# Patient Record
Sex: Female | Born: 1960
Health system: Southern US, Community
[De-identification: ages and names within clinical notes are randomized; demographics above are authoritative.]

## PROBLEM LIST (undated history)

## (undated) DIAGNOSIS — I10 Essential (primary) hypertension: Secondary | ICD-10-CM

## (undated) DIAGNOSIS — J45909 Unspecified asthma, uncomplicated: Secondary | ICD-10-CM

## (undated) DIAGNOSIS — G839 Paralytic syndrome, unspecified: Secondary | ICD-10-CM

## (undated) DIAGNOSIS — Z789 Other specified health status: Secondary | ICD-10-CM

## (undated) DIAGNOSIS — Z8489 Family history of other specified conditions: Secondary | ICD-10-CM

## (undated) HISTORY — DX: Essential (primary) hypertension: I10

## (undated) HISTORY — PX: BACK SURGERY: SHX140

---

## 1998-03-17 ENCOUNTER — Encounter: Admission: RE | Admit: 1998-03-17 | Discharge: 1998-03-17 | Payer: Self-pay | Admitting: Family Medicine

## 1999-09-07 ENCOUNTER — Other Ambulatory Visit: Admission: RE | Admit: 1999-09-07 | Discharge: 1999-09-07 | Payer: Self-pay | Admitting: Obstetrics and Gynecology

## 2000-08-14 ENCOUNTER — Other Ambulatory Visit: Admission: RE | Admit: 2000-08-14 | Discharge: 2000-08-14 | Payer: Self-pay | Admitting: Obstetrics and Gynecology

## 2000-08-14 ENCOUNTER — Encounter (INDEPENDENT_AMBULATORY_CARE_PROVIDER_SITE_OTHER): Payer: Self-pay | Admitting: Specialist

## 2002-07-02 ENCOUNTER — Other Ambulatory Visit: Admission: RE | Admit: 2002-07-02 | Discharge: 2002-07-02 | Payer: Self-pay | Admitting: Obstetrics and Gynecology

## 2003-07-08 ENCOUNTER — Other Ambulatory Visit: Admission: RE | Admit: 2003-07-08 | Discharge: 2003-07-08 | Payer: Self-pay | Admitting: Obstetrics and Gynecology

## 2003-08-04 ENCOUNTER — Ambulatory Visit (HOSPITAL_COMMUNITY): Admission: RE | Admit: 2003-08-04 | Discharge: 2003-08-04 | Payer: Self-pay | Admitting: Obstetrics and Gynecology

## 2003-08-04 ENCOUNTER — Encounter: Payer: Self-pay | Admitting: Obstetrics and Gynecology

## 2004-05-20 ENCOUNTER — Encounter: Admission: RE | Admit: 2004-05-20 | Discharge: 2004-08-18 | Payer: Self-pay | Admitting: Internal Medicine

## 2005-01-05 ENCOUNTER — Other Ambulatory Visit: Admission: RE | Admit: 2005-01-05 | Discharge: 2005-01-05 | Payer: Self-pay | Admitting: Obstetrics and Gynecology

## 2005-08-22 ENCOUNTER — Ambulatory Visit (HOSPITAL_COMMUNITY): Admission: RE | Admit: 2005-08-22 | Discharge: 2005-08-22 | Payer: Self-pay | Admitting: Internal Medicine

## 2005-08-22 ENCOUNTER — Ambulatory Visit: Payer: Self-pay | Admitting: Internal Medicine

## 2005-08-23 ENCOUNTER — Ambulatory Visit: Payer: Self-pay | Admitting: Internal Medicine

## 2005-09-11 ENCOUNTER — Ambulatory Visit (HOSPITAL_COMMUNITY): Admission: RE | Admit: 2005-09-11 | Discharge: 2005-09-11 | Payer: Self-pay | Admitting: *Deleted

## 2005-10-13 ENCOUNTER — Ambulatory Visit: Payer: Self-pay | Admitting: Internal Medicine

## 2005-11-03 ENCOUNTER — Ambulatory Visit: Payer: Self-pay | Admitting: Internal Medicine

## 2006-01-08 ENCOUNTER — Other Ambulatory Visit: Admission: RE | Admit: 2006-01-08 | Discharge: 2006-01-08 | Payer: Self-pay | Admitting: Obstetrics and Gynecology

## 2006-04-04 ENCOUNTER — Ambulatory Visit: Payer: Self-pay | Admitting: Family Medicine

## 2006-12-10 ENCOUNTER — Ambulatory Visit: Payer: Self-pay | Admitting: Internal Medicine

## 2007-01-18 ENCOUNTER — Ambulatory Visit: Payer: Self-pay | Admitting: Internal Medicine

## 2007-07-19 ENCOUNTER — Ambulatory Visit: Payer: Self-pay | Admitting: Internal Medicine

## 2007-08-02 ENCOUNTER — Encounter: Payer: Self-pay | Admitting: *Deleted

## 2007-08-02 DIAGNOSIS — G43909 Migraine, unspecified, not intractable, without status migrainosus: Secondary | ICD-10-CM | POA: Insufficient documentation

## 2008-01-08 ENCOUNTER — Encounter (INDEPENDENT_AMBULATORY_CARE_PROVIDER_SITE_OTHER): Payer: Self-pay | Admitting: *Deleted

## 2008-01-20 ENCOUNTER — Encounter: Admission: RE | Admit: 2008-01-20 | Discharge: 2008-01-20 | Payer: Self-pay | Admitting: Obstetrics and Gynecology

## 2008-03-23 ENCOUNTER — Ambulatory Visit: Payer: Self-pay | Admitting: Internal Medicine

## 2008-03-23 DIAGNOSIS — R209 Unspecified disturbances of skin sensation: Secondary | ICD-10-CM | POA: Insufficient documentation

## 2008-03-23 DIAGNOSIS — G5600 Carpal tunnel syndrome, unspecified upper limb: Secondary | ICD-10-CM | POA: Insufficient documentation

## 2008-07-24 ENCOUNTER — Ambulatory Visit: Payer: Self-pay | Admitting: Internal Medicine

## 2008-07-24 DIAGNOSIS — L659 Nonscarring hair loss, unspecified: Secondary | ICD-10-CM | POA: Insufficient documentation

## 2008-07-24 DIAGNOSIS — R030 Elevated blood-pressure reading, without diagnosis of hypertension: Secondary | ICD-10-CM | POA: Insufficient documentation

## 2009-09-09 ENCOUNTER — Ambulatory Visit: Payer: Self-pay | Admitting: Internal Medicine

## 2009-09-09 DIAGNOSIS — M25519 Pain in unspecified shoulder: Secondary | ICD-10-CM | POA: Insufficient documentation

## 2009-10-26 ENCOUNTER — Ambulatory Visit: Payer: Self-pay | Admitting: Sports Medicine

## 2009-10-29 ENCOUNTER — Telehealth: Payer: Self-pay | Admitting: Internal Medicine

## 2009-11-09 ENCOUNTER — Ambulatory Visit: Payer: Self-pay | Admitting: Sports Medicine

## 2009-11-09 DIAGNOSIS — M7500 Adhesive capsulitis of unspecified shoulder: Secondary | ICD-10-CM | POA: Insufficient documentation

## 2009-12-01 ENCOUNTER — Ambulatory Visit: Payer: Self-pay | Admitting: Sports Medicine

## 2010-01-04 ENCOUNTER — Ambulatory Visit: Payer: Self-pay | Admitting: Sports Medicine

## 2010-01-31 ENCOUNTER — Ambulatory Visit: Payer: Self-pay | Admitting: Sports Medicine

## 2010-03-07 ENCOUNTER — Ambulatory Visit: Payer: Self-pay | Admitting: Sports Medicine

## 2010-12-29 NOTE — Assessment & Plan Note (Signed)
Summary: F/U,[facility]   Vital Signs:  Patient profile:   50 year old female BP sitting:   115 / 79  Vitals Entered By: Lillia Pauls CMA (March 07, 2010 8:41 AM)  History of Present Illness: Morning left shoulder is still stiff Takes tramadol - usually 1 daily/ on bad days 2 now w good movement now lifting easy weights no night pain now No interference w ADLs  Allergies: 1)  ! Stadol (Butorphanol Tartrate) 2)  ! Tetracycline Hcl (Tetracycline Hcl) 3)  ! Propranolol Hcl (Propranolol Hcl) 4)  ! Maxzide-25 (Triamterene-Hctz) 5)  ! Tetracycline Hcl (Tetracycline Hcl)  Physical Exam  General:  Well-developed,well-nourished,in no acute distress; alert,appropriate and cooperative throughout examination Msk:  Left Shoulder Inspection reveals no abnormalities or assymetry; no atrophy noted; palpation is unremarkable;  ROM is full in all planes. specific strength testing of Rotator cuff mm reveals good strength throughout; no signs of impingement; speeds and yergason's tests normal;  no labral pathology noted; norm scapular function observed.  negative painful arc and no drop arm sign.  only motio limitation  is back scratch is 1 vert less on left slight impingemtn on hawkins    Impression & Recommendations:  Problem # 1:  SHOULDER PAIN (ICD-719.41)  Her updated medication list for this problem includes:    Tramadol Hcl 50 Mg Tabs (Tramadol hcl) .Marland Kitchen... 1 by mouth every 6 hours as needed pain    Zipsor 25 Mg Caps (Diclofenac potassium) .Marland Kitchen... 1 by mouth two times a day pc as needed pain  this is down to 4/10 on worst days and most times 1/10  Problem # 2:  ADHESIVE CAPSULITIS, LEFT (ICD-726.0) she has resolved this and now has good movement  keep up exercises at least 3x week  as needed clinic return  Complete Medication List: 1)  Benicar 20 Mg Tabs (Olmesartan medoxomil) .... 1/2 once daily 2)  Vitamin D3 1000 Unit Tabs (Cholecalciferol) .Marland Kitchen.. 1 by mouth daily 3)  Tramadol Hcl 50  Mg Tabs (Tramadol hcl) .Marland Kitchen.. 1 by mouth every 6 hours as needed pain 4)  Zipsor 25 Mg Caps (Diclofenac potassium) .Marland Kitchen.. 1 by mouth two times a day pc as needed pain 5)  Bcp  .... Once daily 6)  Amitriptyline Hcl 10 Mg Tabs (Amitriptyline hcl) .Marland Kitchen.. 1-2 tabs by mouth qhs

## 2010-12-29 NOTE — Assessment & Plan Note (Signed)
Summary: shoulder ultra sound,mc   Vital Signs:  Patient profile:   50 year old female BP sitting:   108 / 70  Vitals Entered By: Lillia Pauls CMA (November 09, 2009 8:45 AM)  History of Present Illness: Pt presents for follow-up of her left shoulder pain. She has been able to do the ROM exercises but has found that they have slowly become painful. She still has pain at night and the shoulder is often throbbing. She does believe that the Tramadol is helping her pain but she is only taking it twice a day. She still has difficulty with overhead activities. She is here for the ultrasound to assess for a rotator cuff tear.   Allergies: 1)  ! Stadol (Butorphanol Tartrate) 2)  ! Tetracycline Hcl (Tetracycline Hcl) 3)  ! Propranolol Hcl (Propranolol Hcl) 4)  ! Maxzide-25 (Triamterene-Hctz) 5)  ! Tetracycline Hcl (Tetracycline Hcl)  Physical Exam  General:  alert, well-developed, and well-nourished.   Head:  normocephalic and atraumatic.   Msk:  Left Shoulder: No bruising, edema or bony abnormalities + TTP over the anterior shoulder but not over the [facility] joint Decreased forward flexion and abduction to only 105 degrees  Significant pain with empty can testing and drops arm Decreased strength with external rotation but good strength with resisted internal rotation + Neer's, + Cross over, + Hawkin's Able to put her hand up to her belt behind her back  Right Shoulder: No bony abnormalities, edema or bruising full ROM with forward flexion and abduction No TTP throughout Normal empty can, resisted internal and external rotation Can put her hand behind her back easily up to her upper lumbar spine Additional Exam:  U/S of her left shoulder showed the following 1. Possible small tear in supraspinatus which is not a complete tear 2. Teres minor, infraspinatus and subscapularis are intact 3. Subacromial bursitis with fluid 4. Normal AC joint 5. Mild impingment on dynamic testing 6. No biceps  pathology 7. Decreased motion of the supraspinatus during dynamic testing concerning for a frozen shoulder Images saved for documentation.   Impression & Recommendations:  Problem # 1:  SHOULDER PAIN (ICD-719.41) Assessment Unchanged  Concerning for decreased motion of supraspinatus and possible developing frozen shoulder. Patient consented for another steroid injection (subacromial) into the left shoulder  Consent obtained and verified. Sterile betadine prep. Furthur cleansed with alcohol. Topical analgesic spray: Ethyl chloride. Joint: Left shoulder Approached in typical fashion with: subacromial Completed without difficulty Meds:40 mg of kenalog and 7cc of 1% lidocaine Needle:25 guage Aftercare instructions and Red flags advised.  Continue gentle ROM exercises at home. Can continue tramadol but increase to up to 4 times per day for pain control. Return in 4-6 weeks for follow-up   Her updated medication list for this problem includes:    Tramadol Hcl 50 Mg Tabs (Tramadol hcl) .Marland Kitchen... 1 by mouth two times a day as needed pain    Zipsor 25 Mg Caps (Diclofenac potassium) .Marland Kitchen... 1 by mouth two times a day pc as needed pain    Her updated medication list for this problem includes:    Tramadol Hcl 50 Mg Tabs (Tramadol hcl) .Marland Kitchen... 1 by mouth two times a day as needed pain    Zipsor 25 Mg Caps (Diclofenac potassium) .Marland Kitchen... 1 by mouth two times a day pc as needed pain  Orders: US EXTREMITY NON-VASC REAL-TIME IMG (29528) Joint Aspirate / Injection, Large (41324) Kenalog 10 mg inj (M0102)  Problem # 2:  ADHESIVE CAPSULITIS, LEFT (ICD-726.0)  Marked limitation in at least 2 planes of motion no complete RC tear although she prob does have incomplete SST on left  I would progress her with serial injections q 3mow ROM exercises until improved and little night pain after that start some strength work and formal PT  Orders: US EXTREMITY NON-VASC REAL-TIME IMG (82956) Joint Aspirate  / Injection, Large (21308) Kenalog 10 mg inj (J3301)  Complete Medication List: 1)  Benicar 20 Mg Tabs (Olmesartan medoxomil) .... 1/2 once daily 2)  Vitamin D3 1000 Unit Tabs (Cholecalciferol) .Marland Kitchen.. 1 by mouth daily 3)  Tramadol Hcl 50 Mg Tabs (Tramadol hcl) .Marland Kitchen.. 1 by mouth two times a day as needed pain 4)  Zipsor 25 Mg Caps (Diclofenac potassium) .Marland Kitchen.. 1 by mouth two times a day pc as needed pain 5)  Bcp  .... Once daily

## 2010-12-29 NOTE — Miscellaneous (Signed)
Summary: Shoulder Injection/[facility]  Shoulder Injection/[facility]   Imported By: Sherian Rein 09/14/2009 14:02:50  _____________________________________________________________________  External Attachment:    Type:   Image     Comment:   External Document

## 2010-12-29 NOTE — Assessment & Plan Note (Signed)
Summary: SHOULDER PAIN/[facility]   Vital Signs:  Patient profile:   50 year old female Height:      61 inches Weight:      114 pounds BMI:     21.62 Temp:     98.5 degrees F oral Pulse rate:   73 / minute BP sitting:   96 / 64  (left arm)  Vitals Entered By: Tora Perches (September 09, 2009 4:19 PM)  Procedure Note  Injections: The patient complains of pain and inflammation. Consent signed: yes  Procedure # 1: joint injection    Region: lateral    Location: L shoulder    Technique: 24 g needle    Medication: 40 mg depomedrol    Anesthesia: 2.0 ml 1% lidocaine w/o epinephrine    Comment: Risks including but not limited by incomplete procedure, bleeding, infection, recurrence were discussed with the patient. Consent form was signed.   Cleaned and prepped with: alcohol and betadine Wound dressing: bandaid Instructions: ice Additional Instructions: Tol well, compl none, pain relieved.  CC: shoulder pain Is Patient Diabetic? No   CC:  shoulder pain.  History of Present Illness: C/o B shoulder pain L>>R x  6+ months  Pain is worse w/ROM - severe at times  Preventive Screening-Counseling & Management  Alcohol-Tobacco     Smoking Status: never  Current Medications (verified): 1)  Benicar 20 Mg  Tabs (Olmesartan Medoxomil) .... 1/2 Once Daily 2)  Bcp .... Once Daily 3)  Vitamin D3 1000 Unit  Tabs (Cholecalciferol) .Marland Kitchen.. 1 By Mouth Daily 4)  Cbc, Esr, Tsh, Cmet, Ua, Lipids 704.0 401.1 .... Thanks!  Allergies: 1)  ! Stadol (Butorphanol Tartrate) 2)  ! Tetracycline Hcl (Tetracycline Hcl) 3)  ! Propranolol Hcl (Propranolol Hcl) 4)  ! Maxzide-25 (Triamterene-Hctz) 5)  ! Tetracycline Hcl (Tetracycline Hcl)  Past History:  Past Medical History: Last updated: 03/23/2008 Hypertension  Social History: Last updated: 03/23/2008 Occupation: ICU secr. Married Never Smoked Regular exercise-yes  Review of Systems  The patient denies fever, dyspnea on exertion, and  abdominal pain.    Physical Exam  General:  Well-developed,well-nourished,in no acute distress; alert,appropriate and cooperative throughout examination Mouth:  Oral mucosa and oropharynx without lesions or exudates.  Teeth in good repair. Lungs:  Normal respiratory effort, chest expands symmetrically. Lungs are clear to auscultation, no crackles or wheezes. Heart:  Normal rate and regular rhythm. S1 and S2 normal without gallop, murmur, click, rub or other extra sounds. Msk:  L shoulder in subacr area tender to palp and w/ROM Neurologic:  No cranial nerve deficits noted. Station and gait are normal. Plantar reflexes are down-going bilaterally. DTRs are symmetrical throughout. Sensory, motor and coordinative functions appear intact. Skin:  Intact without suspicious lesions or rashes   Impression & Recommendations:  Problem # 1:  SHOULDER PAIN (ICD-719.41) L Assessment Deteriorated  The following medications were removed from the medication list:    Ibuprofen 600 Mg Tabs (Ibuprofen) .Marland Kitchen... As needed    Darvocet A500 100-500 Mg Tabs (Propoxyphene n-apap) ..... Once daily    Meloxicam 15 Mg Tabs (Meloxicam) .Marland Kitchen... 1/2 or one by mouth daily Her updated medication list for this problem includes:    Tramadol Hcl 50 Mg Tabs (Tramadol hcl) .Marland Kitchen... 1 by mouth two times a day as needed pain    Zipsor 25 Mg Caps (Diclofenac potassium) .Marland Kitchen... 1 by mouth two times a day pc as needed pain  Complete Medication List: 1)  Benicar 20 Mg Tabs (Olmesartan medoxomil) .... 1/2 once daily  2)  Vitamin D3 1000 Unit Tabs (Cholecalciferol) .Marland Kitchen.. 1 by mouth daily 3)  Tramadol Hcl 50 Mg Tabs (Tramadol hcl) .Marland Kitchen.. 1 by mouth two times a day as needed pain 4)  Zipsor 25 Mg Caps (Diclofenac potassium) .Marland Kitchen.. 1 by mouth two times a day pc as needed pain 5)  Bcp  .... Once daily  Other Orders: Joint Aspirate / Injection, Large (20610) Depo- Medrol 40mg  (J1030)  Patient Instructions: 1)  Zipsor 25 mg two times a day  2)   Use stretching exercises that I have provided (15 min. or longer every day) 3)  Call if you are not better in a reasonable amount of time or if worse.  Prescriptions: ZIPSOR 25 MG CAPS (DICLOFENAC POTASSIUM) 1 by mouth two times a day pc as needed pain  #60 x 1   Entered and Authorized by:   Tresa Garter MD   Signed by:   Tresa Garter MD on 09/09/2009   Method used:   Print then Give to Patient   RxID:   [id number] TRAMADOL HCL 50 MG  TABS (TRAMADOL HCL) 1 by mouth two times a day as needed pain  #60 x 2   Entered and Authorized by:   Tresa Garter MD   Signed by:   Tresa Garter MD on 09/09/2009   Method used:   Print then Give to Patient   RxID:   [id number]

## 2010-12-29 NOTE — Assessment & Plan Note (Signed)
Summary: [facility]   Vital Signs:  Patient profile:   50 year old female BP sitting:   141 / 100  Vitals Entered By: Lillia Pauls CMA (October 26, 2009 11:51 AM)  History of Present Illness: Pt presents with left shoulder pain. She recently saw her PCP on 09/09/2009 and had a steroid injection which did not help her. She has had pain now since September and has been limited in her movements especially with putting her left arm behind her back or above her head. She has pain at night if she tries to sleep on her left shoulder. She was given a prescription for diclofenac potassium which was helpful as well as for tramadol but she did not keep the tramadol. She uses a heating pad which is also helpful. Denies any recent trauma or history of left shoulder surgeries.   Allergies: 1)  ! Stadol (Butorphanol Tartrate) 2)  ! Tetracycline Hcl (Tetracycline Hcl) 3)  ! Propranolol Hcl (Propranolol Hcl) 4)  ! Maxzide-25 (Triamterene-Hctz) 5)  ! Tetracycline Hcl (Tetracycline Hcl)  Physical Exam  General:  alert and well-developed.   Head:  normocephalic and atraumatic.   Msk:  Shoulder: LEFT Inspection reveals no abnormalities, atrophy or asymmetry. + TTP along bicepital groove but not over the [facility] joint She has forward flexion minus 15 degrees and abduction minus 20 degrees.  Pain at 90 degrees Rotator cuff strength is abnormal with weakness with resisted internal and external rotation as well as with push off testing behind her back Positive signs of impingement with a postive Neer and Hawkin's tests Fatigues with empty can testing Speeds tests was painful. No labral pathology noted with negative Obrien's, negative clunk and good stability.  Shoulder: RIGHT Inspection reveals no abnormalities, atrophy or asymmetry. Palpation is normal with no tenderness over AC joint or bicipital groove. ROM is full in all planes. Rotator cuff strength normal throughout. No signs of  impingement with negative Neer and Hawkin's tests, empty can. Speeds testing is normal. No labral pathology noted with negative Obrien's, negative clunk and good stability.          Impression & Recommendations:  Problem # 1:  SHOULDER PAIN (ICD-719.41) Assessment New 1. Will bring her back for an ultrasound of her shoulder to rule out a partial rotator cuff tear. 2. Continue Zipsor adn Tramadol. 3. Ice for 20 minutes daily 4. Given a handout of ROM exercises to do daily to keep her from developing a frozen shoulder  Her updated medication list for this problem includes:    Tramadol Hcl 50 Mg Tabs (Tramadol hcl) .Marland Kitchen... 1 by mouth two times a day as needed pain    Zipsor 25 Mg Caps (Diclofenac potassium) .Marland Kitchen... 1 by mouth two times a day pc as needed pain  Complete Medication List: 1)  Benicar 20 Mg Tabs (Olmesartan medoxomil) .... 1/2 once daily 2)  Vitamin D3 1000 Unit Tabs (Cholecalciferol) .Marland Kitchen.. 1 by mouth daily 3)  Tramadol Hcl 50 Mg Tabs (Tramadol hcl) .Marland Kitchen.. 1 by mouth two times a day as needed pain 4)  Zipsor 25 Mg Caps (Diclofenac potassium) .Marland Kitchen.. 1 by mouth two times a day pc as needed pain 5)  Bcp  .... Once daily  Patient Instructions: 1)  Please come back in 2 weeks from today at 8:30am for a shoulder ultrasound. 2)  Please use the Zipsor for your shoulder as an anti-inflammatory or use Aleve or ibuprofen. 3)  Take the Tramadol for pain when it gets severe. 4)  Do the home exercises daily.
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
## 2010-12-29 NOTE — Assessment & Plan Note (Signed)
Summary: 4 mth fu-$50-stc   Vital Signs:  Patient Profile:   50 Years Old Female Weight:      112 pounds Temp:     97.4 degrees F oral Pulse rate:   68 / minute BP sitting:   104 / 66  Vitals Entered By: Tora Perches (July 24, 2008 3:22 PM)                 Chief Complaint:  Multiple medical problems or concerns.  History of Present Illness: F/u HTN C/o hair loss x wks, front    Current Allergies (reviewed today): ! TETRACYCLINE HCL (TETRACYCLINE HCL)  Past Medical History:    Reviewed history from 03/23/2008 and no changes required:       Hypertension   Family History:    Reviewed history from 03/23/2008 and no changes required:       Family History Hypertension  Social History:    Reviewed history from 03/23/2008 and no changes required:       Occupation: ICU secr.       Married       Never Smoked       Regular exercise-yes    Review of Systems  The patient denies fever, chest pain, syncope, dyspnea on exertion, and headaches.     Physical Exam  General:     Well-developed,well-nourished,in no acute distress; alert,appropriate and cooperative throughout examination Nose:     External nasal examination shows no deformity or inflammation. Nasal mucosa are pink and moist without lesions or exudates. Mouth:     Oral mucosa and oropharynx without lesions or exudates.  Teeth in good repair. Neck:     No deformities, masses, or tenderness noted. Lungs:     Normal respiratory effort, chest expands symmetrically. Lungs are clear to auscultation, no crackles or wheezes. Heart:     Normal rate and regular rhythm. S1 and S2 normal without gallop, murmur, click, rub or other extra sounds. Abdomen:     Bowel sounds positive,abdomen soft and non-tender without masses, organomegaly or hernias noted. Msk:     No deformity or scoliosis noted of thoracic or lumbar spine.   Pulses:     R and L carotid,radial,femoral,dorsalis pedis and posterior tibial pulses are  full and equal bilaterally Extremities:     No clubbing, cyanosis, edema, or deformity noted with normal full range of motion of all joints.   Neurologic:     No cranial nerve deficits noted. Station and gait are normal. Plantar reflexes are down-going bilaterally. DTRs are symmetrical throughout. Sensory, motor and coordinative functions appear intact. Skin:     Intact without suspicious lesions or rashes Psych:     Cognition and judgment appear intact. Alert and cooperative with normal attention span and concentration. No apparent delusions, illusions, hallucinations    Impression & Recommendations:  Problem # 1:  HAIR LOSS (ICD-704.00) Assessment: New Poss hereditery Less likely Benicar  Problem # 2:  CARPAL TUNNEL SYNDROME (ICD-354.0) Assessment: Improved Prevention discussed  Problem # 3:  HYPERTENSION (ICD-401.9) Assessment: Improved  Her updated medication list for this problem includes:    Benicar 20 Mg Tabs (Olmesartan medoxomil) .Marland Kitchen... 1  tablet by mouth daily   Complete Medication List: 1)  Meloxicam 15 Mg Tabs (Meloxicam) .... 1/2 or one by mouth daily 2)  Vitamin D3 1000 Unit Tabs (Cholecalciferol) .Marland Kitchen.. 1 by mouth daily 3)  Benicar 20 Mg Tabs (Olmesartan medoxomil) .Marland Kitchen.. 1  tablet by mouth daily 4)  Clobex 0.05 % Sham (Clobetasol  propionate) .... 2-3 times a wk as dirr prn 5)  Cbc, Esr, Tsh, Cmet, Ua, Lipids 704.0 401.1  .... Thanks!   Patient Instructions: 1)  Try Bystolic one a day 5 mg x 1 mo instead of Benicar   Prescriptions: CBC, ESR, TSH, CMET, UA, LIPIDS 704.0 401.1 Thanks!  #x x 0   Entered and Authorized by:   Tresa Garter MD   Signed by:   Tresa Garter MD on 07/24/2008   Method used:   Print then Give to Patient   RxID:   [id number] BENICAR 20 MG TABS (OLMESARTAN MEDOXOMIL) 1  tablet by mouth daily  #90 x 3   Entered and Authorized by:   Tresa Garter MD   Signed by:   Tresa Garter MD on 07/24/2008   Method  used:   Print then Give to Patient   RxID:   [id number] CLOBEX 0.05 % SHAM (CLOBETASOL PROPIONATE) 2-3 times a wk as dirr prn  #120 g x 2   Entered and Authorized by:   Tresa Garter MD   Signed by:   Tresa Garter MD on 07/24/2008   Method used:   Print then Give to Patient   RxID:   [id number]  ]

## 2010-12-29 NOTE — Assessment & Plan Note (Signed)
Summary: F/U,MC   Vital Signs:  Patient profile:   50 year old female Pulse rate:   71 / minute BP sitting:   119 / 78  History of Present Illness: Overall her left shoulder pain is improving and she has better ROM. She has been able to put her left hand behind her back now up above her belt. She is taking Tramadol up to four times per day plus her ibuprofen when the shoulder starts to throb. Able to sleep at night now that pain has improved. She has some pain with extreme forward flexion only. Not restricted by her pain except with lifting heavy weights with her left shoulder. Able to do her daily exercises with 1-2 lb weights.    Allergies: 1)  ! Stadol (Butorphanol Tartrate) 2)  ! Tetracycline Hcl (Tetracycline Hcl) 3)  ! Propranolol Hcl (Propranolol Hcl) 4)  ! Maxzide-25 (Triamterene-Hctz) 5)  ! Tetracycline Hcl (Tetracycline Hcl)  Physical Exam  General:  alert and well-developed.   Head:  normocephalic and atraumatic.   Msk:  Left Shoulder: No bony abnormalities, edema or bruising Active flexion to 160 (passive is still difficult to get to 180 degrees because of tight posterior capsule) Active abduction to 180 + TTP over the biceps (mild) Neg empty can test 5/5 strength with resisted internal and external rotation + Neer's testing, + Speeds (mild) Neg Hawkin's and cross over Can get her hand to L4 behind her back  Right Shoulder: No bony abnormalities, edema or bruising Full ROM without pain 5/5 strength with resisted internal and external rotation Neg empty can, cross over, Neer's, Hawkin's and speeds No TTP throughout    Impression & Recommendations:  Problem # 1:  ADHESIVE CAPSULITIS, 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 1 by mouth two times a day as needed pain    Zipsor 25 Mg Caps (Diclofenac potassium) .Marland Kitchen... 1 by mouth two times a day pc as needed pain  Complete Medication List: 1)  Benicar 20 Mg Tabs (Olmesartan medoxomil) .... 1/2 once daily 2)  Vitamin D3 1000 Unit Tabs (Cholecalciferol) .Marland Kitchen.. 1 by mouth daily 3)  Tramadol Hcl 50 Mg Tabs (Tramadol hcl) .Marland Kitchen.. 1 by mouth two times a day as needed pain 4)  Zipsor 25 Mg Caps (Diclofenac potassium) .Marland Kitchen.. 1 by mouth two times a day pc as needed pain 5)  Bcp  .... Once daily 6)  Amitriptyline Hcl 10 Mg Tabs (Amitriptyline hcl) .Marland Kitchen.. 1-2 tabs by mouth qhs  Patient Instructions: 1)  Do the daily exercises now with the Theraband. 2)  You can call us back for a refill of your Tramadol. 3)  We will see you back in one month.

## 2010-12-29 NOTE — Assessment & Plan Note (Signed)
Summary: FU L SHOULDER PAIN   Vital Signs:  Patient profile:   50 year old female BP sitting:   130 / 80  Vitals Entered By: Lillia Pauls CMA (January 31, 2010 8:44 AM)  History of Present Illness:  50 y/o F with frozen Left shoulder. States that it is better because she is able to move it.  Taking combination of iIbuprofen 200mg , Tramadol 50mg , Tylenol has helped.  Tramadol is the only med that she takes everyday, sometimes bid.  Tramadol Rx ran out end of Feb, but she given some tramadol by friends.  Pain in worse at night and she is taking Amitryptilline, which seems to help.  ROM is better, but pt is not using it much.  When she does use the arm, it hurts.  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  She carried about 110 lbs of chicken that she had to carry.  After this she had constant pain, graded 5 out of 10.   Now getting trapzius pain in Right side.     Allergies: 1)  ! Stadol (Butorphanol Tartrate) 2)  ! Tetracycline Hcl (Tetracycline Hcl) 3)  ! Propranolol Hcl (Propranolol Hcl) 4)  ! Maxzide-25 (Triamterene-Hctz) 5)  ! Tetracycline Hcl (Tetracycline Hcl)   Shoulder/Elbow Exam  General:    Well-developed, well-nourished, normal body habitus; no deformities, normal grooming.    Shoulder Exam:    Left:    Swelling:  no    Erythema:  no    No bony abnormalities, edema or bruising Active flexion to 160 (passive is still difficult to get to 180 degrees because of tight posterior capsule) Active abduction to 180 + TTP over the biceps (mild) Neg empty can test 5/5 strength with resisted internal and external rotation + Neer's testing, + Speeds (mild) Neg Hawkin's and cross over Can get her hand to L4 behind her back  Right Shoulder: No bony abnormalities, edema or bruising Full ROM without pain 5/5 strength with resisted internal and external rotation Neg empty can, cross over, Neer's, Hawkin's and speeds No TTP  throughout    Impression & Recommendations:  Problem # 1:  ADHESIVE CAPSULITIS, 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 Exercises: continue motion exercises.  start walking fingers up wall.  Add resistance with theraband. Take arm behind her.  Keep pain level <3 by taking tramadol q6 prn.  Use theraband twice daily .  Use motion exercises more often.   Use good posture when doing these exercises and at work.    This is nice progress but cannot rush and expect some pain if she overdoes this  Complete Medication List: 1)  Benicar 20 Mg Tabs (Olmesartan medoxomil) .... 1/2 once daily 2)  Vitamin D3 1000 Unit Tabs (Cholecalciferol) .Marland Kitchen.. 1 by mouth daily 3)  Tramadol Hcl 50 Mg Tabs (Tramadol hcl) .Marland Kitchen.. 1 by mouth every 6 hours as needed pain 4)  Zipsor 25 Mg Caps (Diclofenac potassium) .Marland Kitchen.. 1 by mouth two times a day pc as needed pain 5)  Bcp  .... Once daily 6)  Amitriptyline Hcl 10 Mg Tabs (Amitriptyline hcl) .Marland Kitchen.. 1-2 tabs by mouth qhs Prescriptions: TRAMADOL HCL 50 MG  TABS (TRAMADOL HCL) 1 by mouth every 6 hours as needed pain  #100 x 2   Entered by:   Angeline Slim MD   Authorized by:   Enid Baas MD   Signed by:   Angeline Slim  MD on 01/31/2010   Method used:   Electronically to        [facility]* (retail)       [name]       [address] Shipping/mailing       [place], [place]  [address]       Ph: [phone]       Fax: [phone]   RxID:   [id number]

## 2010-12-29 NOTE — Letter (Signed)
Summary: Primary Care Appointment Letter  [facility]  [address] [place], [place] [address]   Phone: [phone]  Fax: [phone]    01/08/2008 MRN: [id number]  Kelly Wall [address] [place], [place]  [address]  Dear Ms. Garrison Columbus,   Your Primary Care Physician  has indicated that:    _______it is time to schedule an appointment.    _______you missed your appointment on______ and need to call and          reschedule.    _______you need to have lab work done.    _______you need to schedule an appointment discuss lab or test results.    ___X____you need to call to reschedule your appointment that is                                scheduled on July 17, 2008 with Dr. Posey Rea.  Please call         to update your phone number.     Please call our office as soon as possible. Our phone number is [phone]. Please press option 1. Our office is open 8a-12noon and 1p-5p, Monday through Friday.    Thank you,     Primary Care Scheduler

## 2010-12-29 NOTE — Progress Notes (Signed)
Summary: Tramadol RX  Phone Note Call from Patient   Summary of Call: Pt LM requesting refill of Tramadol. Per pt she lost the script and needs a new one. Pt asks if it can be called in to [facility] at [phone]. Please advise. Initial call taken by: Lucious Groves, CMA,  October 29, 2009 12:11 PM  Follow-up for Phone Call        OK Follow-up by: Tresa Garter MD,  October 29, 2009 1:10 PM  Additional Follow-up for Phone Call Additional follow up Details #1::        Pt informed  Additional Follow-up by: Lamar Sprinkles, CMA,  October 29, 2009 3:01 PM    Prescriptions: TRAMADOL HCL 50 MG  TABS (TRAMADOL HCL) 1 by mouth two times a day as needed pain  #60 x 2   Entered by:   Lamar Sprinkles, CMA   Authorized by:   Tresa Garter MD   Signed by:   Lamar Sprinkles, CMA on 10/29/2009   Method used:   Electronically to        [facility]* (retail)       [address]       [address] Shipping/mailing       [place], [place]  [address]       Ph: [phone]       Fax: [phone]   RxID:   [id number]

## 2010-12-29 NOTE — Assessment & Plan Note (Signed)
Summary: F/U,MC   Vital Signs:  Patient profile:   50 year old female BP sitting:   120 / 78  Vitals Entered By: Lillia Pauls CMA (December 01, 2009 8:46 AM)  History of Present Illness: Reports to f/u left adhesive capsulitis. Initially responded well to glenohumeral injection adminstered during LOV. Fell 3 days ago. No popping sensation. Some increased left shoulder pain in the interim.  Pain worst on movement and decreased on rest. Some night-time pain which occasionally awakens her from sleep. Taking tramadol, tylenol, and ibuprofen for pain relief. Performing home pendulum and chair exercises as instructed during LOV.  Allergies: 1)  ! Stadol (Butorphanol Tartrate) 2)  ! Tetracycline Hcl (Tetracycline Hcl) 3)  ! Propranolol Hcl (Propranolol Hcl) 4)  ! Maxzide-25 (Triamterene-Hctz) 5)  ! Tetracycline Hcl (Tetracycline Hcl)  Physical Exam  General:  Well-developed,well-nourished,in no acute distress; alert,appropriate and cooperative throughout examination Msk:  LEFT SHOULDER: No ttp. Flex- 110 active; 120 passive w/pain Abd- 90 active; 100 passive w/pain ER- 5 active/passive with pain IR- back pocket  5/5 strength throughout ROM testing.     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 - Start amitriptyline and gradually work up to 2 tabs by mouth qHS as tolerated. Medication precautions provided to the patient. - Continue tramadol, tylenol per OTC instructions, and ibuprofen per OTC instructions. - "Adhesive Capsulitis" handout, courtesy of http://www.[employer].tn D7458960 c.html, provided to the patient. - RTC in 1 month or sooner as needed interim re-assessment. Will consider repeat GH injection +/- PT referall during this visit.  Complete Medication List: 1)  Benicar 20 Mg Tabs (Olmesartan medoxomil) .... 1/2 once daily 2)  Vitamin D3  1000 Unit Tabs (Cholecalciferol) .Marland Kitchen.. 1 by mouth daily 3)  Tramadol Hcl 50 Mg Tabs (Tramadol hcl) .Marland Kitchen.. 1 by mouth two times a day as needed pain 4)  Zipsor 25 Mg Caps (Diclofenac potassium) .Marland Kitchen.. 1 by mouth two times a day pc as needed pain 5)  Bcp  .... Once daily 6)  Amitriptyline Hcl 10 Mg Tabs (Amitriptyline hcl) .Marland Kitchen.. 1-2 tabs by mouth qhs Prescriptions: AMITRIPTYLINE HCL 10 MG TABS (AMITRIPTYLINE HCL) 1-2 tabs by mouth qHS  #60 x 0   Entered and Authorized by:   Valarie Merino MD   Signed by:   Valarie Merino MD on 12/01/2009   Method used:   Electronically to        [facility]* (retail)       [address]       [address] Shipping/mailing       [place], [place]  [address]       Ph: [phone]       Fax: [phone]   RxID:   [id number]

## 2011-06-09 ENCOUNTER — Encounter: Payer: Self-pay | Admitting: Internal Medicine

## 2011-06-20 ENCOUNTER — Ambulatory Visit (INDEPENDENT_AMBULATORY_CARE_PROVIDER_SITE_OTHER): Payer: 59 | Admitting: Internal Medicine

## 2011-06-20 ENCOUNTER — Encounter: Payer: Self-pay | Admitting: Internal Medicine

## 2011-06-20 DIAGNOSIS — Z0000 Encounter for general adult medical examination without abnormal findings: Secondary | ICD-10-CM

## 2011-06-20 DIAGNOSIS — R202 Paresthesia of skin: Secondary | ICD-10-CM

## 2011-06-20 DIAGNOSIS — R209 Unspecified disturbances of skin sensation: Secondary | ICD-10-CM

## 2011-06-20 DIAGNOSIS — R21 Rash and other nonspecific skin eruption: Secondary | ICD-10-CM

## 2011-06-20 DIAGNOSIS — I10 Essential (primary) hypertension: Secondary | ICD-10-CM

## 2011-06-20 MED ORDER — TRIAMCINOLONE ACETONIDE 0.1 % EX CREA: TOPICAL_CREAM | Freq: Two times a day (BID) | CUTANEOUS | Status: AC

## 2011-06-23 ENCOUNTER — Other Ambulatory Visit: Payer: Self-pay | Admitting: Internal Medicine

## 2011-06-24 ENCOUNTER — Telehealth: Payer: Self-pay | Admitting: Internal Medicine

## 2011-06-24 LAB — CBC WITH DIFFERENTIAL/PLATELET
Basophils Absolute: 0 10*3/uL (ref 0.0–0.1)
Basophils Relative: 0 % (ref 0–1)
Eosinophils Absolute: 0.1 10*3/uL (ref 0.0–0.7)
Eosinophils Relative: 2 % (ref 0–5)
HCT: 42.8 % (ref 39.0–52.0)
Hemoglobin: 14.3 g/dL (ref 13.0–17.0)
Lymphocytes Relative: 36 % (ref 12–46)
Lymphs Abs: 1.8 10*3/uL (ref 0.7–4.0)
MCH: 31.1 pg (ref 26.0–34.0)
MCHC: 33.4 g/dL (ref 30.0–36.0)
MCV: 93 fL (ref 78.0–100.0)
Monocytes Absolute: 0.4 10*3/uL (ref 0.1–1.0)
Monocytes Relative: 8 % (ref 3–12)
Neutro Abs: 2.6 10*3/uL (ref 1.7–7.7)
Neutrophils Relative %: 54 % (ref 43–77)
Platelets: 252 10*3/uL (ref 150–400)
RBC: 4.6 MIL/uL (ref 4.22–5.81)
RDW: 12.8 % (ref 11.5–15.5)
WBC: 4.9 10*3/uL (ref 4.0–10.5)

## 2011-06-24 LAB — URINALYSIS, MICROSCOPIC ONLY
Casts: NONE SEEN
Crystals: NONE SEEN

## 2011-06-24 LAB — LIPID PANEL
Cholesterol: 179 mg/dL (ref 0–200)
HDL: 57 mg/dL (ref >39)
LDL Cholesterol: 115 mg/dL — ABNORMAL HIGH (ref 0–99)
Total CHOL/HDL Ratio: 3.1 Ratio
Triglycerides: 37 mg/dL (ref <150)
VLDL: 7 mg/dL (ref 0–40)

## 2011-06-24 LAB — URINALYSIS, ROUTINE W REFLEX MICROSCOPIC
Bilirubin Urine: NEGATIVE
Glucose, UA: NEGATIVE mg/dL
Ketones, ur: NEGATIVE mg/dL
Nitrite: NEGATIVE
Protein, ur: NEGATIVE mg/dL
Specific Gravity, Urine: <1.005 — ABNORMAL LOW (ref 1.005–1.030)
Urobilinogen, UA: 0.2 mg/dL (ref 0.0–1.0)
pH: 6.5 (ref 5.0–8.0)

## 2011-06-24 LAB — COMPREHENSIVE METABOLIC PANEL
ALT: 20 U/L (ref 0–53)
AST: 26 U/L (ref 0–37)
Albumin: 4.7 g/dL (ref 3.5–5.2)
Alkaline Phosphatase: 52 U/L (ref 39–117)
BUN: 10 mg/dL (ref 6–23)
CO2: 26 mEq/L (ref 19–32)
Calcium: 10 mg/dL (ref 8.4–10.5)
Chloride: 98 mEq/L (ref 96–112)
Creat: 0.68 mg/dL (ref 0.50–1.35)
Glucose, Bld: 101 mg/dL — ABNORMAL HIGH (ref 70–99)
Potassium: 4.1 mEq/L (ref 3.5–5.3)
Sodium: 134 mEq/L — ABNORMAL LOW (ref 135–145)
Total Bilirubin: 0.5 mg/dL (ref 0.3–1.2)
Total Protein: 7.6 g/dL (ref 6.0–8.3)

## 2011-06-24 LAB — VITAMIN B12: Vitamin B-12: 401 pg/mL (ref 211–911)

## 2011-06-24 LAB — TSH: TSH: 1.262 u[IU]/mL (ref 0.350–4.500)

## 2011-06-24 MED ORDER — CIPROFLOXACIN HCL 250 MG PO TABS: 250.0000 mg | ORAL_TABLET | Freq: Two times a day (BID) | ORAL | Status: AC

## 2011-06-24 NOTE — Telephone Encounter (Signed)
Misty Stanley, please, inform patient that all labs are normal except for UTI Take cipro Thx

## 2011-06-26 NOTE — Telephone Encounter (Signed)
Called patient and informed patient per MD

## 2011-06-27 ENCOUNTER — Encounter: Payer: Self-pay | Admitting: Internal Medicine

## 2011-06-27 DIAGNOSIS — Z0000 Encounter for general adult medical examination without abnormal findings: Secondary | ICD-10-CM | POA: Insufficient documentation

## 2011-06-27 DIAGNOSIS — R21 Rash and other nonspecific skin eruption: Secondary | ICD-10-CM | POA: Insufficient documentation

## 2011-06-27 NOTE — Progress Notes (Signed)
  Subjective:    Patient ID: Kelly Wall, female    DOB: 1961-11-11, 50 y.o.   MRN: [id number]  HPI   The patient is here for a wellness exam. The patient has been doing well overall without major physical or psychological issues going on lately. The patient needs to address  chronic hypertension that has been well controlled without medicines (has not been taking meds) - on diet.   Review of Systems  Constitutional: Negative for fever, chills, diaphoresis, activity change, appetite change, fatigue and unexpected weight change.  HENT: Negative for hearing loss, ear pain, congestion, sore throat, sneezing, mouth sores, neck pain, dental problem, voice change, postnasal drip and sinus pressure.   Eyes: Negative for pain and visual disturbance.  Respiratory: Negative for cough, chest tightness, wheezing and stridor.   Cardiovascular: Negative for chest pain, palpitations and leg swelling.  Gastrointestinal: Negative for nausea, vomiting, abdominal pain, blood in stool, abdominal distention and rectal pain.  Genitourinary: Negative for dysuria, hematuria, decreased urine volume, vaginal bleeding, vaginal discharge, difficulty urinating, vaginal pain and menstrual problem.  Musculoskeletal: Negative for back pain, joint swelling and gait problem.  Skin: Positive for rash (on forearms). Negative for color change and wound.  Neurological: Negative for dizziness, tremors, syncope, speech difficulty and light-headedness.  Hematological: Negative for adenopathy.  Psychiatric/Behavioral: Negative for suicidal ideas, hallucinations, behavioral problems, confusion, sleep disturbance, dysphoric mood and decreased concentration. The patient is not hyperactive.        Objective:   Physical Exam  Constitutional: She appears well-developed and well-nourished. No distress.  HENT:  Head: Normocephalic.  Right Ear: External ear normal.  Left Ear: External ear normal.  Nose: Nose normal.    Mouth/Throat: Oropharynx is clear and moist.  Eyes: Conjunctivae are normal. Pupils are equal, round, and reactive to light. Right eye exhibits no discharge. Left eye exhibits no discharge.  Neck: Normal range of motion. Neck supple. No JVD present. No tracheal deviation present. No thyromegaly present.  Cardiovascular: Normal rate, regular rhythm and normal heart sounds.   Pulmonary/Chest: No stridor. No respiratory distress. She has no wheezes.  Abdominal: Soft. Bowel sounds are normal. She exhibits no distension and no mass. There is no tenderness. There is no rebound and no guarding.  Musculoskeletal: She exhibits no edema and no tenderness.  Lymphadenopathy:    She has no cervical adenopathy.  Neurological: She displays normal reflexes. No cranial nerve deficit. She exhibits normal muscle tone. Coordination normal.  Skin: Rash (very faint eryth papular rash on B lateral forearms) noted. No erythema.  Psychiatric: She has a normal mood and affect. Her behavior is normal. Judgment and thought content normal.    Lab Results  Component Value Date   WBC 4.9 06/23/2011   HGB 14.3 06/23/2011   HCT 42.8 06/23/2011   PLT 252 06/23/2011   CHOL 179 06/23/2011   TRIG 37 06/23/2011   HDL 57 06/23/2011   ALT 20 03/05/8118   AST 26 06/23/2011   NA 134* 06/23/2011   K 4.1 06/23/2011   CL 98 06/23/2011   CREATININE 0.68 06/23/2011   BUN 10 06/23/2011   CO2 26 06/23/2011   TSH 1.262 06/23/2011         Assessment & Plan:

## 2011-06-27 NOTE — Assessment & Plan Note (Signed)
GYN appt is pending

## 2011-06-27 NOTE — Assessment & Plan Note (Signed)
Doing well off meds.

## 2011-09-01 ENCOUNTER — Other Ambulatory Visit (HOSPITAL_COMMUNITY): Payer: Self-pay | Admitting: Obstetrics and Gynecology

## 2011-09-01 DIAGNOSIS — Z1231 Encounter for screening mammogram for malignant neoplasm of breast: Secondary | ICD-10-CM

## 2011-09-04 ENCOUNTER — Ambulatory Visit (HOSPITAL_COMMUNITY): Payer: 59

## 2011-09-11 ENCOUNTER — Ambulatory Visit (HOSPITAL_COMMUNITY)
Admission: RE | Admit: 2011-09-11 | Discharge: 2011-09-11 | Disposition: A | Discharge status: Home / Self Care | Payer: 59 | Source: Ambulatory Visit | Attending: Obstetrics and Gynecology | Admitting: Obstetrics and Gynecology

## 2011-09-11 DIAGNOSIS — Z1231 Encounter for screening mammogram for malignant neoplasm of breast: Secondary | ICD-10-CM

## 2012-02-12 ENCOUNTER — Encounter: Payer: Self-pay | Admitting: Internal Medicine

## 2012-02-12 ENCOUNTER — Ambulatory Visit (INDEPENDENT_AMBULATORY_CARE_PROVIDER_SITE_OTHER): Payer: 59 | Admitting: Internal Medicine

## 2012-02-12 VITALS — BP 100/70 | HR 84 | Temp 97.9°F | Resp 16 | Wt 111.0 lb

## 2012-02-12 DIAGNOSIS — J019 Acute sinusitis, unspecified: Secondary | ICD-10-CM

## 2012-02-12 MED ORDER — CIPROFLOXACIN HCL 500 MG PO TABS: 500.0000 mg | ORAL_TABLET | Freq: Two times a day (BID) | ORAL | Status: AC

## 2012-02-12 MED ORDER — AMOXICILLIN 500 MG PO CAPS: 1000.0000 mg | ORAL_CAPSULE | Freq: Two times a day (BID) | ORAL | Status: AC

## 2012-02-12 NOTE — Assessment & Plan Note (Signed)
Amoxicillin x 10 d Afrin  I gave her a Rx for Cipro to take w/her to [place]

## 2012-02-12 NOTE — Patient Instructions (Signed)
Use over-the-counter  "cold" medicines  such as"Afrin" nasal spray for nasal congestion as directed instead. Use" Delsym" or" Robitussin" cough syrup varietis for cough.  You can use plain "Tylenol" or "Advil' for fever, chills and achyness.   

## 2012-02-12 NOTE — Progress Notes (Signed)
  Subjective:    Patient ID: Kelly Wall, female    DOB: Apr 18, 1961, 51 y.o.   MRN: [id number]  HPI  C/o sinus congestion, green d/c x 5 d She is flying out of the country in 2 d for 3 wks  Review of Systems  Constitutional: Positive for chills. Negative for fever.  HENT: Positive for congestion, rhinorrhea and sinus pressure. Negative for nosebleeds, sneezing and voice change.   Respiratory: Negative for cough and wheezing.        Objective:   Physical Exam  Constitutional: She appears well-developed and well-nourished. No distress.       NAD  HENT:  Head: Normocephalic.  Mouth/Throat: No oropharyngeal exudate.       eryth throat and nasal mucosa  Eyes: Conjunctivae are normal. Left eye exhibits no discharge.  Cardiovascular: Normal rate.   No murmur heard. Pulmonary/Chest: Breath sounds normal. She has no wheezes. She has no rales.  Lymphadenopathy:    She has no cervical adenopathy.          Assessment & Plan:

## 2012-05-14 ENCOUNTER — Ambulatory Visit (INDEPENDENT_AMBULATORY_CARE_PROVIDER_SITE_OTHER): Payer: 59 | Admitting: Internal Medicine

## 2012-05-14 ENCOUNTER — Encounter: Payer: Self-pay | Admitting: Internal Medicine

## 2012-05-14 VITALS — BP 108/78 | HR 80 | Temp 96.7°F | Resp 16 | Ht 61.0 in | Wt 110.0 lb

## 2012-05-14 DIAGNOSIS — H8309 Labyrinthitis, unspecified ear: Secondary | ICD-10-CM

## 2012-05-14 MED ORDER — MECLIZINE HCL 12.5 MG PO TABS: 12.5000 mg | ORAL_TABLET | Freq: Three times a day (TID) | ORAL | Status: AC | PRN

## 2012-05-14 NOTE — Progress Notes (Signed)
  Subjective:    Patient ID: Kelly Wall, female    DOB: [birth year], 51 y.o.   MRN: [id number]  HPI Kelly Wall presents for evaluation of dizziness. She will have episodes of feeling off balance, like being on a ship's deck, associated with nausea. She has had no head injury, she has no focal neuro symtoms.     Review of Systems     Objective:   Physical Exam Filed Vitals:   05/14/12 1424  BP: 108/78  Pulse: 80  Temp: 96.7 F (35.9 C)  Resp: 16   Wt Readings from Last 3 Encounters:  05/14/12 110 lb (49.896 kg)  02/12/12 111 lb (50.349 kg)  06/20/11 111 lb (50.349 kg)   gen'l- WNWD Kelly Wall woman in no distress HEENT- [place]/AT Cor- RRR Pulm - normal respirations Neuro -A&O x 3, CN II-XII normal, mild dizziness standing at station with eyes closed. Normal gait and tandem gait.        Assessment & Plan:

## 2012-05-14 NOTE — Patient Instructions (Addendum)
Labyrinthitis (Inner Ear Inflammation) Your exam shows you have an inner ear disturbance or labyrinthitis. The cause of this condition is not known. But it may be due to a virus infection. The symptoms of labyrinthitis include vertigo or dizziness made worse by motion, nausea and vomiting. The onset of labyrinthitis may be very sudden. It usually lasts for a few days and then clears up over 1-2 weeks. The treatment of an inner ear disturbance includes bed rest and medications to reduce dizziness, nausea, and vomiting. You should stay away from alcohol, tranquilizers, caffeine, nicotine, or any medicine your doctor thinks may make your symptoms worse. Further testing may be needed to evaluate your hearing and balance system. Please see your doctor or go to the emergency room right away if you have:  Increasing vertigo, earache, loss of hearing, or ear drainage.   Headache, blurred vision, trouble walking, fainting, or fever.   Persistent vomiting, dehydration, or extreme weakness.  Document Released: 11/13/2005 Document Revised: 11/02/2011 Document Reviewed: 05/01/2007 Exit[employer] Patient Information 2012 [employer]. 

## 2012-05-15 DIAGNOSIS — H8309 Labyrinthitis, unspecified ear: Secondary | ICD-10-CM | POA: Insufficient documentation

## 2012-05-15 NOTE — Assessment & Plan Note (Signed)
Patient's symptoms and exam are c/w mild labyrinthitis with no focal neurologic symptoms.   Plan   education  Meclizine 12.5 mg q 6 prn  For worsening symptoms or lack of response to meclizine - neuro imaging.

## 2013-01-03 ENCOUNTER — Encounter: Payer: Self-pay | Admitting: Obstetrics and Gynecology

## 2013-01-08 ENCOUNTER — Ambulatory Visit: Payer: 59 | Admitting: Obstetrics and Gynecology

## 2013-01-08 ENCOUNTER — Encounter: Payer: Self-pay | Admitting: Obstetrics and Gynecology

## 2013-01-08 VITALS — BP 90/62 | HR 70 | Resp 16 | Ht 61.0 in | Wt 108.0 lb

## 2013-01-08 DIAGNOSIS — Z124 Encounter for screening for malignant neoplasm of cervix: Secondary | ICD-10-CM

## 2013-01-08 DIAGNOSIS — Z01419 Encounter for gynecological examination (general) (routine) without abnormal findings: Secondary | ICD-10-CM

## 2013-01-08 NOTE — Progress Notes (Signed)
Subjective:    Kelly Wall is a 52 y.o. female G1P1001 who presents for annual exam. The patient complaints of lack of intimacy with her husband.  She is considering a separation. The patient has a past history of postmenopausal bleeding.  This has resolved.  The patient did not followup as recommended in 2012.  The following portions of the patient's history were reviewed and updated as appropriate: allergies, current medications, past family history, past medical history, past social history, past surgical history and problem list.  Review of Systems Pertinent items are noted in HPI. Gastrointestinal:No change in bowel habits, no abdominal pain, no rectal bleeding Genitourinary:negative for dysuria, frequency, hematuria, nocturia and urinary incontinence    Objective:     BP 90/62  Pulse 70  Resp 16  Ht 5\' 1"  (1.549 m)  Wt 108 lb (48.988 kg)  BMI 20.42 kg/m2  Weight:  Wt Readings from Last 1 Encounters:  01/08/13 108 lb (48.988 kg)     BMI: Body mass index is 20.42 kg/(m^2). General Appearance: Alert, appropriate appearance for age. No acute distress HEENT: Grossly normal Neck / Thyroid: Supple, no masses, nodes or enlargement Lungs: clear to auscultation bilaterally Back: No CVA tenderness Breast Exam: No masses or nodes.No dimpling, nipple retraction or discharge. Cardiovascular: Regular rate and rhythm. S1, S2, no murmur Gastrointestinal: Soft, non-tender, no masses or organomegaly  ++++++++++++++++++++++++++++++++++++++++++++++++++++++++  Pelvic Exam: External genitalia: normal general appearance Vaginal: atrophic mucosa Cervix: normal appearance Adnexa: normal bimanual exam Uterus: normal size shape and consistency Rectovaginal: normal rectal, no masses  ++++++++++++++++++++++++++++++++++++++++++++++++++++++++  Lymphatic Exam: Non-palpable nodes in neck, clavicular, axillary, or inguinal regions  Psychiatric: Alert and oriented, appropriate affect.       Assessment:    Normal gyn exam Menopause    Stress with her husband due to lack of intimacy  Overweight or obese: No  Pelvic relaxation: Yes  Menopausal symptoms: No. Severe: No.   Plan:    Mammogram. Pap smear.   Follow-up:  for annual exam  The updated Pap smear screening guidelines were discussed with the patient. The patient requested that I obtain a Pap smear: Yes.  Kegel exercises discussed: Yes.  Counseling recommended  Proper diet and regular exercise were reviewed.  Annual mammograms recommended starting at age 14. Proper breast care was discussed.  Screening colonoscopy is recommended beginning at age 76.  Regular health maintenance was reviewed.  Sleep hygiene was discussed.  Leonard Schwartz M.D.   Regular Periods: no/ Post-Menopausal Mammogram: yes  Monthly Breast Ex.: yes Exercise: yes  Tetanus < 10 years: no Seatbelts: yes  NI. Bladder Functn.: yes Abuse at home: no  Daily BM's: yes Stressful Work: yes  Healthy Diet: yes Sigmoid-Colonoscopy: None  Calcium: yes Medical problems this year: None   LAST PAP:06/01/2008  Contraception: Post-Menopausal  Mammogram:  2013 WNL  PCP: Jacinta Shoe  PMH: None  FMH: none  Last Bone Scan: None

## 2013-01-09 LAB — PAP IG W/ RFLX HPV ASCU

## 2013-10-08 ENCOUNTER — Other Ambulatory Visit: Payer: Self-pay | Admitting: Obstetrics and Gynecology

## 2013-10-08 DIAGNOSIS — Z1231 Encounter for screening mammogram for malignant neoplasm of breast: Secondary | ICD-10-CM

## 2013-10-09 ENCOUNTER — Emergency Department (HOSPITAL_COMMUNITY)
Admission: EM | Admit: 2013-10-09 | Discharge: 2013-10-09 | Disposition: A | Discharge status: Home / Self Care | Payer: 59 | Attending: Emergency Medicine | Admitting: Emergency Medicine

## 2013-10-09 ENCOUNTER — Emergency Department (HOSPITAL_COMMUNITY): Payer: 59

## 2013-10-09 ENCOUNTER — Encounter (HOSPITAL_COMMUNITY): Payer: Self-pay | Admitting: Emergency Medicine

## 2013-10-09 DIAGNOSIS — Z881 Allergy status to other antibiotic agents status: Secondary | ICD-10-CM | POA: Insufficient documentation

## 2013-10-09 DIAGNOSIS — Z79899 Other long term (current) drug therapy: Secondary | ICD-10-CM | POA: Insufficient documentation

## 2013-10-09 DIAGNOSIS — I10 Essential (primary) hypertension: Secondary | ICD-10-CM | POA: Insufficient documentation

## 2013-10-09 DIAGNOSIS — R079 Chest pain, unspecified: Secondary | ICD-10-CM | POA: Insufficient documentation

## 2013-10-09 DIAGNOSIS — Z888 Allergy status to other drugs, medicaments and biological substances status: Secondary | ICD-10-CM | POA: Insufficient documentation

## 2013-10-09 LAB — D-DIMER, QUANTITATIVE: D-Dimer, Quant: <0.27 ug/mL-FEU (ref 0.00–0.48)

## 2013-10-09 LAB — CBC
HCT: 42.9 % (ref 36.0–46.0)
Hemoglobin: 15.4 g/dL — ABNORMAL HIGH (ref 12.0–15.0)
MCH: 32.8 pg (ref 26.0–34.0)
MCHC: 35.9 g/dL (ref 30.0–36.0)
MCV: 91.5 fL (ref 78.0–100.0)
Platelets: 224 10*3/uL (ref 150–400)
RBC: 4.69 MIL/uL (ref 3.87–5.11)
RDW: 12.2 % (ref 11.5–15.5)
WBC: 4.1 10*3/uL (ref 4.0–10.5)

## 2013-10-09 LAB — COMPREHENSIVE METABOLIC PANEL
ALT: 25 U/L (ref 0–35)
AST: 30 U/L (ref 0–37)
Albumin: 4 g/dL (ref 3.5–5.2)
Alkaline Phosphatase: 54 U/L (ref 39–117)
BUN: 11 mg/dL (ref 6–23)
CO2: 28 mEq/L (ref 19–32)
Calcium: 9.7 mg/dL (ref 8.4–10.5)
Chloride: 99 mEq/L (ref 96–112)
Creatinine, Ser: 0.72 mg/dL (ref 0.50–1.10)
GFR calc Af Amer: >90 mL/min (ref >90)
GFR calc non Af Amer: >90 mL/min (ref >90)
Glucose, Bld: 105 mg/dL — ABNORMAL HIGH (ref 70–99)
Potassium: 3.6 mEq/L (ref 3.5–5.1)
Sodium: 136 mEq/L (ref 135–145)
Total Bilirubin: 0.4 mg/dL (ref 0.3–1.2)
Total Protein: 7.8 g/dL (ref 6.0–8.3)

## 2013-10-09 LAB — POCT I-STAT TROPONIN I
Troponin i, poc: 0 ng/mL (ref 0.00–0.08)
Troponin i, poc: 0 ng/mL (ref 0.00–0.08)

## 2013-10-09 MED ORDER — ASPIRIN 81 MG PO CHEW
324.0000 mg | CHEWABLE_TABLET | Freq: Once | ORAL | Status: AC
  Administered 2013-10-09: 324 mg via ORAL
  Filled 2013-10-09: qty 4

## 2013-10-09 NOTE — ED Notes (Signed)
Pt with L chest pain she describes as piercing that started this am.  No cardiac hx.  Denies nausea, sob and is not diaphoretic.

## 2013-10-09 NOTE — ED Provider Notes (Signed)
CSN: [id number]     Arrival date & time 10/09/13  0705 History   First MD Initiated Contact with Patient 10/09/13 [phone]     Chief Complaint  Patient presents with  . Chest Pain   (Consider location/radiation/quality/duration/timing/severity/associated sxs/prior Treatment) HPI Comments: 52 year old female presents with 1-1/2 hours of acute chest pain. She says started washes meditating. Felt like a "piercing pain" in her left chest. It is improved down to a 5. She states it comes and goes every few minutes. No shortness of breath, nausea, diaphoresis. No fevers. Has not been sick recently. Has never had pain like this before. She denies smoking. She has a history of hypertension is controlled with fish oil. Denies any cancer history, leg swelling, hemoptysis, or recent surgery. No pleuritic symptoms    Past Medical History  Diagnosis Date  . Hypertension    History reviewed. No pertinent past surgical history. Family History  Problem Relation Age of Onset  . Hypertension Other    History  Substance Use Topics  . Smoking status: Never Smoker   . Smokeless tobacco: Not on file  . Alcohol Use: No   OB History   Grav Para Term Preterm Abortions TAB SAB Ect Mult Living   1 1 1       1      Review of Systems  Constitutional: Negative for fever and chills.  Respiratory: Negative for cough and shortness of breath.   Cardiovascular: Positive for chest pain. Negative for leg swelling.  Gastrointestinal: Negative for nausea, vomiting and abdominal pain.  Musculoskeletal: Negative for back pain.  All other systems reviewed and are negative.    Allergies  Tramadol; Butorphanol tartrate; Hydrochlorothiazide w-triamterene; Propranolol hcl; and Tetracycline hcl  Home Medications   Current Outpatient Rx  Name  Route  Sig  Dispense  Refill  . Cholecalciferol (EQL VITAMIN D3) 1000 UNITS tablet   Oral   Take 1,000 Units by mouth daily.           . Omega-3 Fatty Acids (FISH OIL) 1000  MG CAPS   Oral   Take 4 capsules by mouth daily.          BP 149/76  Pulse 66  Temp(Src) 98.1 F (36.7 C) (Oral)  Resp 22  Ht 5\' 1"  (1.549 m)  Wt 108 lb 9.6 oz (49.261 kg)  BMI 20.53 kg/m2  SpO2 100% Physical Exam  Nursing note and vitals reviewed. Constitutional: She is oriented to person, place, and time. She appears well-developed and well-nourished. No distress.  Reading on ipad  HENT:  Head: Normocephalic and atraumatic.  Right Ear: External ear normal.  Left Ear: External ear normal.  Nose: Nose normal.  Eyes: Right eye exhibits no discharge. Left eye exhibits no discharge.  Cardiovascular: Normal rate, regular rhythm and normal heart sounds.   Pulmonary/Chest: Effort normal and breath sounds normal. She exhibits tenderness (minimal to left chest).  Abdominal: Soft. She exhibits no distension. There is no tenderness.  Musculoskeletal: She exhibits no edema and no tenderness.  Neurological: She is alert and oriented to person, place, and time.  Skin: Skin is warm and dry.    ED Course  Procedures (including critical care time) Labs Review Labs Reviewed  CBC - Abnormal; Notable for the following:    Hemoglobin 15.4 (*)    All other components within normal limits  COMPREHENSIVE METABOLIC PANEL - Abnormal; Notable for the following:    Glucose, Bld 105 (*)    All other components within normal limits  D-DIMER, QUANTITATIVE  POCT I-STAT TROPONIN I  POCT I-STAT TROPONIN I   Imaging Review Dg Chest 2 View  10/09/2013   CLINICAL DATA:  Intermittent left chest pain  EXAM: CHEST  2 VIEW  COMPARISON:  None.  FINDINGS: The lungs are clear and negative for focal airspace consolidation, pulmonary edema or suspicious pulmonary nodule. Suggestion of a nodular opacity projecting over the periphery of the bilateral upper lobes can be seen superficial to the skin on the lateral radiograph and likely reflects radiolucent cardiac leads. No pleural effusion or pneumothorax.  Cardiac and mediastinal contours are within normal limits. No acute fracture or lytic or blastic osseous lesions. The visualized upper abdominal bowel gas pattern is unremarkable.  IMPRESSION: No active cardiopulmonary disease.   Electronically Signed   By: Malachy Moan M.D.   On: 10/09/2013 07:53    EKG Interpretation     Ventricular Rate:  63 PR Interval:  174 QRS Duration: 88 QT Interval:  407 QTC Calculation: 417 R Axis:   85 Text Interpretation:  Sinus or ectopic atrial rhythm ST elev, probable normal early repol pattern No old tracing to compare            MDM   1. Chest pain    Patient with atypical chest pain that waxes and wanes. EKG shows some early repol, no reciprocal changes. D/w Dr. Charletta Cousin of cards who agrees EKG is more c/w early repol. Has 2 negative troponins and normal ddimer. Doubt dissection with mild pain and intermittent sx. Has atyipcal story and is low risk by HEART score (2 - low risk), and thus feel she can be managed as an outpatient. 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 She will call after discharge to set up close f/u.    Audree Camel, MD 10/09/13 [phone]

## 2013-10-09 NOTE — Discharge Instructions (Signed)
Your caregiver has diagnosed you as having chest pain that is not specific for one problem, but does not require admission.  You are at low risk for an acute heart condition or other serious illness. Chest pain comes from many different causes.  SEEK IMMEDIATE MEDICAL ATTENTION IF: You have severe chest pain, especially if the pain is crushing or pressure-like and spreads to the arms, back, neck, or jaw, or if you have sweating, nausea (feeling sick to your stomach), or shortness of breath. THIS IS AN EMERGENCY. Don't wait to see if the pain will go away. Get medical help at once. Call 911 or 0 (operator). DO NOT drive yourself to the hospital.  Your chest pain gets worse and does not go away with rest.  You have an attack of chest pain lasting longer than usual, despite rest and treatment with the medications your caregiver has prescribed.  You wake from sleep with chest pain or shortness of breath.  You feel dizzy or faint.  You have chest pain not typical of your usual pain for which you originally saw your caregiver.    Chest Pain Observation It is often hard to give a specific diagnosis for the cause of chest pain. Among other possibilities your symptoms might be caused by inadequate oxygen delivery to your heart (angina). Angina that is not treated or evaluated can lead to a heart attack (myocardial infarction) or death. Blood tests, electrocardiograms, and X-rays may have been done to help determine a possible cause of your chest pain. After evaluation and observation, your health care provider has determined that it is unlikely your pain was caused by an unstable condition that requires hospitalization. However, a full evaluation of your pain may need to be completed, with additional diagnostic testing as directed. It is very important to keep your follow-up appointments. Not keeping your follow-up appointments could result in permanent heart damage, disability, or death. If there is any problem  keeping your follow-up appointments, you must call your health care provider. HOME CARE INSTRUCTIONS  Due to the slight chance that your pain could be angina, it is important to follow your health care provider's treatment plan and also maintain a healthy lifestyle:  Maintain or work toward achieving a healthy weight.  Stay physically active and exercise regularly.  Decrease your salt intake.  Eat a balanced, healthy diet. Talk to a dietician to learn about heart healthy foods.  Increase your fiber intake by including whole grains, vegetables, fruits, and nuts in your diet.  Avoid situations that cause stress, anger, or depression.  Take medicines as advised by your health care provider. Report any side effects to your health care provider. Do not stop medicines or adjust the dosages on your own.  Quit smoking. Do not use nicotine patches or gum until you check with your health care provider.  Keep your blood pressure, blood sugar, and cholesterol levels within normal limits.  Limit alcohol intake to no more than 1 drink per day for women that are not pregnant and 2 drinks per day for men.  Do not abuse drugs. SEEK IMMEDIATE MEDICAL CARE IF: You have severe chest pain or pressure which may include symptoms such as:  You feel pain or pressure in you arms, neck, jaw, or back.  You have severe back or abdominal pain, feel sick to your stomach (nauseous), or throw up (vomit).  You are sweating profusely.  You are having a fast or irregular heartbeat.  You feel short of breath while  at rest.  You notice increasing shortness of breath during rest, sleep, or with activity.  You have chest pain that does not get better after rest or after taking your usual medicine.  You wake from sleep with chest pain.  You are unable to sleep because you cannot breathe.  You develop a frequent cough or you are coughing up blood.  You feel dizzy, faint, or experience extreme fatigue.  You  develop severe weakness, dizziness, fainting, or chills. Any of these symptoms may represent a serious problem that is an emergency. Do not wait to see if the symptoms will go away. Call your local emergency services (911 in the [place] Do not drive yourself to the hospital. MAKE SURE YOU:  Understand these instructions.  Will watch your condition.  Will get help right away if you are not doing well or get worse. Document Released: 12/16/2010 Document Revised: 07/16/2013 Document Reviewed: 05/15/2013 [facility] Patient Information 2014 [place], [place].

## 2013-10-15 ENCOUNTER — Ambulatory Visit (HOSPITAL_COMMUNITY): Payer: 59

## 2013-10-22 ENCOUNTER — Ambulatory Visit (HOSPITAL_COMMUNITY): Payer: 59

## 2013-11-04 ENCOUNTER — Ambulatory Visit (HOSPITAL_COMMUNITY)
Admission: RE | Admit: 2013-11-04 | Discharge: 2013-11-04 | Disposition: A | Discharge status: Home / Self Care | Payer: 59 | Source: Ambulatory Visit | Attending: Obstetrics and Gynecology | Admitting: Obstetrics and Gynecology

## 2013-11-04 DIAGNOSIS — Z1231 Encounter for screening mammogram for malignant neoplasm of breast: Secondary | ICD-10-CM

## 2014-04-13 ENCOUNTER — Ambulatory Visit (INDEPENDENT_AMBULATORY_CARE_PROVIDER_SITE_OTHER): Payer: 59 | Admitting: Family Medicine

## 2014-04-13 ENCOUNTER — Encounter: Payer: Self-pay | Admitting: Family Medicine

## 2014-04-13 VITALS — BP 122/85 | Ht 61.0 in | Wt 106.0 lb

## 2014-04-13 DIAGNOSIS — M25519 Pain in unspecified shoulder: Secondary | ICD-10-CM

## 2014-04-13 DIAGNOSIS — G5600 Carpal tunnel syndrome, unspecified upper limb: Secondary | ICD-10-CM

## 2014-04-14 NOTE — Assessment & Plan Note (Signed)
Rhomboid muscle spasm secondary to posture and stressed most likely. Instructed her in overhead press exercise, wall pushups, floor pushups. Her clinic when necessary

## 2014-04-14 NOTE — Progress Notes (Signed)
Patient ID: Kelly Wall Pinela, female   DOB: 1961/09/01, 53 y.o.   MRN: [id number]  Kelly Wall Kelly Wall - 53 y.o. female MRN [id number]  Date of birth: 1961/09/01    SUBJECTIVE:     Right-hand numbness. Has noticed it more over the last 2 or 3 weeks, particularly when she's on the phone for a long period of time. She also notes it in the morning when she first wakes up and occasionally during the day if her hand or wrist is in a same position for quite a while. Numbness is primarily the first 2 fingers and the thumb. She has trouble holding onto things when she's having numbness but no true loss of strength. #2. Upper back pain between the shoulder blades. This is intermittent and seems to be related somewhat to stress level. 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 She notes when she's had a long stressful day its worst. She works out regularly and does not have any chest or shoulder pain with working out. ROS:     No unusual weight change, no fever, sweats, chills. She's noted no color change in her hands. No sensation of cold or heat in her hands.  PERTINENT  PMH / PSH FH / / SH:  Past Medical, Surgical, Social, and Family History Reviewed & Updated per EMR.  Pertinent Historical Findings include:  No personal history diabetes mellitus.  OBJECTIVE: BP 122/85  Ht 5\' 1"  (1.549 m)  Wt 106 lb (48.081 kg)  BMI 20.04 kg/m2  Physical Exam:  Vital signs are reviewed. GENERAL: Well-developed female no acute distress WRISTS: Bilaterally wrists are symmetrical. She has a positive Tinel on the right negative on the left. The Phalen's test is quite positive at 10 seconds on the right and this reproduces her symptoms perfectly. There is no thenar eminence atrophy on either side. She is intact grip strength abduction and adduction flexion extension of fingers and thumb. BACK: Tender to palpation of the rhomboid area particularly on the right. There is mild amount of muscle spasm noted here. Scapular  function is normal and symmetrical.  ULTRASOUND: Median nerve on the right wrist is somewhat flattened with a small amount of fluid noted in some of the wrist flexor tendons around that. ASSESSMENT & PLAN:  See problem based charting & AVS for pt instructions.

## 2014-04-14 NOTE — Assessment & Plan Note (Signed)
Pretty significant symptomatic carpal tunnel syndrome by Phalen testing. She's been wearing a cockup wrist splint but she's only been wearing it during the day. We discussed that the more appropriate time as we are during the night. She'll try this for 4-6 weeks and return to clinic if not improving. She does not want to consider corticosteroid injection or surgery if possible. She is right-hand dominant

## 2014-09-28 ENCOUNTER — Encounter: Payer: Self-pay | Admitting: Family Medicine

## 2014-10-08 ENCOUNTER — Other Ambulatory Visit (HOSPITAL_COMMUNITY): Payer: Self-pay | Admitting: Obstetrics and Gynecology

## 2014-10-08 DIAGNOSIS — Z1231 Encounter for screening mammogram for malignant neoplasm of breast: Secondary | ICD-10-CM

## 2014-10-30 ENCOUNTER — Ambulatory Visit (INDEPENDENT_AMBULATORY_CARE_PROVIDER_SITE_OTHER): Payer: 59 | Admitting: Internal Medicine

## 2014-10-30 ENCOUNTER — Encounter: Payer: Self-pay | Admitting: Internal Medicine

## 2014-10-30 VITALS — BP 120/80 | HR 72 | Temp 98.1°F | Wt 109.0 lb

## 2014-10-30 DIAGNOSIS — R030 Elevated blood-pressure reading, without diagnosis of hypertension: Secondary | ICD-10-CM

## 2014-10-30 DIAGNOSIS — M25521 Pain in right elbow: Secondary | ICD-10-CM

## 2014-10-30 DIAGNOSIS — M79621 Pain in right upper arm: Secondary | ICD-10-CM

## 2014-10-30 DIAGNOSIS — H11009 Unspecified pterygium of unspecified eye: Secondary | ICD-10-CM

## 2014-11-06 ENCOUNTER — Ambulatory Visit (HOSPITAL_COMMUNITY): Payer: 59

## 2014-11-17 ENCOUNTER — Encounter: Payer: Self-pay | Admitting: Internal Medicine

## 2014-11-17 ENCOUNTER — Ambulatory Visit (HOSPITAL_COMMUNITY)
Admission: RE | Admit: 2014-11-17 | Discharge: 2014-11-17 | Disposition: A | Discharge status: Home / Self Care | Payer: 59 | Source: Ambulatory Visit | Attending: Obstetrics and Gynecology | Admitting: Obstetrics and Gynecology

## 2014-11-17 DIAGNOSIS — Z1231 Encounter for screening mammogram for malignant neoplasm of breast: Secondary | ICD-10-CM

## 2014-11-17 DIAGNOSIS — M25529 Pain in unspecified elbow: Secondary | ICD-10-CM | POA: Insufficient documentation

## 2014-11-17 DIAGNOSIS — H11009 Unspecified pterygium of unspecified eye: Secondary | ICD-10-CM | POA: Insufficient documentation

## 2014-11-17 NOTE — Assessment & Plan Note (Signed)
BP Readings from Last 3 Encounters:  10/30/14 120/80  04/13/14 122/85  10/09/13 100/82

## 2014-11-17 NOTE — Progress Notes (Signed)
   Subjective:   HPI  C/o elbow pain R  Review of Systems  Constitutional: Negative for chills, activity change, appetite change, fatigue and unexpected weight change.  HENT: Negative for congestion, mouth sores and sinus pressure.   Eyes: Negative for visual disturbance.  Respiratory: Negative for cough and chest tightness.   Gastrointestinal: Negative for nausea and abdominal pain.  Genitourinary: Negative for frequency, difficulty urinating and vaginal pain.  Musculoskeletal: Negative for back pain and gait problem.  Skin: Negative for pallor and rash.  Neurological: Negative for dizziness, tremors, weakness, numbness and headaches.  Psychiatric/Behavioral: Negative for confusion and sleep disturbance.       Objective:   Physical Exam  Constitutional: She appears well-developed. No distress.  HENT:  Head: Normocephalic.  Right Ear: External ear normal.  Left Ear: External ear normal.  Nose: Nose normal.  Mouth/Throat: Oropharynx is clear and moist.  Eyes: Conjunctivae are normal. Pupils are equal, round, and reactive to light. Right eye exhibits no discharge. Left eye exhibits no discharge.  Neck: Normal range of motion. Neck supple. No JVD present. No tracheal deviation present. No thyromegaly present.  Cardiovascular: Normal rate, regular rhythm and normal heart sounds.   Pulmonary/Chest: No stridor. No respiratory distress. She has no wheezes.  Abdominal: Soft. Bowel sounds are normal. She exhibits no distension and no mass. There is no tenderness. There is no rebound and no guarding.  Musculoskeletal: She exhibits tenderness. She exhibits no edema.  Lymphadenopathy:    She has no cervical adenopathy.  Neurological: She displays normal reflexes. No cranial nerve deficit. She exhibits normal muscle tone. Coordination normal.  Skin: No rash noted. No erythema.  Psychiatric: She has a normal mood and affect. Her behavior is normal. Judgment and thought content normal.  R  elbow is tender R eye pterygium        Assessment & Plan:

## 2014-11-17 NOTE — Assessment & Plan Note (Signed)
Ophth ref 

## 2014-12-11 ENCOUNTER — Encounter: Payer: Self-pay | Admitting: Family Medicine

## 2014-12-11 ENCOUNTER — Encounter (INDEPENDENT_AMBULATORY_CARE_PROVIDER_SITE_OTHER): Payer: Self-pay

## 2014-12-11 ENCOUNTER — Ambulatory Visit (INDEPENDENT_AMBULATORY_CARE_PROVIDER_SITE_OTHER): Payer: 59 | Admitting: Family Medicine

## 2014-12-11 VITALS — BP 127/84 | Ht 61.0 in | Wt 109.0 lb

## 2014-12-11 DIAGNOSIS — M629 Disorder of muscle, unspecified: Secondary | ICD-10-CM

## 2014-12-11 DIAGNOSIS — M25511 Pain in right shoulder: Secondary | ICD-10-CM

## 2014-12-11 DIAGNOSIS — M6289 Other specified disorders of muscle: Secondary | ICD-10-CM | POA: Insufficient documentation

## 2014-12-11 MED ORDER — MELOXICAM 15 MG PO TABS: 15.0000 mg | ORAL_TABLET | Freq: Every day | ORAL | Status: DC
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
## 2014-12-11 NOTE — Patient Instructions (Signed)
Two types of pushups. Regular pushups 20 a day and then elevated pushups 20 a day.  Increase gradually to twice a day For the trapezius what she did to overhead press with his heavy weighted she can do 8-12. Do this at least once a day. Add shoulder shrugs using as  heavy weight as you  can and do 20 of these daily with scapular retraction.  Hamstring stretches 10 on each side twice a day

## 2014-12-11 NOTE — Assessment & Plan Note (Signed)
Chronic rhomboid and trapezius muscle spasm. See patient handout for exercises regimen. See her back in one month. I'll also place her on meloxicam for one month. Cautioned her not use over-the-counter Advil Naprosyn or Aleve.

## 2015-04-21 ENCOUNTER — Encounter: Payer: Self-pay | Admitting: Internal Medicine

## 2015-11-01 ENCOUNTER — Encounter: Payer: Self-pay | Admitting: Family Medicine

## 2015-11-01 ENCOUNTER — Ambulatory Visit (INDEPENDENT_AMBULATORY_CARE_PROVIDER_SITE_OTHER): Payer: 59 | Admitting: Family Medicine

## 2015-11-01 VITALS — BP 120/66 | Ht 60.0 in | Wt 109.0 lb

## 2015-11-01 DIAGNOSIS — M25511 Pain in right shoulder: Secondary | ICD-10-CM

## 2015-11-01 MED ORDER — CYCLOBENZAPRINE HCL 5 MG PO TABS: 5.0000 mg | ORAL_TABLET | Freq: Three times a day (TID) | ORAL | Status: DC | PRN

## 2015-11-01 MED ORDER — METHYLPREDNISOLONE ACETATE 40 MG/ML IJ SUSP
40.0000 mg | Freq: Once | INTRAMUSCULAR | Status: AC
  Administered 2015-11-01: 40 mg via INTRA_ARTICULAR

## 2015-11-01 NOTE — Patient Instructions (Signed)
I want you BACk in the GYM. Focus on bench press, lateral raise, latissimus pull downs and cable pulls. Also add push ups-

## 2015-11-03 NOTE — Progress Notes (Signed)
   Subjective:    Patient ID: Kelly Wall, female    DOB: [birth year], [age] y.o.   MRN: [id number]  HPI Recurrence of right upper back and posterior shoulder pain. Worse over last 3 weeks. Has been having some increased stressors (parents--father with cancer). Thinks this may be contributing. Has had spasm and pain in this area before. No radiation down arm. No weakness, No arm tingling or hand numbness.   Review of Systems Pertinent review of systems: negative for fever or unusual weight change.     Objective:   Physical Exam  Vital signs reviewed. GENERAL: Well developed, well nourished, no acute distress BACK: Right rhomboid area has a significant amount of spasm. TTp here and reproduces pain.  SHOULDER: right:  FROM. strength intact in all planes of rotater cuff. NEURO: intact sensation to soft touch B hands, symmetrical VASC: radial pulses 2+ B=  INJECTION: Patient was given informed consent, signed copy in the chart. Appropriate time out was taken. Area prepped and draped in usual sterile fashion. 1 cc of methylprednisolone 10 mg/ml plus  1 cc of 1% lidocaine without epinephrine was injected into three arts of the rhomboid muscle on the right posterior shoulder area using a(n) perpendicular approach. The patient tolerated the procedure well. There were no complications. Post procedure instructions were given.       Assessment & Plan:

## 2015-11-29 ENCOUNTER — Other Ambulatory Visit: Payer: Self-pay | Admitting: *Deleted

## 2015-11-29 MED ORDER — MELOXICAM 15 MG PO TABS: 15.0000 mg | ORAL_TABLET | Freq: Every day | ORAL | Status: DC

## 2015-12-03 DIAGNOSIS — Z0100 Encounter for examination of eyes and vision without abnormal findings: Secondary | ICD-10-CM | POA: Diagnosis not present

## 2015-12-03 DIAGNOSIS — H11002 Unspecified pterygium of left eye: Secondary | ICD-10-CM | POA: Diagnosis not present

## 2015-12-28 MED FILL — CLINDAMYCIN-BENZOYL PEROX G: 1-5 | 30 days supply | Qty: 50 | Fill #1

## 2015-12-28 MED FILL — TRETINOIN 0.025% CREAM: 0.025 | 90 days supply | Qty: 45 | Fill #0

## 2016-02-04 MED FILL — PREMPRO 0.625-2.5 MG TABLET: 0.625-2.5 | 84 days supply | Qty: 84 | Fill #0

## 2016-03-22 ENCOUNTER — Encounter: Payer: Self-pay | Admitting: Sports Medicine

## 2016-03-22 ENCOUNTER — Ambulatory Visit (INDEPENDENT_AMBULATORY_CARE_PROVIDER_SITE_OTHER): Payer: 59 | Admitting: Sports Medicine

## 2016-03-22 ENCOUNTER — Ambulatory Visit: Payer: 59 | Attending: Internal Medicine | Admitting: Physical Therapy

## 2016-03-22 VITALS — BP 145/76 | HR 69 | Ht 61.0 in | Wt 110.0 lb

## 2016-03-22 DIAGNOSIS — M542 Cervicalgia: Secondary | ICD-10-CM

## 2016-03-22 DIAGNOSIS — R202 Paresthesia of skin: Secondary | ICD-10-CM

## 2016-03-22 DIAGNOSIS — M5412 Radiculopathy, cervical region: Secondary | ICD-10-CM | POA: Diagnosis not present

## 2016-03-22 DIAGNOSIS — M79621 Pain in right upper arm: Secondary | ICD-10-CM | POA: Insufficient documentation

## 2016-03-22 DIAGNOSIS — R293 Abnormal posture: Secondary | ICD-10-CM | POA: Insufficient documentation

## 2016-03-22 MED ORDER — MELOXICAM 15 MG PO TABS: ORAL_TABLET | ORAL | Status: DC

## 2016-03-22 MED FILL — MELOXICAM 15 MG TABLET: 15 | 40 days supply | Qty: 40 | Fill #0

## 2016-03-22 NOTE — Assessment & Plan Note (Addendum)
She is likely having radicular pain starting from her Cspine radiating to her upper back, shoulder, and down to her hands with some numbness/tingling of right index finger, and objective weakness of the bicep and tricep muscles on the right arm. Also has diminished tricep reflex on right. This is likely caused by C6 nerve room impingement.   - will order Xray of Cspine along with MRI of Cspine. - asked her to take Meloxicam 15mg  daily for at least one week as she is not comfortable with prednisone (causes mood problems) - ordered amb PT - asked to avoid activities that increases pain.  Patient seen and evaluated with the resident. I agree with the above plan of care. 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 She has noticeable weakness with resisted triceps extension on the right. Therefore, we will start with getting some imaging initially in the form of x-rays and then progressing to an MRI specifically to rule out a significant cervical disc herniation. She will start physical therapy and meloxicam. I think she needs to avoid activities that increase pain including aggressive bike riding. She will follow-up with me next week for reevaluation. We will delineate further treatment based on her imaging as well as her response to today's treatment. - f/up closely in 1 week with Dr. Margaretha Sheffieldraper.
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
## 2016-03-22 NOTE — Patient Instructions (Addendum)
Patient given info on sitting posture, upper crossed syndrome and ergonomics, cervical stab.     Axial Extension (Chin Tuck)    Pull chin in and lengthen back of neck. Hold ___5_ seconds while counting out loud. Repeat __10__ times. Do __2__ sessions per day. Asked her to do in supine with towel roll  http://gt2.exer.us/450   Copyright  VHI. All rights reserved.    Scapular Retraction (Standing)    With arms at sides, pinch shoulder blades together. Repeat __10__ times per set. Do _1___ sets per session. Do __5__ sessions per day.  http://orth.exer.us/945   Copyright  VHI. All rights reserved.

## 2016-03-23 MED FILL — CLINDAMYCIN-BENZOYL PEROX 1: 1-5 | 30 days supply | Qty: 25 | Fill #2

## 2016-03-23 MED FILL — TRETINOIN 0.025% CREAM: 0.025 | 90 days supply | Qty: 45 | Fill #1

## 2016-03-23 NOTE — Therapy (Signed)
[facility] Outpatient Rehabilitation [facility] [address] [place], [place], [address] Phone: [phone]   Fax:  [phone]  Physical Therapy Evaluation  Patient Details  Name: Kelly Wall MRN: [id number] Date of Birth: 08/05/1961 Referring Provider: Dr. Reino Bellis  Encounter Date: 03/22/2016      PT End of Session - 03/22/16 1840    Visit Number 1   Number of Visits 16   Date for PT Re-Evaluation 05/17/16   PT Start Time 1430   PT Stop Time 1530   PT Time Calculation (min) 60 min   Activity Tolerance Patient tolerated treatment well   Behavior During Therapy [facility] for tasks assessed/performed      Past Medical History  Diagnosis Date  . Hypertension     No past surgical history on file.  There were no vitals filed for this visit.       Subjective Assessment - 03/22/16 1436    Subjective Patient was in [place] 3 weeks ago and reported onset of radiating pain in Rt. UE.  She has had chronic pain in Rt. upper back,neck for months which she had been treated for.  She complains of difficulty turning head and looking up.  Pain radiates down into Rt. arm to fingers (all, numb and tingly).  She complains of Rt. hand weakness, diff doing makeup and holding a cup. Has muscle spasms.  She saw Dr. Margaretha Sheffield today and she will have an MRI Friday. for suspected HNP.    Pertinent History chronic shoulder pain   Limitations Sitting;Lifting;Other (comment);Writing  work duties, sleep   How long can you sit comfortably? 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    Patient Stated Goals Pt wants to be able to ride her bike again, she is an avid biker and frequently goes on long trips (2-3 days). She also wants to cont lifting weights which she has stopped.    Currently in Pain? Yes   Pain Score 6   no pain meds   Pain Location Scapula  and Rt. arm   Pain Orientation Right   Pain Descriptors / Indicators Throbbing;Heaviness   Pain Type Acute pain;Chronic pain   Pain Radiating Towards Rt. arm, lateral 3 fingers and underside of arm    Pain Onset More than a month ago   Pain Frequency Constant   Aggravating Factors  sitting too long    Pain Relieving Factors temp relief with [place], heat, massage , elevating her arm    Effect of Pain on Daily Activities difficulty concentrating, cant bike    Multiple Pain Sites No            [facility] PT Assessment - 03/22/16 1443    Assessment   Medical Diagnosis neck pain    Referring Provider Dr. Reino Bellis   Onset Date/Surgical Date --  acute on chronic , 3 weeks more acutely.    Prior Therapy Not formally   Precautions   Precautions None   Restrictions   Weight Bearing Restrictions No   Balance Screen   Has the patient fallen in the past 6 months No   Home Environment   Living Environment Private residence   Prior Function   Level of Independence Independent   Cognition   Overall Cognitive Status Within Functional Limits for tasks assessed   Observation/Other Assessments   Focus on Therapeutic Outcomes (FOTO)  65%   Sensation  Light Touch Impaired by gross assessment   Coordination   Gross Motor Movements are Fluid and Coordinated Not tested   Posture/Postural Control   Postural Limitations Rounded Shoulders;Forward head   Posture Comments can correct with cues, Rt. shoulder elevated and more anterior, IR   AROM   Cervical Flexion 30   Cervical Extension 15  pain in arm    Cervical - Right Side Bend 40   Cervical - Left Side Bend 12  pain    Cervical - Right Rotation 45   Cervical - Left Rotation 70   Strength   Right Shoulder Flexion 4+/5   Right Shoulder ABduction 4+/5   Left Shoulder Flexion 4+/5   Left Shoulder ABduction 4+/5   Right Elbow Flexion 4/5   Right Elbow Extension 4/5   Left Elbow Flexion 5/5   Left Elbow Extension 5/5   Palpation   Palpation comment min pain at superior angle  of scapular, tender Rt. upper trap and levator scap   Special Tests   Thoracic Outlet Syndrome  Adson Test;Costcoclavicular Syndrome Test   Spurling's   Findings Positive   Side Right   Distraction Test   Findngs Positive   other    Findings Positive   Side Right   Comment Bakody's sign, abduction test  position of comfort is Rt. arm overhead   Adson Test   Findings Negative   Costcoclavicular Syndrome Test   Findings Negative                   [facility] Adult PT Treatment/Exercise - 03/22/16 1505    Neck Exercises: Supine   Capital Flexion 5 reps   Capital Flexion Limitations HEP OA nod   Other Supine Exercise cervical stab ex x 10 : alt arms and horiz abd    Manual Therapy   Passive ROM rotation and lateral flexion    Manual Traction suboccital release and manual traction  less UE symptoms     7 min ice pain to Rt. Neck/shoulder, pt did not like. Gave her MHP to return to her desk.  Educated on benefit of heat vs ice when pain is more severe.             PT Education - 03/22/16 1839    Education provided Yes   Education Details posture, PT/POC, HEP, ADLs (supine to sit)   Person(s) Educated Patient   Methods Explanation;Demonstration;Tactile cues;Verbal cues;Handout   Comprehension Returned demonstration;Verbalized understanding;Need further instruction          PT Short Term Goals - 03/23/16 0736    PT SHORT TERM GOAL #1   Title Pt will be I with basic HEP for cervical stab, gentle stretching   Time 4   Period Weeks   Status New   PT SHORT TERM GOAL #2   Title Pt will report 25% less symptoms in Rt. UE (pain, sensory)   Time 4   Status New   PT SHORT TERM GOAL #3   Title Pt will be comfortable in supine with pillows and tolerate for up to 15 min for treatment   Time 4   Period Weeks   Status New           PT Long Term Goals - 03/23/16 0737    PT LONG TERM GOAL #1   Title Patient will be I with HEP   Time 8   Period Weeks   Status  New   PT LONG TERM GOAL #2   Title  Pt will be able to sit for 1 hour with no increase in pain in Rt. UE/neck   Time 8   Period Weeks   Status New   PT LONG TERM GOAL #3   Title Pt will be able to rotate and extend neck  to Rt. with min discomfort in neck for driving, conversation   Time 8   Period Weeks   Status New   PT LONG TERM GOAL #4   Title Pt will score <40% limited on FOTO to demo less functional limitation   Time 8   Period Weeks   Status New   PT LONG TERM GOAL #5   Title Triceps and biceps strength will be 4+/5 or more on Rt. side    Time 8   Period Weeks   Status New               Plan - 03/23/16 0730    Clinical Impression Statement Kelly Wall presents for mod complexity evaluation of acute on chronic neck and Rt. UE pain.  Clinical symptoms are consistent with lower cervical disc herniation/protrusion.  Decreased ROM by 25 deg in rotation, sensory changes, weakness and special tests confirm.  She will benefit from conservative therapy including behavior modifcation, corrective exercises, and pain modalities.  She is eager to return to recreational activities but she was urged to use caution and refrain until follow up with MD post MRI.    Rehab Potential Excellent   PT Frequency 2x / week   PT Duration 8 weeks   PT Treatment/Interventions ADLs/Self Care Home Management;Traction;Neuromuscular re-education;Passive range of motion;Patient/family education;Cryotherapy;Electrical Stimulation;Moist Heat;Therapeutic exercise;Manual techniques;Therapeutic activities;Functional mobility training;Dry needling;Taping;Other (comment)  Pilates   PT Next Visit Plan check HEP, mechanical vs manual traction, heat vs ice   PT Home Exercise Plan chin tuck, scap retract and frequent posture checks   Consulted and Agree with Plan of Care Patient      Patient will benefit from skilled therapeutic intervention in order to improve the following deficits and impairments:  Decreased range  of motion, Increased fascial restricitons, Pain, Impaired UE functional use, Increased muscle spasms, Hypomobility, Improper body mechanics, Impaired flexibility, Decreased mobility, Decreased strength, Impaired sensation, Postural dysfunction  Visit Diagnosis: Cervicalgia  Pain in right upper arm  Abnormal posture  Radiculopathy, cervical region  Paresthesia of skin     Problem List Patient Active Problem List   Diagnosis Date Noted  . Cervical radicular pain 03/22/2016  . Hamstring tightness 12/11/2014  . 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 Pain in joint, upper arm 11/17/2014  . Labyrinthitis 05/15/2012  . Sinusitis acute 02/12/2012  . Well adult exam 06/27/2011  . Rash, skin 06/27/2011  . ADHESIVE CAPSULITIS, LEFT 11/09/2009  . SHOULDER PAIN 09/09/2009  . HAIR LOSS 07/24/2008  . ELEVATED BP 07/24/2008  . CARPAL TUNNEL SYNDROME 03/23/2008  . PARESTHESIA 03/23/2008  . MIGRAINE HEADACHE 08/02/2007    Cledith Abdou 03/23/2016, 7:40 AM  [facility] [address] [place], [place], [address] Phone: [phone]   Fax:  [phone]  Name: Kelly Wall MRN: [id number] Date of Birth: [birth year]   Karie MainlandJennifer Emiah Pellicano, PT 03/23/2016 7:41 AM Phone: [phone] Fax: [phone]

## 2016-03-24 ENCOUNTER — Ambulatory Visit: Payer: 59 | Admitting: Family Medicine

## 2016-03-24 ENCOUNTER — Ambulatory Visit (HOSPITAL_COMMUNITY)
Admission: RE | Admit: 2016-03-24 | Discharge: 2016-03-24 | Disposition: A | Discharge status: Home / Self Care | Payer: 59 | Source: Ambulatory Visit | Attending: Sports Medicine | Admitting: Sports Medicine

## 2016-03-24 ENCOUNTER — Ambulatory Visit: Payer: 59

## 2016-03-24 DIAGNOSIS — M4802 Spinal stenosis, cervical region: Secondary | ICD-10-CM | POA: Insufficient documentation

## 2016-03-24 DIAGNOSIS — M50223 Other cervical disc displacement at C6-C7 level: Secondary | ICD-10-CM | POA: Insufficient documentation

## 2016-03-24 DIAGNOSIS — M542 Cervicalgia: Secondary | ICD-10-CM

## 2016-03-24 DIAGNOSIS — R202 Paresthesia of skin: Secondary | ICD-10-CM | POA: Diagnosis not present

## 2016-03-24 DIAGNOSIS — M5412 Radiculopathy, cervical region: Secondary | ICD-10-CM | POA: Diagnosis not present

## 2016-03-24 DIAGNOSIS — R293 Abnormal posture: Secondary | ICD-10-CM

## 2016-03-24 DIAGNOSIS — M50221 Other cervical disc displacement at C4-C5 level: Secondary | ICD-10-CM | POA: Diagnosis not present

## 2016-03-24 DIAGNOSIS — M79621 Pain in right upper arm: Secondary | ICD-10-CM | POA: Diagnosis not present

## 2016-03-24 DIAGNOSIS — M5022 Other cervical disc displacement, mid-cervical region, unspecified level: Secondary | ICD-10-CM | POA: Insufficient documentation

## 2016-03-24 NOTE — Therapy (Signed)
[facility] Outpatient Rehabilitation [facility] [address] [place], [place], [address] Phone: [phone]   Fax:  [phone]  Physical Therapy Treatment  Patient Details  Name: SALIMATA CHRISTENSON MRN: [id number] Date of Birth: 11-03-1961 Referring Provider: Dr. Reino Bellis  Encounter Date: 03/24/2016      PT End of Session - 03/24/16 1039    Visit Number 2   Number of Visits 16   Date for PT Re-Evaluation 05/17/16   PT Start Time 0800   PT Stop Time 0850   PT Time Calculation (min) 50 min   Activity Tolerance Patient tolerated treatment well   Behavior During Therapy [facility] for tasks assessed/performed      Past Medical History  Diagnosis Date  . Hypertension     No past surgical history on file.  There were no vitals filed for this visit.      Subjective Assessment - 03/24/16 1042    Subjective She reports she is some better as she is 4/10 today but feels she has a high pain tolerance.  She reported [place], STW, traction helped her most in past   Currently in Pain? Yes   Pain Score 4    Pain Location Scapula  neck   Pain Orientation Right   Pain Descriptors / Indicators Throbbing;Heaviness   Pain Type Acute pain;Chronic pain   Pain Onset More than a month ago   Pain Frequency Constant   Multiple Pain Sites No                         [facility] Adult PT Treatment/Exercise - 03/24/16 0833    Ultrasound   Ultrasound Location RT neck   Ultrasound Parameters 100% 1 MHz, 1.6 Wcm2   Ultrasound Goals Pain   Traction   Type of Traction Cervical   Min (lbs) 5   Max (lbs) 14   Hold Time 60   Rest Time 15   Time 12   Manual Therapy   Manual Therapy Soft tissue mobilization   Soft tissue mobilization STW with tool RT neck medial scapula muscles into traps.      HMP she took to her desk placed to neck             PT Short Term Goals - 03/23/16 0736    PT SHORT TERM GOAL #1   Title Pt will be I with basic HEP for cervical  stab, gentle stretching   Time 4   Period Weeks   Status New   PT SHORT TERM GOAL #2   Title Pt will report 25% less symptoms in Rt. UE (pain, sensory)   Time 4   Status New   PT SHORT TERM GOAL #3   Title Pt will be comfortable in supine with pillows and tolerate for up to 15 min for treatment   Time 4   Period Weeks   Status New           PT Long Term Goals - 03/23/16 0737    PT LONG TERM GOAL #1   Title Patient will be I with HEP   Time 8   Period Weeks   Status New   PT LONG TERM GOAL #2   Title Pt will be able to sit for 1 hour with no increase in pain in Rt. UE/neck   Time 8   Period Weeks   Status New   PT LONG TERM GOAL #3   Title Pt will be able  to rotate and extend neck  to Rt. with min discomfort in neck for driving, conversation   Time 8   Period Weeks   Status New   PT LONG TERM GOAL #4   Title Pt will score <40% limited on FOTO to demo less functional limitation   Time 8   Period Weeks   Status New   PT LONG TERM GOAL #5   Title Triceps and biceps strength will be 4+/5 or more on Rt. side    Time 8   Period Weeks   Status New               Plan - 03/24/16 [id number]    Clinical Impression Statement Opted to do treatment with items she felt has helped her in past. She was not worse after session. Continue modalities and manual . After results of MRI discuss treatment options   PT Next Visit Plan Continue modalities and manual, If MRI results back discuss   Consulted and Agree with Plan of Care Patient      Patient will benefit from skilled therapeutic intervention in order to improve the following deficits and impairments:  Decreased range of motion, Increased fascial restricitons, Pain, Impaired UE functional use, Increased muscle spasms, Hypomobility, Improper body mechanics, Impaired flexibility, Decreased mobility, Decreased strength, Impaired sensation, Postural dysfunction  Visit Diagnosis: Radiculopathy, cervical  region  Cervicalgia  Abnormal posture     Problem List Patient Active Problem List   Diagnosis Date Noted  . Cervical radicular pain 03/22/2016  . Hamstring tightness 12/11/2014  . 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 Pain in joint, upper arm 11/17/2014  . Labyrinthitis 05/15/2012  . Sinusitis acute 02/12/2012  . Well adult exam 06/27/2011  . Rash, skin 06/27/2011  . ADHESIVE CAPSULITIS, LEFT 11/09/2009  . SHOULDER PAIN 09/09/2009  . HAIR LOSS 07/24/2008  . ELEVATED BP 07/24/2008  . CARPAL TUNNEL SYNDROME 03/23/2008  . PARESTHESIA 03/23/2008  . MIGRAINE HEADACHE 08/02/2007    Caprice RedChasse, Webster Patrone M PT 03/24/2016, 10:44 AM  [facility] [address] [place], [place], [address] Phone: [phone]   Fax:  [phone]  Name: Welford RocheRosario P Aydelott MRN: [id number] Date of Birth: 09-20-1961

## 2016-03-27 ENCOUNTER — Ambulatory Visit: Payer: 59 | Attending: Internal Medicine | Admitting: Physical Therapy

## 2016-03-27 DIAGNOSIS — R293 Abnormal posture: Secondary | ICD-10-CM

## 2016-03-27 DIAGNOSIS — M542 Cervicalgia: Secondary | ICD-10-CM | POA: Diagnosis not present

## 2016-03-27 DIAGNOSIS — M5412 Radiculopathy, cervical region: Secondary | ICD-10-CM | POA: Diagnosis present

## 2016-03-27 DIAGNOSIS — M79621 Pain in right upper arm: Secondary | ICD-10-CM

## 2016-03-27 DIAGNOSIS — R202 Paresthesia of skin: Secondary | ICD-10-CM | POA: Diagnosis not present

## 2016-03-27 NOTE — Therapy (Signed)
[place], [place], [address] Phone: [phone]   Fax:  [phone]  Physical Therapy Treatment  Patient Details  Name: Kelly Wall MRN: [id number] Date of Birth: May 23, 1961 Referring Provider: Dr. Lilia Argue  Encounter Date: 03/27/2016      PT End of Session - 03/27/16 1556    Visit Number 3   Number of Visits 16   PT Start Time 1504   PT Stop Time 1610   PT Time Calculation (min) 66 min   Activity Tolerance Patient tolerated treatment well   Behavior During Therapy [facility] for tasks assessed/performed      Past Medical History  Diagnosis Date  . Hypertension     No past surgical history on file.  There were no vitals filed for this visit.      Subjective Assessment - 03/27/16 1506    Subjective neck exercises hurt.     Currently in Pain? Yes   Pain Score 6    Pain Location --  armpit, arm   Pain Orientation Right   Pain Descriptors / Indicators Throbbing;Heaviness;Tingling   Pain Radiating Towards RT arm to fingers, lateral 4 fingers   Aggravating Factors  Using arm,, abduction,  laying on right side.    Pain Relieving Factors meloxicam halps a little.  [place], massage, elevating arm, over the counter and prescription medication                         [facility] Adult PT Treatment/Exercise - 03/27/16 0001    Self-Care   Self-Care --  posture ed/ discussion   Neck Exercises: Supine   Neck Retraction 5 reps   Neck Retraction Limitations painful 7/10   Capital Flexion 5 reps  less pain than neck retraction. %X   Other Supine Exercise dural stretch,  no change in symptoms   Shoulder Exercises: Seated   Retraction 5 reps   Ultrasound   Ultrasound Location RT neck upper   Ultrasound Parameters 100 % , 8 minutes   Ultrasound Goals Pain   Traction   Min (lbs) 5   Max (lbs) 16   Time 17  protocol  cervical with dics involvement   Manual Therapy   Manual Therapy  Soft tissue mobilization   Soft tissue mobilization instrument assist RT nrck, upper quadrant.  Peri scapular mediar reproduced symptoms into axilla.  less pain post.                  PT Short Term Goals - 03/27/16 1601    PT SHORT TERM GOAL #1   Title Pt will be I with basic HEP for cervical stab, gentle stretching   Time 4   Period Weeks   Status On-going   PT SHORT TERM GOAL #2   Title Pt will report 25% less symptoms in Rt. UE (pain, sensory)   Time 4   Period Weeks   Status On-going   PT SHORT TERM GOAL #3   Title Pt will be comfortable in supine with pillows and tolerate for up to 15 min for treatment   Time 4   Period Weeks   Status Unable to assess           PT Long Term Goals - 03/23/16 0737    PT LONG TERM GOAL #1   Title Patient will be I with HEP   Time 8   Period Weeks   Status New   PT LONG  TERM GOAL #2   Title Pt will be able to sit for 1 hour with no increase in pain in Rt. UE/neck   Time 8   Period Weeks   Status New   PT LONG TERM GOAL #3   Title Pt will be able to rotate and extend neck  to Rt. with min discomfort in neck for driving, conversation   Time 8   Period Weeks   Status New   PT LONG TERM GOAL #4   Title Pt will score <40% limited on FOTO to demo less functional limitation   Time 8   Period Weeks   Status New   PT LONG TERM GOAL #5   Title Triceps and biceps strength will be 4+/5 or more on Rt. side    Time 8   Period Weeks   Status New               Plan - 03/27/16 1558    Clinical Impression Statement Pain continues to be constant.  manual and [place] helped symptoms some.  trial of traction using protocol for cervical disc involvement with muscle guarding.   No new goals met.    PT Next Visit Plan assess treatment.  See what MRI results reveal.  stabilization.    PT Home Exercise Plan continue previous   Consulted and Agree with Plan of Care Patient      Patient will benefit from skilled therapeutic  intervention in order to improve the following deficits and impairments:  Decreased range of motion, Increased fascial restricitons, Pain, Impaired UE functional use, Increased muscle spasms, Hypomobility, Improper body mechanics, Impaired flexibility, Decreased mobility, Decreased strength, Impaired sensation, Postural dysfunction  Visit Diagnosis: Cervicalgia  Abnormal posture  Radiculopathy, cervical region  Pain in right upper arm  Paresthesia of skin     Problem List Patient Active Problem List   Diagnosis Date Noted  . Cervical radicular pain 03/22/2016  . Hamstring tightness 12/11/2014  . 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 Pain in joint, upper arm 11/17/2014  . Labyrinthitis 05/15/2012  . Sinusitis acute 02/12/2012  . Well adult exam 06/27/2011  . Rash, skin 06/27/2011  . ADHESIVE CAPSULITIS, LEFT 11/09/2009  . SHOULDER PAIN 09/09/2009  . HAIR LOSS 07/24/2008  . ELEVATED BP 07/24/2008  . CARPAL TUNNEL SYNDROME 03/23/2008  . PARESTHESIA 03/23/2008  . MIGRAINE HEADACHE 08/02/2007    Azion Centrella 03/27/2016, 4:03 PM  [facility] [address] [place], [place], [address] Phone: [phone]   Fax:  [phone]  Name: Kelly Wall MRN: [id number] Date of Birth: 19-Oct-1961    Melvenia Needles, PTA 03/27/2016 4:03 PM Phone: [phone] Fax: [phone]

## 2016-03-28 DIAGNOSIS — Z01411 Encounter for gynecological examination (general) (routine) with abnormal findings: Secondary | ICD-10-CM | POA: Diagnosis not present

## 2016-03-28 DIAGNOSIS — Z124 Encounter for screening for malignant neoplasm of cervix: Secondary | ICD-10-CM | POA: Diagnosis not present

## 2016-03-28 DIAGNOSIS — R87610 Atypical squamous cells of undetermined significance on cytologic smear of cervix (ASC-US): Secondary | ICD-10-CM | POA: Diagnosis not present

## 2016-03-28 DIAGNOSIS — Z6821 Body mass index (BMI) 21.0-21.9, adult: Secondary | ICD-10-CM | POA: Diagnosis not present

## 2016-03-28 DIAGNOSIS — Z1231 Encounter for screening mammogram for malignant neoplasm of breast: Secondary | ICD-10-CM | POA: Diagnosis not present

## 2016-03-28 MED FILL — NYSTATIN/TRIAMCINOLONE CRM: 100000-0.1 | 30 days supply | Qty: 60 | Fill #0

## 2016-03-28 MED FILL — DROSPIR-ETH ESTRA 3/.02 MG: 3-0.02 | 28 days supply | Qty: 28 | Fill #0

## 2016-03-29 ENCOUNTER — Encounter: Payer: Self-pay | Admitting: Physical Therapy

## 2016-03-29 ENCOUNTER — Ambulatory Visit: Payer: 59 | Admitting: Physical Therapy

## 2016-03-29 ENCOUNTER — Ambulatory Visit (INDEPENDENT_AMBULATORY_CARE_PROVIDER_SITE_OTHER): Payer: 59 | Admitting: Sports Medicine

## 2016-03-29 ENCOUNTER — Encounter: Payer: Self-pay | Admitting: Sports Medicine

## 2016-03-29 VITALS — BP 120/62

## 2016-03-29 DIAGNOSIS — R202 Paresthesia of skin: Secondary | ICD-10-CM | POA: Diagnosis not present

## 2016-03-29 DIAGNOSIS — M5412 Radiculopathy, cervical region: Secondary | ICD-10-CM

## 2016-03-29 DIAGNOSIS — M79621 Pain in right upper arm: Secondary | ICD-10-CM

## 2016-03-29 DIAGNOSIS — R293 Abnormal posture: Secondary | ICD-10-CM

## 2016-03-29 DIAGNOSIS — M542 Cervicalgia: Secondary | ICD-10-CM

## 2016-03-29 DIAGNOSIS — M5010 Cervical disc disorder with radiculopathy, unspecified cervical region: Secondary | ICD-10-CM

## 2016-03-29 NOTE — Progress Notes (Signed)
Patient ID: [name], female   DOB: [birth year], 55 y.o.   MRN: [id number]  Patient comes in today to discuss MRI findings of her cervical spine. 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 This explains the pain and weakness that she is getting in her right arm. The remainder of her cervical spine shows only mild degenerative changes. Based on these findings as well as her weakness, I've recommended consultation with neurosurgery. She would like to ask her friends and colleagues for their opinion in regards to which neurosurgeon she should see. She will call me for a formal referral when she has made that decision. In the meantime, she can continue with physical therapy if she finds it helpful. I've also cautioned her about avoiding those activities and exercises that she knows will make her symptoms worse (such as biking).  Total time spent with the patient was 10 minutes with greater than 50% of the time spent in face-to-face consultation discussing her MRI findings and treatment plan.

## 2016-03-29 NOTE — Therapy (Signed)
[facility] Outpatient Rehabilitation [facility] [address] [place], [place], [address] Phone: [phone]   Fax:  [phone]  Physical Therapy Treatment  Patient Details  Name: Kelly Wall MRN: [id number] Date of Birth: 23-Oct-1961 Referring Provider: Dr. Reino Bellis  Encounter Date: 03/29/2016      PT End of Session - 03/29/16 1427    Visit Number 4   Number of Visits 16   Date for PT Re-Evaluation 05/17/16   PT Start Time 1340   PT Stop Time 1453   PT Time Calculation (min) 73 min   Behavior During Therapy [facility] for tasks assessed/performed      Past Medical History  Diagnosis Date  . Hypertension     History reviewed. No pertinent past surgical history.  There were no vitals filed for this visit.      Subjective Assessment - 03/29/16 1425    Subjective pt reports RUE was throbbing all night last night, did not do anything except go to work and go home yesterday.    Diagnostic tests See MRI reports   Currently in Pain? Yes   Pain Score 7                          [facility] Adult PT Treatment/Exercise - 03/29/16 0001    Ultrasound   Ultrasound Location R periscapular, midline   Ultrasound Parameters 100% 8 min   Ultrasound Goals Pain   Traction   Min (lbs) 5   Max (lbs) 16   Hold Time 60   Rest Time 15   Time 17   Manual Therapy   Manual therapy comments rib external rotation mobilizations, anterior rib mob with breaths   Soft tissue mobilization rhomboids, R upper trapezius, R subscap                PT Education - 03/29/16 1426    Education provided Yes   Education Details referred pain from trigger points   Person(s) Educated Patient   Methods Explanation          PT Short Term Goals - 03/27/16 1601    PT SHORT TERM GOAL #1   Title Pt will be I with basic HEP for cervical stab, gentle stretching   Time 4   Period Weeks   Status On-going   PT SHORT TERM GOAL #2   Title Pt will report 25% less  symptoms in Rt. UE (pain, sensory)   Time 4   Period Weeks   Status On-going   PT SHORT TERM GOAL #3   Title Pt will be comfortable in supine with pillows and tolerate for up to 15 min for treatment   Time 4   Period Weeks   Status Unable to assess           PT Long Term Goals - 03/23/16 0737    PT LONG TERM GOAL #1   Title Patient will be I with HEP   Time 8   Period Weeks   Status New   PT LONG TERM GOAL #2   Title Pt will be able to sit for 1 hour with no increase in pain in Rt. UE/neck   Time 8   Period Weeks   Status New   PT LONG TERM GOAL #3   Title Pt will be able to rotate and extend neck  to Rt. with min discomfort in neck for driving, conversation   Time 8   Period Weeks  Status New   PT LONG TERM GOAL #4   Title Pt will score <40% limited on FOTO to demo less functional limitation   Time 8   Period Weeks   Status New   PT LONG TERM GOAL #5   Title Triceps and biceps strength will be 4+/5 or more on Rt. side    Time 8   Period Weeks   Status New               Plan - 03/29/16 1442    Clinical Impression Statement trigger point pressure created concordant pain so trigger points were released. Pt reported no pain following but pain felt heavy.    PT Next Visit Plan assess outcome of treatment. assess work Financial controllerergonomics      Patient will benefit from skilled therapeutic intervention in order to improve the following deficits and impairments:  Decreased range of motion, Increased fascial restricitons, Pain, Impaired UE functional use, Increased muscle spasms, Hypomobility, Improper body mechanics, Impaired flexibility, Decreased mobility, Decreased strength, Impaired sensation, Postural dysfunction  Visit Diagnosis: Cervicalgia  Abnormal posture  Radiculopathy, cervical region  Pain in right upper arm  Paresthesia of skin     Problem List Patient Active Problem List   Diagnosis Date Noted  . Cervical radicular pain 03/22/2016  .  Hamstring tightness 12/11/2014  . 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 Pain in joint, upper arm 11/17/2014  . Labyrinthitis 05/15/2012  . Sinusitis acute 02/12/2012  . Well adult exam 06/27/2011  . Rash, skin 06/27/2011  . ADHESIVE CAPSULITIS, LEFT 11/09/2009  . SHOULDER PAIN 09/09/2009  . HAIR LOSS 07/24/2008  . ELEVATED BP 07/24/2008  . CARPAL TUNNEL SYNDROME 03/23/2008  . PARESTHESIA 03/23/2008  . MIGRAINE HEADACHE 08/02/2007   Nikia Levels C. Havilah Topor PT, DPT 03/29/2016 2:47 PM   [facility] [address] [place], [place], [address] Phone: [phone]   Fax:  [phone]  Name: Kelly Wall MRN: [id number] Date of Birth: Apr 25, 1961

## 2016-03-31 ENCOUNTER — Ambulatory Visit: Payer: 59 | Admitting: Physical Therapy

## 2016-03-31 DIAGNOSIS — M5412 Radiculopathy, cervical region: Secondary | ICD-10-CM

## 2016-03-31 DIAGNOSIS — M79621 Pain in right upper arm: Secondary | ICD-10-CM

## 2016-03-31 DIAGNOSIS — R293 Abnormal posture: Secondary | ICD-10-CM

## 2016-03-31 DIAGNOSIS — R202 Paresthesia of skin: Secondary | ICD-10-CM | POA: Diagnosis not present

## 2016-03-31 DIAGNOSIS — M542 Cervicalgia: Secondary | ICD-10-CM | POA: Diagnosis not present

## 2016-03-31 NOTE — Therapy (Signed)
[facility] Outpatient Rehabilitation [facility] [address] [place], [place], [address] Phone: [phone]   Fax:  [phone]  Physical Therapy Treatment  Patient Details  Name: Kelly Wall MRN: [id number] Date of Birth: 05/02/1961 Referring Provider: Dr. Reino Bellis  Encounter Date: 03/31/2016      PT End of Session - 03/31/16 1137    Visit Number 5   Number of Visits 16   Date for PT Re-Evaluation 05/17/16   PT Start Time 1106   PT Stop Time 1155   PT Time Calculation (min) 49 min   Activity Tolerance Patient limited by pain   Behavior During Therapy [facility] for tasks assessed/performed      Past Medical History  Diagnosis Date  . Hypertension     No past surgical history on file.  There were no vitals filed for this visit.      Subjective Assessment - 03/31/16 1106    Subjective Pain is less overall, sees Neurosurgeon 5/19.  Tingling in Rt. forearm. See pain assessment below.    Currently in Pain? Yes   Pain Score 5    Pain Location Neck  and scap and Rt. UE    Pain Orientation Right         [facility] Adult PT Treatment/Exercise - 03/31/16 1112    Neck Exercises: Standing   Neck Retraction 5 reps   Other Standing Exercises Postural correction with hands on wall, pt has difficulty maintaining neutral spine in L spine and in hed neck and shoulders at the same time, pain increased, tingling increased    Neck Exercises: Supine   Neck Retraction 5 reps;5 secs   Shoulder Flexion Both;10 reps   Shoulder Flexion Weights (lbs) alternating on foam roller for stabilization    Shoulder ABduction Both;15 reps   Shoulder Abduction Weights (lbs) horiz abd on foam    Other Supine Exercise protraction/retraction x 10    Traction   Min (lbs) 5   Max (lbs) 16   Hold Time 60   Rest Time 15   Time 17   Manual Therapy   Manual Traction with slight lateral flexion to L, reduced sx on Rt.                 PT Education - 03/31/16 1137    Education provided Yes   Education Details posture   Person(s) Educated Patient   Methods Explanation;Demonstration   Comprehension Verbalized understanding;Returned demonstration          PT Short Term Goals - 03/27/16 1601    PT SHORT TERM GOAL #1   Title Pt will be I with basic HEP for cervical stab, gentle stretching   Time 4   Period Weeks   Status On-going   PT SHORT TERM GOAL #2   Title Pt will report 25% less symptoms in Rt. UE (pain, sensory)   Time 4   Period Weeks   Status On-going   PT SHORT TERM GOAL #3   Title Pt will be comfortable in supine with pillows and tolerate for up to 15 min for treatment   Time 4   Period Weeks   Status Unable to assess           PT Long Term Goals - 03/31/16 1141    PT LONG TERM GOAL #1   Title Patient will be I with HEP   Status On-going   PT LONG TERM GOAL #2   Title Pt will be able to sit for 1  hour with no increase in pain in Rt. UE/neck   Status On-going   PT LONG TERM GOAL #3   Title Pt will be able to rotate and extend neck  to Rt. with min discomfort in neck for driving, conversation   Status On-going   PT LONG TERM GOAL #4   Title Pt will score <40% limited on FOTO to demo less functional limitation   Status On-going   PT LONG TERM GOAL #5   Title Triceps and biceps strength will be 4+/5 or more on Rt. side    Status On-going               Plan - 03/31/16 1138    Clinical Impression Statement Focused session on posture and stablization.  She was reminded to NOT attempt previous activities that increase pain.  She tried a plank yesterday and pain was increased to severe.  Does report less pain overall.     PT Next Visit Plan cont to manage pain and educate, is she doing a formal HEP?    PT Home Exercise Plan continue previous   Consulted and Agree with Plan of Care Patient      Patient will benefit from skilled therapeutic intervention in order to improve the following deficits and impairments:   Decreased range of motion, Increased fascial restricitons, Pain, Impaired UE functional use, Increased muscle spasms, Hypomobility, Improper body mechanics, Impaired flexibility, Decreased mobility, Decreased strength, Impaired sensation, Postural dysfunction  Visit Diagnosis: Cervicalgia  Abnormal posture  Radiculopathy, cervical region  Pain in right upper arm  Paresthesia of skin     Problem List Patient Active Problem List   Diagnosis Date Noted  . Cervical radicular pain 03/22/2016  . Hamstring tightness 12/11/2014  . 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 Pain in joint, upper arm 11/17/2014  . Labyrinthitis 05/15/2012  . Sinusitis acute 02/12/2012  . Well adult exam 06/27/2011  . Rash, skin 06/27/2011  . ADHESIVE CAPSULITIS, LEFT 11/09/2009  . SHOULDER PAIN 09/09/2009  . HAIR LOSS 07/24/2008  . ELEVATED BP 07/24/2008  . CARPAL TUNNEL SYNDROME 03/23/2008  . PARESTHESIA 03/23/2008  . MIGRAINE HEADACHE 08/02/2007    Paul Trettin 03/31/2016, 12:13 PM  [facility] [address] [place], [place], [address] Phone: [phone]   Fax:  [phone]  Name: Kelly Wall MRN: [id number] Date of Birth: 03/18/1961    Karie MainlandJennifer Myriah Boggus, PT 03/31/2016 12:13 PM Phone: [phone] Fax: [phone]

## 2016-04-03 ENCOUNTER — Ambulatory Visit: Payer: 59 | Admitting: Physical Therapy

## 2016-04-03 DIAGNOSIS — M79621 Pain in right upper arm: Secondary | ICD-10-CM | POA: Diagnosis not present

## 2016-04-03 DIAGNOSIS — R293 Abnormal posture: Secondary | ICD-10-CM

## 2016-04-03 DIAGNOSIS — M542 Cervicalgia: Secondary | ICD-10-CM

## 2016-04-03 DIAGNOSIS — R202 Paresthesia of skin: Secondary | ICD-10-CM

## 2016-04-03 DIAGNOSIS — M5412 Radiculopathy, cervical region: Secondary | ICD-10-CM

## 2016-04-03 NOTE — Patient Instructions (Signed)
Levator Stretch    Grasp seat or sit on hand on side to be stretched. Turn head toward other side and look down. Use hand on head to gently stretch neck in that position. Hold _5-10___ seconds. Repeat on other side. Repeat __3__ times. Do ___as needed_ sessions per day.  http://gt2.exer.us/31   Copyright  VHI. All rights reserved.  Flexibility: Upper Trapezius Stretch    Gently grasp right side of head while reaching behind back with other hand. Tilt head away until a gentle stretch is felt. Hold _10___ seconds. Repeat _3___ times per set. Do _1___ sets per session. Do  as needed ____ sessions per day.  http://orth.exer.us/341   Copyright  VHI. All rights reserved.  Ear / Shoulder Stretch    Exhaling, move left ear toward left shoulder. Hold position for ___ 5-10, bring head back to center. Repeat to other side. Repeat sequence _3__ times. Do as needed___ times per day.  Copyright  VHI. All rights reserved.  Adduction (Active)    Maintaining erect posture, draw shoulders back while bringing elbows back and inward. Repeat __3-5__ times. Do _as needed___ sessions per day.  Copyright  VHI. All rights reserved.  These stretches

## 2016-04-03 NOTE — Therapy (Signed)
Kelly Wall, [place], [address] Phone: [phone]   Fax:  [phone]  Physical Therapy Treatment  Patient Details  Name: Kelly Wall MRN: [id number] Date of Birth: 02/21/1961 Referring Provider: Dr. Lilia Argue  Encounter Date: 04/03/2016      PT End of Session - 04/03/16 1554    Visit Number 6   Number of Visits 16   Date for PT Re-Evaluation 05/17/16   PT Start Time 6962   PT Stop Time 1608   PT Time Calculation (min) 62 min   Activity Tolerance Patient tolerated treatment well   Behavior During Therapy [facility] for tasks assessed/performed      Past Medical History  Diagnosis Date  . Hypertension     No past surgical history on file.  There were no vitals filed for this visit.      Subjective Assessment - 04/03/16 1524    Subjective Pain worse with exercise.  Constant.  7/10   Currently in Pain? Yes   Pain Score 7    Pain Location Neck   Pain Orientation Right   Pain Descriptors / Indicators Aching;Burning;Tightness;Sharp;Heaviness   Pain Radiating Towards Rt arm , ribs scapular area   Aggravating Factors  exercise   Pain Relieving Factors medication.                          [place] Adult PT Treatment/Exercise - 04/03/16 0001    Ultrasound   Ultrasound Location R perisacpular, neck, upper traps   Ultrasound Parameters 100%  1.5 watts/cm2   Ultrasound Goals Pain   Traction   Type of Traction Cervical   Min (lbs) 5   Max (lbs) 16   Time 17  protocol used for hol/ rest times :Disc invilvement with mus   Manual Therapy   Manual Therapy Soft tissue mobilization   Soft tissue mobilization RT shoulder girdle Neck, instrument assist.  Tissue softened, pain unchanged.   Neck Exercises: Stretches   Upper Trapezius Stretch 1 rep;10 seconds   Levator Stretch 1 rep;10 seconds   Other Neck Stretches scapular retraction 3 reps 10 seconds                PT  Education - 04/03/16 1609    Education provided Yes   Education Details stretches   Person(s) Educated Patient   Methods Explanation;Demonstration;Verbal cues;Handout   Comprehension Verbalized understanding;Returned demonstration          PT Short Term Goals - 04/03/16 1616    PT SHORT TERM GOAL #1   Title Pt will be I with basic HEP for cervical stab, gentle stretching   Baseline independent with gentle stretching.     Time 4   Period Weeks   Status Partially Met   PT SHORT TERM GOAL #2   Title Pt will report 25% less symptoms in Rt. UE (pain, sensory)   Baseline not better yet   Time 4   Period Weeks   Status On-going   PT SHORT TERM GOAL #3   Title Pt will be comfortable in supine with pillows and tolerate for up to 15 min for treatment   Baseline intermittant comfortable supine in bed   Time 4   Period Weeks   Status On-going           PT Long Term Goals - 03/31/16 1141    PT LONG TERM GOAL #1   Title Patient will be I with  HEP   Status On-going   PT LONG TERM GOAL #2   Title Pt will be able to sit for 1 hour with no increase in pain in Rt. UE/neck   Status On-going   PT LONG TERM GOAL #3   Title Pt will be able to rotate and extend neck  to Rt. with min discomfort in neck for driving, conversation   Status On-going   PT LONG TERM GOAL #4   Title Pt will score <40% limited on FOTO to demo less functional limitation   Status On-going   PT LONG TERM GOAL #5   Title Triceps and biceps strength will be 4+/5 or more on Rt. side    Status On-going               Plan - 04/03/16 1554    Clinical Impression Statement Pain flare since exercising. 7/10 pain.  She declined exercises, wanting to focus on pain control.  She did not have change in pain with [place] and manual. Tho she said [place] is soothing.  Prior to traction.  After traction pain decreased to 5/10.  She fell asleep on the machine. She feels she has support in her work chair.  She uses her Left arm to do  linnen. Progress toward home exercise goal.     PT Next Visit Plan cont to manage pain and educate, review stretches.  Consider IFC?    PT Home Exercise Plan stretching   Consulted and Agree with Plan of Care Patient      Patient will benefit from skilled therapeutic intervention in order to improve the following deficits and impairments:  Decreased range of motion, Increased fascial restricitons, Pain, Impaired UE functional use, Increased muscle spasms, Hypomobility, Improper body mechanics, Impaired flexibility, Decreased mobility, Decreased strength, Impaired sensation, Postural dysfunction  Visit Diagnosis: Cervicalgia  Abnormal posture  Radiculopathy, cervical region  Pain in right upper arm  Paresthesia of skin     Problem List Patient Active Problem List   Diagnosis Date Noted  . Cervical radicular pain 03/22/2016  . Hamstring tightness 12/11/2014  . 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 Pain in joint, upper arm 11/17/2014  . Labyrinthitis 05/15/2012  . Sinusitis acute 02/12/2012  . Well adult exam 06/27/2011  . Rash, skin 06/27/2011  . ADHESIVE CAPSULITIS, LEFT 11/09/2009  . SHOULDER PAIN 09/09/2009  . HAIR LOSS 07/24/2008  . ELEVATED BP 07/24/2008  . CARPAL TUNNEL SYNDROME 03/23/2008  . PARESTHESIA 03/23/2008  . MIGRAINE HEADACHE 08/02/2007    Kelly Wall 04/03/2016, 4:20 PM  [facility] [address] [place], [place], [address] Phone: [phone]   Fax:  [phone]  Name: Kelly Wall MRN: [id number] Date of Birth: Feb 03, 1961    Melvenia Needles, PTA 04/03/2016 4:20 PM Phone: [phone] Fax: [phone]

## 2016-04-05 ENCOUNTER — Ambulatory Visit: Payer: 59 | Admitting: Physical Therapy

## 2016-04-05 DIAGNOSIS — M79621 Pain in right upper arm: Secondary | ICD-10-CM

## 2016-04-05 DIAGNOSIS — R202 Paresthesia of skin: Secondary | ICD-10-CM | POA: Diagnosis not present

## 2016-04-05 DIAGNOSIS — M542 Cervicalgia: Secondary | ICD-10-CM | POA: Diagnosis not present

## 2016-04-05 DIAGNOSIS — M5412 Radiculopathy, cervical region: Secondary | ICD-10-CM | POA: Diagnosis not present

## 2016-04-05 DIAGNOSIS — R293 Abnormal posture: Secondary | ICD-10-CM | POA: Diagnosis not present

## 2016-04-05 NOTE — Therapy (Signed)
[place], [place], [address] Phone: [phone]   Fax:  [phone]  Physical Therapy Treatment  Patient Details  Name: Kelly Wall MRN: [id number] Date of Birth: 10-30-1961 Referring Provider: Dr. Lilia Argue  Encounter Date: 04/05/2016      PT End of Session - 04/05/16 1711    Visit Number 7   Number of Visits 16   Date for PT Re-Evaluation 05/17/16   PT Start Time 1505   PT Stop Time 1605   PT Time Calculation (min) 60 min   Activity Tolerance Patient limited by pain   Behavior During Therapy Restless      Past Medical History  Diagnosis Date  . Hypertension     No past surgical history on file.  There were no vitals filed for this visit.      Subjective Assessment - 04/05/16 1559    Subjective 8/10 constant throbbing.  Massage with tool not as helpful as you using hands.     Currently in Pain? Yes   Pain Score 8    Pain Location Neck   Pain Orientation Right   Pain Radiating Towards rt fingers other than thumb, ribs   Pain Frequency Constant   Aggravating Factors  just moving   Pain Relieving Factors medication,  [place] Massage, traction, heat   Multiple Pain Sites No                         [facility] Adult PT Treatment/Exercise - 04/05/16 0001    Moist Heat Therapy   Number Minutes Moist Heat 15 Minutes  suspect pain increase from slight neck extension in supine   Moist Heat Location --  cervical upper back.  Pain increased in arm so removed heat    Electrical Stimulation   Electrical Stimulation Location Upper quadrant    Electrical Stimulation Action IFC   Electrical Stimulation Parameters 7   Electrical Stimulation Goals Pain   Ultrasound   Ultrasound Location RT upper quadrant    Ultrasound Parameters 100% 1.5 watts/cm@  8 minutes   Ultrasound Goals Pain   Manual Therapy   Manual Therapy Soft tissue mobilization   Manual therapy comments  neuromuscular trigger point release multiple.  just distal to the rhomboids, on paraspinals.  Very painfull,  tho decreased pain to 5/10                  PT Short Term Goals - 04/03/16 1616    PT SHORT TERM GOAL #1   Title Pt will be I with basic HEP for cervical stab, gentle stretching   Baseline independent with gentle stretching.     Time 4   Period Weeks   Status Partially Met   PT SHORT TERM GOAL #2   Title Pt will report 25% less symptoms in Rt. UE (pain, sensory)   Baseline not better yet   Time 4   Period Weeks   Status On-going   PT SHORT TERM GOAL #3   Title Pt will be comfortable in supine with pillows and tolerate for up to 15 min for treatment   Baseline intermittant comfortable supine in bed   Time 4   Period Weeks   Status On-going           PT Long Term Goals - 03/31/16 1141    PT LONG TERM GOAL #1   Title Patient will be I with HEP   Status On-going  PT LONG TERM GOAL #2   Title Pt will be able to sit for 1 hour with no increase in pain in Rt. UE/neck   Status On-going   PT LONG TERM GOAL #3   Title Pt will be able to rotate and extend neck  to Rt. with min discomfort in neck for driving, conversation   Status On-going   PT LONG TERM GOAL #4   Title Pt will score <40% limited on FOTO to demo less functional limitation   Status On-going   PT LONG TERM GOAL #5   Title Triceps and biceps strength will be 4+/5 or more on Rt. side    Status On-going               Plan - 04/05/16 1712    Clinical Impression Statement Pain with trigger point release decreased to 5/10.  Pain then increased to 9/10 while supine on incline on heat/IFC,  After discussion with PT, I think neck may have been in a little extension which irritated her nerve.  Pain finally decreased to 5/10  post session.   PT Next Visit Plan Assess IFC/ heat vs traction.  Consider Median  nerve dural stretch,     PT Home Exercise Plan stretch,     Consulted and Agree with Plan  of Care Patient      Patient will benefit from skilled therapeutic intervention in order to improve the following deficits and impairments:     Visit Diagnosis: Cervicalgia  Abnormal posture  Radiculopathy, cervical region  Pain in right upper arm  Paresthesia of skin     Problem List Patient Active Problem List   Diagnosis Date Noted  . Cervical radicular pain 03/22/2016  . Hamstring tightness 12/11/2014  . 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 Pain in joint, upper arm 11/17/2014  . Labyrinthitis 05/15/2012  . Sinusitis acute 02/12/2012  . Well adult exam 06/27/2011  . Rash, skin 06/27/2011  . ADHESIVE CAPSULITIS, LEFT 11/09/2009  . SHOULDER PAIN 09/09/2009  . HAIR LOSS 07/24/2008  . ELEVATED BP 07/24/2008  . CARPAL TUNNEL SYNDROME 03/23/2008  . PARESTHESIA 03/23/2008  . MIGRAINE HEADACHE 08/02/2007    Latalia Etzler 04/05/2016, 5:16 PM  [facility] [address] [place], [place], [address] Phone: [phone]   Fax:  [phone]  Name: Kelly Wall MRN: [id number] Date of Birth: [birth year]    Melvenia Needles, PTA 04/05/2016 5:16 PM Phone: [phone] Fax: [phone]

## 2016-04-07 ENCOUNTER — Ambulatory Visit: Payer: 59 | Admitting: Physical Therapy

## 2016-04-07 DIAGNOSIS — M5412 Radiculopathy, cervical region: Secondary | ICD-10-CM

## 2016-04-07 DIAGNOSIS — R202 Paresthesia of skin: Secondary | ICD-10-CM | POA: Diagnosis not present

## 2016-04-07 DIAGNOSIS — R293 Abnormal posture: Secondary | ICD-10-CM

## 2016-04-07 DIAGNOSIS — M542 Cervicalgia: Secondary | ICD-10-CM | POA: Diagnosis not present

## 2016-04-07 DIAGNOSIS — M79621 Pain in right upper arm: Secondary | ICD-10-CM

## 2016-04-07 NOTE — Therapy (Signed)
[place], [place], [address] Phone: [phone]   Fax:  [phone]  Physical Therapy Treatment  Patient Details  Name: Kelly Wall MRN: [id number] Date of Birth: Dec 31, 1960 Referring Provider: Dr. Lilia Argue  Encounter Date: 04/07/2016      PT End of Session - 04/07/16 1019    Visit Number 8   Number of Visits 16   Date for PT Re-Evaluation 05/17/16   PT Start Time 1017   PT Stop Time 1120   PT Time Calculation (min) 63 min      Past Medical History  Diagnosis Date  . Hypertension     No past surgical history on file.  There were no vitals filed for this visit.      Subjective Assessment - 04/07/16 1108    Pain Score 9    Pain Location Neck  upper trap, right preiscap                          [facility] Adult PT Treatment/Exercise - 04/07/16 0001    Ultrasound   Ultrasound Location Rt upper trap, periscap   Ultrasound Parameters 100%, continuous 1 Mhz x 8 min   Ultrasound Goals Pain   Traction   Min (lbs) 5   Max (lbs) 16   Time 17  protocol used for hol/ rest times :Disc invilvement with mus   Manual Therapy   Manual therapy comments neuromuscular trigger point release multiple.  rhomboids, traps                  PT Short Term Goals - 04/03/16 1616    PT SHORT TERM GOAL #1   Title Pt will be I with basic HEP for cervical stab, gentle stretching   Baseline independent with gentle stretching.     Time 4   Period Weeks   Status Partially Met   PT SHORT TERM GOAL #2   Title Pt will report 25% less symptoms in Rt. UE (pain, sensory)   Baseline not better yet   Time 4   Period Weeks   Status On-going   PT SHORT TERM GOAL #3   Title Pt will be comfortable in supine with pillows and tolerate for up to 15 min for treatment   Baseline intermittant comfortable supine in bed   Time 4   Period Weeks   Status On-going           PT Long Term  Goals - 03/31/16 1141    PT LONG TERM GOAL #1   Title Patient will be I with HEP   Status On-going   PT LONG TERM GOAL #2   Title Pt will be able to sit for 1 hour with no increase in pain in Rt. UE/neck   Status On-going   PT LONG TERM GOAL #3   Title Pt will be able to rotate and extend neck  to Rt. with min discomfort in neck for driving, conversation   Status On-going   PT LONG TERM GOAL #4   Title Pt will score <40% limited on FOTO to demo less functional limitation   Status On-going   PT LONG TERM GOAL #5   Title Triceps and biceps strength will be 4+/5 or more on Rt. side    Status On-going               Plan - 04/07/16 1106    Clinical Impression Statement Pain  with Trigger point release reduced to 6/10. Repeated ultrasound and Cervical traction. Answered questions about how traction could help. Pt did not like IFC.    PT Next Visit Plan Pt sees surgeon may 15th. Continue manual, ultrasound, traction ; Consider Median  nerve dural stretch,        Patient will benefit from skilled therapeutic intervention in order to improve the following deficits and impairments:  Decreased range of motion, Increased fascial restricitons, Pain, Impaired UE functional use, Increased muscle spasms, Hypomobility, Improper body mechanics, Impaired flexibility, Decreased mobility, Decreased strength, Impaired sensation, Postural dysfunction  Visit Diagnosis: Cervicalgia  Abnormal posture  Radiculopathy, cervical region  Pain in right upper arm  Paresthesia of skin     Problem List Patient Active Problem List   Diagnosis Date Noted  . Cervical radicular pain 03/22/2016  . Hamstring tightness 12/11/2014  . 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 Pain in joint, upper arm 11/17/2014  . Labyrinthitis 05/15/2012  . Sinusitis acute 02/12/2012  . Well adult exam 06/27/2011  . Rash, skin 06/27/2011  . ADHESIVE CAPSULITIS, LEFT 11/09/2009  . SHOULDER PAIN 09/09/2009  . HAIR LOSS 07/24/2008   . ELEVATED BP 07/24/2008  . CARPAL TUNNEL SYNDROME 03/23/2008  . PARESTHESIA 03/23/2008  . MIGRAINE HEADACHE 08/02/2007    Dorene Ar, PTA 04/07/2016, 11:13 AM  [facility] [address] [place], [place], [address] Phone: [phone]   Fax:  [phone]  Name: DARICE VICARIO MRN: [id number] Date of Birth: 1961-09-25

## 2016-04-10 ENCOUNTER — Encounter: Payer: Self-pay | Admitting: Physical Therapy

## 2016-04-10 ENCOUNTER — Ambulatory Visit: Payer: 59 | Admitting: Physical Therapy

## 2016-04-10 ENCOUNTER — Other Ambulatory Visit: Payer: Self-pay | Admitting: Neurological Surgery

## 2016-04-10 DIAGNOSIS — R293 Abnormal posture: Secondary | ICD-10-CM | POA: Diagnosis not present

## 2016-04-10 DIAGNOSIS — R202 Paresthesia of skin: Secondary | ICD-10-CM

## 2016-04-10 DIAGNOSIS — M542 Cervicalgia: Secondary | ICD-10-CM

## 2016-04-10 DIAGNOSIS — M79621 Pain in right upper arm: Secondary | ICD-10-CM

## 2016-04-10 DIAGNOSIS — M5412 Radiculopathy, cervical region: Secondary | ICD-10-CM

## 2016-04-10 DIAGNOSIS — R030 Elevated blood-pressure reading, without diagnosis of hypertension: Secondary | ICD-10-CM | POA: Diagnosis not present

## 2016-04-10 NOTE — Therapy (Addendum)
Kelly Wall, [place], [address] Phone: [phone]   Fax:  [phone]  Physical Therapy Treatment/Discharge Summary  Patient Details  Name: Kelly Wall MRN: [id number] Date of Birth: 1961/10/09 Referring Provider: Dr. Lilia Argue  Encounter Date: 04/10/2016      PT End of Session - 04/10/16 1238    Visit Number 9   Number of Visits 16   Date for PT Re-Evaluation 05/17/16   PT Start Time 1237   PT Stop Time 1345   PT Time Calculation (min) 68 min      Past Medical History  Diagnosis Date  . Hypertension     History reviewed. No pertinent past surgical history.  There were no vitals filed for this visit.      Subjective Assessment - 04/10/16 1359    Subjective throbbing in shoulder with pain traveling to her elbow. tingling in R hand.    Currently in Pain? Yes   Pain Location Arm   Pain Orientation Right   Pain Descriptors / Indicators Aching;Throbbing;Stabbing  feels like an open wound on the back                         [facility] Adult PT Treatment/Exercise - 04/10/16 0001    Modalities   Modalities Cryotherapy   Cryotherapy   Number Minutes Cryotherapy 10 Minutes   Cryotherapy Location Other (comment)  chest    Type of Cryotherapy Ice pack   Ultrasound   Ultrasound Location post T3 rib and surrounding musculature.    Ultrasound Parameters 100% 1 mhz continuous 5 min   Ultrasound Goals Pain   Manual Therapy   Manual therapy comments neuromuscular trigger point release multiple.  rhomboids, traps, subscap. R sideglides lower cervical. anterior T3 rib mobilization. Median nerve glides                PT Education - 04/10/16 1401    Education provided Yes   Education Details referral and radicular pain; rib rotations; manual techniques   Person(s) Educated Patient   Methods Explanation   Comprehension Verbalized understanding          PT Short  Term Goals - 04/03/16 1616    PT SHORT TERM GOAL #1   Title Pt will be I with basic HEP for cervical stab, gentle stretching   Baseline independent with gentle stretching.     Time 4   Period Weeks   Status Partially Met   PT SHORT TERM GOAL #2   Title Pt will report 25% less symptoms in Rt. UE (pain, sensory)   Baseline not better yet   Time 4   Period Weeks   Status On-going   PT SHORT TERM GOAL #3   Title Pt will be comfortable in supine with pillows and tolerate for up to 15 min for treatment   Baseline intermittant comfortable supine in bed   Time 4   Period Weeks   Status On-going           PT Long Term Goals - 03/31/16 1141    PT LONG TERM GOAL #1   Title Patient will be I with HEP   Status On-going   PT LONG TERM GOAL #2   Title Pt will be able to sit for 1 hour with no increase in pain in Rt. UE/neck   Status On-going   PT LONG TERM GOAL #3   Title Pt will be able to  rotate and extend neck  to Rt. with min discomfort in neck for driving, conversation   Status On-going   PT LONG TERM GOAL #4   Title Pt will score <40% limited on FOTO to demo less functional limitation   Status On-going   PT LONG TERM GOAL #5   Title Triceps and biceps strength will be 4+/5 or more on Rt. side    Status On-going               Plan - 04/10/16 1351    Clinical Impression Statement Pt reported high pain today beginning in her upper back and into her arm. Notable hypomobility in anterior T3 rib on R with poor alignment. Alignment corrected with applied pressure in sync with breaths which improved alignment, muscle spasm occured a few minutes following while laying supine which pt could feel along the rib. Decreased muscle turgor in rhomboids, upper trap and subscap following treatment today.    PT Next Visit Plan review results of neuro surgeon visit.    Consulted and Agree with Plan of Care Patient      Patient will benefit from skilled therapeutic intervention in order  to improve the following deficits and impairments:  Decreased range of motion, Increased fascial restricitons, Pain, Impaired UE functional use, Increased muscle spasms, Hypomobility, Improper body mechanics, Impaired flexibility, Decreased mobility, Decreased strength, Impaired sensation, Postural dysfunction  Visit Diagnosis: Cervicalgia  Abnormal posture  Radiculopathy, cervical region  Pain in right upper arm  Paresthesia of skin     Problem List Patient Active Problem List   Diagnosis Date Noted  . Cervical radicular pain 03/22/2016  . Hamstring tightness 12/11/2014  . 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 Pain in joint, upper arm 11/17/2014  . Labyrinthitis 05/15/2012  . Sinusitis acute 02/12/2012  . Well adult exam 06/27/2011  . Rash, skin 06/27/2011  . ADHESIVE CAPSULITIS, LEFT 11/09/2009  . SHOULDER PAIN 09/09/2009  . HAIR LOSS 07/24/2008  . ELEVATED BP 07/24/2008  . CARPAL TUNNEL SYNDROME 03/23/2008  . PARESTHESIA 03/23/2008  . MIGRAINE HEADACHE 08/02/2007    Muhamad Serano C. Eddison Searls PT, DPT 04/10/2016 2:06 PM   [facility] [address] [place], [place], [address] Phone: [phone]   Fax:  [phone]  Name: Kelly Wall MRN: [id number] Date of Birth: [birth year]   PHYSICAL THERAPY DISCHARGE SUMMARY  Visits from Start of Care: 9  Current functional level related to goals / functional outcomes: See above   Remaining deficits: See above   Education / Equipment: Anatomy of condition, POC, HEP, exercise form/rationale  Plan: Patient agrees to discharge.  Patient goals were not met. Patient is being discharged due to a change in medical status.  ?????    Kristina Mcnorton C. Azavion Bouillon PT, DPT 07/06/16 5:57 PM

## 2016-04-12 ENCOUNTER — Encounter: Payer: 59 | Admitting: Physical Therapy

## 2016-04-14 ENCOUNTER — Telehealth: Payer: Self-pay | Admitting: *Deleted

## 2016-04-14 NOTE — Telephone Encounter (Signed)
Pt left a vm stating she is having Artificial Disc Replacement-Cervical - C6-C7 on 04/20/16. The surgeon wants her to stop her Fish oil 1000 mg 5/day prior to surgery. She wants to know if this is ok with you and what she can take in it's place for her blood pressure. Please advise.

## 2016-04-15 NOTE — Pre-Procedure Instructions (Addendum)
Kelly RocheRosario P Wall  04/15/2016       Your procedure is scheduled on May 25.  [facility] at 12 P.M.  Call this number if you have problems the morning of surgery:  [phone]   Remember:  Do not eat food or drink liquids after midnight.  Take these medicines the morning of surgery with A SIP OF WATER Tylenol (if needed),    STOP Vitamin C, Fish Oil, Vitamin B12, Vitamin D, Biotin today   STOP/ Do not take Aspirin, Aleve, Naproxen, Advil, Ibuprofen, meloxicam,Motrin, Vitamins, Herbs, or Supplements starting today   Do not wear jewelry, make-up or nail polish.  Do not wear lotions, powders, or perfumes.  You may not wear deodorant.  Do not shave 48 hours prior to surgery.  Men may shave face and neck.  Do not bring valuables to the hospital.  [facility] is not responsible for any belongings or valuables.  Contacts, dentures or bridgework may not be worn into surgery.  Leave your suitcase in the car.  After surgery it may be brought to your room.  For patients admitted to the hospital, discharge time will be determined by your treatment team.  Patients discharged the day of surgery will not be allowed to drive home.   [facility] - Preparing for Surgery  Before surgery, you can play an important role.  Because skin is not sterile, your skin needs to be as free of germs as possible.  You can reduce the number of germs on you skin by washing with CHG (chlorahexidine gluconate) soap before surgery.  CHG is an antiseptic cleaner which kills germs and bonds with the skin to continue killing germs even after washing.  Please DO NOT use if you have an allergy to CHG or antibacterial soaps.  If your skin becomes reddened/irritated stop using the CHG and inform your nurse when you arrive at Short Stay.  Do not shave (including legs and underarms) for at least 48 hours prior to the first CHG shower.  You may shave your face.  Please follow these  instructions carefully:   1.  Shower with CHG Soap the night before surgery and the morning of Surgery.  2.  If you choose to wash your hair, wash your hair first as usual with your normal shampoo.  3.  After you shampoo, rinse your hair and body thoroughly to remove the shampoo.  4.  Use CHG as you would any other liquid soap.  You can apply CHG directly to the skin and wash gently with scrungie or a clean washcloth.  5.  Apply the CHG Soap to your body ONLY FROM THE NECK DOWN.  Do not use on open wounds or open sores.  Avoid contact with your eyes, ears, mouth and genitals (private parts).  Wash genitals (private parts) with your normal soap.  6.  Wash thoroughly, paying special attention to the area where your surgery will be performed.  7.  Thoroughly rinse your body with warm water from the neck down.  8.  DO NOT shower/wash with your normal soap after using and rinsing off the CHG Soap.  9.  Pat yourself dry with a clean towel.            10.  Wear clean pajamas.            11.  Place clean sheets on your bed the night of your first shower and do not sleep with pets.  Day of Surgery  Do not apply any lotions the morning of surgery.  Please wear clean clothes to the hospital/surgery center.

## 2016-04-16 NOTE — Telephone Encounter (Signed)
OK to stop fish oil Thx

## 2016-04-17 ENCOUNTER — Encounter (HOSPITAL_COMMUNITY)
Admission: RE | Admit: 2016-04-17 | Discharge: 2016-04-17 | Disposition: A | Discharge status: Home / Self Care | Payer: 59 | Source: Ambulatory Visit | Attending: Neurological Surgery | Admitting: Neurological Surgery

## 2016-04-17 ENCOUNTER — Encounter (HOSPITAL_COMMUNITY): Payer: Self-pay

## 2016-04-17 ENCOUNTER — Ambulatory Visit (HOSPITAL_COMMUNITY)
Admission: RE | Admit: 2016-04-17 | Discharge: 2016-04-17 | Disposition: A | Discharge status: Home / Self Care | Payer: 59 | Source: Ambulatory Visit | Attending: Neurological Surgery | Admitting: Neurological Surgery

## 2016-04-17 DIAGNOSIS — Z01812 Encounter for preprocedural laboratory examination: Secondary | ICD-10-CM | POA: Insufficient documentation

## 2016-04-17 DIAGNOSIS — Z01818 Encounter for other preprocedural examination: Secondary | ICD-10-CM | POA: Diagnosis not present

## 2016-04-17 DIAGNOSIS — IMO0002 Reserved for concepts with insufficient information to code with codable children: Secondary | ICD-10-CM

## 2016-04-17 DIAGNOSIS — R001 Bradycardia, unspecified: Secondary | ICD-10-CM | POA: Insufficient documentation

## 2016-04-17 HISTORY — DX: Other specified health status: Z78.9

## 2016-04-17 HISTORY — DX: Unspecified asthma, uncomplicated: J45.909

## 2016-04-17 HISTORY — DX: Family history of other specified conditions: Z84.89

## 2016-04-17 LAB — CBC WITH DIFFERENTIAL/PLATELET
Basophils Absolute: 0 10*3/uL (ref 0.0–0.1)
Basophils Relative: 0 %
Eosinophils Absolute: 0.1 10*3/uL (ref 0.0–0.7)
Eosinophils Relative: 1 %
HCT: 39.2 % (ref 36.0–46.0)
Hemoglobin: 13.7 g/dL (ref 12.0–15.0)
Lymphocytes Relative: 24 %
Lymphs Abs: 1.7 10*3/uL (ref 0.7–4.0)
MCH: 32.2 pg (ref 26.0–34.0)
MCHC: 34.9 g/dL (ref 30.0–36.0)
MCV: 92.2 fL (ref 78.0–100.0)
Monocytes Absolute: 0.4 10*3/uL (ref 0.1–1.0)
Monocytes Relative: 6 %
Neutro Abs: 4.8 10*3/uL (ref 1.7–7.7)
Neutrophils Relative %: 69 %
Platelets: 232 10*3/uL (ref 150–400)
RBC: 4.25 MIL/uL (ref 3.87–5.11)
RDW: 12 % (ref 11.5–15.5)
WBC: 7 10*3/uL (ref 4.0–10.5)

## 2016-04-17 LAB — PROTIME-INR
INR: 0.96 (ref 0.00–1.49)
Prothrombin Time: 13 seconds (ref 11.6–15.2)

## 2016-04-17 LAB — SURGICAL PCR SCREEN
MRSA, PCR: NEGATIVE
Staphylococcus aureus: NEGATIVE

## 2016-04-17 LAB — BASIC METABOLIC PANEL
Anion gap: 8 (ref 5–15)
BUN: 17 mg/dL (ref 6–20)
CO2: 24 mmol/L (ref 22–32)
Calcium: 9.2 mg/dL (ref 8.9–10.3)
Chloride: 104 mmol/L (ref 101–111)
Creatinine, Ser: 0.73 mg/dL (ref 0.44–1.00)
GFR calc Af Amer: >60 mL/min (ref >60)
GFR calc non Af Amer: >60 mL/min (ref >60)
Glucose, Bld: 94 mg/dL (ref 65–99)
Potassium: 3.6 mmol/L (ref 3.5–5.1)
Sodium: 136 mmol/L (ref 135–145)

## 2016-04-17 NOTE — Telephone Encounter (Signed)
Left detailed mess informing pt of below.  

## 2016-04-19 MED ORDER — DEXAMETHASONE SODIUM PHOSPHATE 10 MG/ML IJ SOLN
10.0000 mg | INTRAMUSCULAR | Status: AC
  Administered 2016-04-20: 10 mg via INTRAVENOUS
  Filled 2016-04-19: qty 1

## 2016-04-19 MED ORDER — CEFAZOLIN SODIUM-DEXTROSE 2-4 GM/100ML-% IV SOLN
2.0000 g | INTRAVENOUS | Status: AC
  Administered 2016-04-20: 2 g via INTRAVENOUS
  Filled 2016-04-19: qty 100

## 2016-04-20 ENCOUNTER — Encounter (HOSPITAL_COMMUNITY)
Admission: RE | Disposition: A | Discharge status: Home / Self Care | Payer: Self-pay | Source: Ambulatory Visit | Attending: Neurological Surgery

## 2016-04-20 ENCOUNTER — Inpatient Hospital Stay (HOSPITAL_COMMUNITY): Payer: 59 | Admitting: Certified Registered Nurse Anesthetist

## 2016-04-20 ENCOUNTER — Inpatient Hospital Stay (HOSPITAL_COMMUNITY): Payer: 59

## 2016-04-20 ENCOUNTER — Encounter (HOSPITAL_COMMUNITY): Payer: Self-pay | Admitting: *Deleted

## 2016-04-20 ENCOUNTER — Ambulatory Visit (HOSPITAL_COMMUNITY)
Admission: RE | Admit: 2016-04-20 | Discharge: 2016-04-21 | Disposition: A | Discharge status: Home / Self Care | Payer: 59 | Source: Ambulatory Visit | Attending: Neurological Surgery | Admitting: Neurological Surgery

## 2016-04-20 DIAGNOSIS — M5020 Other cervical disc displacement, unspecified cervical region: Secondary | ICD-10-CM | POA: Diagnosis not present

## 2016-04-20 DIAGNOSIS — M1990 Unspecified osteoarthritis, unspecified site: Secondary | ICD-10-CM | POA: Diagnosis not present

## 2016-04-20 DIAGNOSIS — M50223 Other cervical disc displacement at C6-C7 level: Secondary | ICD-10-CM | POA: Diagnosis not present

## 2016-04-20 DIAGNOSIS — M5022 Other cervical disc displacement, mid-cervical region, unspecified level: Secondary | ICD-10-CM | POA: Diagnosis not present

## 2016-04-20 DIAGNOSIS — M4802 Spinal stenosis, cervical region: Secondary | ICD-10-CM | POA: Diagnosis not present

## 2016-04-20 DIAGNOSIS — Z419 Encounter for procedure for purposes other than remedying health state, unspecified: Secondary | ICD-10-CM

## 2016-04-20 DIAGNOSIS — I10 Essential (primary) hypertension: Secondary | ICD-10-CM | POA: Insufficient documentation

## 2016-04-20 DIAGNOSIS — M542 Cervicalgia: Secondary | ICD-10-CM | POA: Diagnosis not present

## 2016-04-20 DIAGNOSIS — Z981 Arthrodesis status: Secondary | ICD-10-CM

## 2016-04-20 HISTORY — PX: CERVICAL DISC ARTHROPLASTY: SHX587

## 2016-04-20 SURGERY — CERVICAL ANTERIOR DISC ARTHROPLASTY
Anesthesia: General | Site: Spine Cervical

## 2016-04-20 MED ORDER — ONDANSETRON HCL 4 MG/2ML IJ SOLN
INTRAMUSCULAR | Status: DC | PRN
  Administered 2016-04-20: 4 mg via INTRAVENOUS

## 2016-04-20 MED ORDER — ROCURONIUM BROMIDE 50 MG/5ML IV SOLN
INTRAVENOUS | Status: AC
  Filled 2016-04-20: qty 1

## 2016-04-20 MED ORDER — LACTATED RINGERS IV SOLN: INTRAVENOUS | Status: DC

## 2016-04-20 MED ORDER — METOCLOPRAMIDE HCL 5 MG/ML IJ SOLN
10.0000 mg | Freq: Once | INTRAMUSCULAR | Status: AC | PRN
Start: 2016-04-20 — End: 2016-04-20
  Administered 2016-04-20: 10 mg via INTRAVENOUS

## 2016-04-20 MED ORDER — SUGAMMADEX SODIUM 200 MG/2ML IV SOLN
INTRAVENOUS | Status: DC | PRN
  Administered 2016-04-20: 200 mg via INTRAVENOUS

## 2016-04-20 MED ORDER — PHENYLEPHRINE 40 MCG/ML (10ML) SYRINGE FOR IV PUSH (FOR BLOOD PRESSURE SUPPORT)
PREFILLED_SYRINGE | INTRAVENOUS | Status: AC
  Filled 2016-04-20: qty 10

## 2016-04-20 MED ORDER — SODIUM CHLORIDE 0.9 % IR SOLN
Status: DC | PRN
  Administered 2016-04-20: 14:00:00

## 2016-04-20 MED ORDER — MORPHINE SULFATE (PF) 2 MG/ML IV SOLN: 1.0000 mg | INTRAVENOUS | Status: DC | PRN

## 2016-04-20 MED ORDER — LIDOCAINE 2% (20 MG/ML) 5 ML SYRINGE
INTRAMUSCULAR | Status: AC
  Filled 2016-04-20: qty 5

## 2016-04-20 MED ORDER — MIDAZOLAM HCL 5 MG/5ML IJ SOLN
INTRAMUSCULAR | Status: DC | PRN
  Administered 2016-04-20: 2 mg via INTRAVENOUS

## 2016-04-20 MED ORDER — LACTATED RINGERS IV SOLN
INTRAVENOUS | Status: DC
  Administered 2016-04-20 (×2): via INTRAVENOUS

## 2016-04-20 MED ORDER — FENTANYL CITRATE (PF) 100 MCG/2ML IJ SOLN
INTRAMUSCULAR | Status: AC
  Administered 2016-04-20: 50 ug via INTRAVENOUS
  Filled 2016-04-20: qty 2

## 2016-04-20 MED ORDER — METHOCARBAMOL 500 MG PO TABS: 500.0000 mg | ORAL_TABLET | Freq: Four times a day (QID) | ORAL | Status: DC | PRN

## 2016-04-20 MED ORDER — PHENOL 1.4 % MT LIQD
1.0000 | OROMUCOSAL | Status: DC | PRN
  Filled 2016-04-20: qty 177

## 2016-04-20 MED ORDER — MENTHOL 3 MG MT LOZG: 1.0000 | LOZENGE | OROMUCOSAL | Status: DC | PRN

## 2016-04-20 MED ORDER — FENTANYL CITRATE (PF) 250 MCG/5ML IJ SOLN
INTRAMUSCULAR | Status: AC
  Filled 2016-04-20: qty 5

## 2016-04-20 MED ORDER — DEXAMETHASONE SODIUM PHOSPHATE 10 MG/ML IJ SOLN
INTRAMUSCULAR | Status: AC
  Filled 2016-04-20: qty 1

## 2016-04-20 MED ORDER — SUGAMMADEX SODIUM 200 MG/2ML IV SOLN
INTRAVENOUS | Status: AC
  Filled 2016-04-20: qty 2

## 2016-04-20 MED ORDER — PROPOFOL 10 MG/ML IV BOLUS
INTRAVENOUS | Status: DC | PRN
  Administered 2016-04-20: 120 mg via INTRAVENOUS

## 2016-04-20 MED ORDER — ONDANSETRON HCL 4 MG/2ML IJ SOLN
INTRAMUSCULAR | Status: AC
  Filled 2016-04-20: qty 2

## 2016-04-20 MED ORDER — ACETAMINOPHEN 650 MG RE SUPP: 650.0000 mg | RECTAL | Status: DC | PRN

## 2016-04-20 MED ORDER — LIDOCAINE 2% (20 MG/ML) 5 ML SYRINGE
INTRAMUSCULAR | Status: DC | PRN
  Administered 2016-04-20: 100 mg via INTRAVENOUS

## 2016-04-20 MED ORDER — HYDROCODONE-ACETAMINOPHEN 5-325 MG PO TABS
1.0000 | ORAL_TABLET | ORAL | Status: DC | PRN
  Administered 2016-04-20: 2 via ORAL

## 2016-04-20 MED ORDER — EPHEDRINE SULFATE 50 MG/ML IJ SOLN
INTRAMUSCULAR | Status: DC | PRN
  Administered 2016-04-20: 5 mg via INTRAVENOUS
  Administered 2016-04-20: 10 mg via INTRAVENOUS

## 2016-04-20 MED ORDER — METHOCARBAMOL 1000 MG/10ML IJ SOLN
500.0000 mg | Freq: Four times a day (QID) | INTRAVENOUS | Status: DC | PRN
  Filled 2016-04-20: qty 5

## 2016-04-20 MED ORDER — CEFAZOLIN SODIUM 1 G IJ SOLR
INTRAMUSCULAR | Status: AC
  Filled 2016-04-20: qty 20

## 2016-04-20 MED ORDER — FENTANYL CITRATE (PF) 250 MCG/5ML IJ SOLN
INTRAMUSCULAR | Status: DC | PRN
  Administered 2016-04-20 (×2): 50 ug via INTRAVENOUS

## 2016-04-20 MED ORDER — METOCLOPRAMIDE HCL 5 MG/ML IJ SOLN
INTRAMUSCULAR | Status: AC
  Administered 2016-04-20: 10 mg via INTRAVENOUS
  Filled 2016-04-20: qty 2

## 2016-04-20 MED ORDER — PROPOFOL 10 MG/ML IV BOLUS
INTRAVENOUS | Status: AC
  Filled 2016-04-20: qty 20

## 2016-04-20 MED ORDER — MEPERIDINE HCL 25 MG/ML IJ SOLN: 6.2500 mg | INTRAMUSCULAR | Status: DC | PRN

## 2016-04-20 MED ORDER — ROCURONIUM BROMIDE 100 MG/10ML IV SOLN
INTRAVENOUS | Status: DC | PRN
  Administered 2016-04-20: 50 mg via INTRAVENOUS

## 2016-04-20 MED ORDER — SODIUM CHLORIDE 0.9% FLUSH: 3.0000 mL | Freq: Two times a day (BID) | INTRAVENOUS | Status: DC

## 2016-04-20 MED ORDER — SUCCINYLCHOLINE CHLORIDE 200 MG/10ML IV SOSY
PREFILLED_SYRINGE | INTRAVENOUS | Status: AC
  Filled 2016-04-20: qty 10

## 2016-04-20 MED ORDER — MORPHINE SULFATE (PF) 4 MG/ML IV SOLN
INTRAVENOUS | Status: AC
  Administered 2016-04-20: 4 mg
  Filled 2016-04-20: qty 1

## 2016-04-20 MED ORDER — SODIUM CHLORIDE 0.9% FLUSH: 3.0000 mL | INTRAVENOUS | Status: DC | PRN

## 2016-04-20 MED ORDER — THROMBIN 5000 UNITS EX SOLR
OROMUCOSAL | Status: DC | PRN
  Administered 2016-04-20: 14:00:00 via TOPICAL

## 2016-04-20 MED ORDER — POTASSIUM CHLORIDE IN NACL 20-0.9 MEQ/L-% IV SOLN
INTRAVENOUS | Status: DC
  Filled 2016-04-20 (×3): qty 1000

## 2016-04-20 MED ORDER — CEFAZOLIN SODIUM 1-5 GM-% IV SOLN
1.0000 g | Freq: Three times a day (TID) | INTRAVENOUS | Status: AC
  Administered 2016-04-20 – 2016-04-21 (×2): 1 g via INTRAVENOUS
  Filled 2016-04-20 (×2): qty 50

## 2016-04-20 MED ORDER — 0.9 % SODIUM CHLORIDE (POUR BTL) OPTIME
TOPICAL | Status: DC | PRN
  Administered 2016-04-20: 1000 mL

## 2016-04-20 MED ORDER — ACETAMINOPHEN 325 MG PO TABS
650.0000 mg | ORAL_TABLET | ORAL | Status: DC | PRN
  Administered 2016-04-21: 650 mg via ORAL
  Filled 2016-04-20: qty 2

## 2016-04-20 MED ORDER — SODIUM CHLORIDE 0.9 % IV SOLN: 250.0000 mL | INTRAVENOUS | Status: DC

## 2016-04-20 MED ORDER — HYDROCODONE-ACETAMINOPHEN 5-325 MG PO TABS
ORAL_TABLET | ORAL | Status: AC
  Filled 2016-04-20: qty 2

## 2016-04-20 MED ORDER — ONDANSETRON HCL 4 MG/2ML IJ SOLN
4.0000 mg | INTRAMUSCULAR | Status: DC | PRN
  Administered 2016-04-20 – 2016-04-21 (×2): 4 mg via INTRAVENOUS
  Filled 2016-04-20: qty 2

## 2016-04-20 MED ORDER — THROMBIN 20000 UNITS EX SOLR
CUTANEOUS | Status: DC | PRN
  Administered 2016-04-20: 14:00:00 via TOPICAL

## 2016-04-20 MED ORDER — FENTANYL CITRATE (PF) 100 MCG/2ML IJ SOLN
25.0000 ug | INTRAMUSCULAR | Status: DC | PRN
  Administered 2016-04-20 (×3): 50 ug via INTRAVENOUS

## 2016-04-20 MED ORDER — EPHEDRINE 5 MG/ML INJ
INTRAVENOUS | Status: AC
  Filled 2016-04-20: qty 10

## 2016-04-20 MED ORDER — MIDAZOLAM HCL 2 MG/2ML IJ SOLN
INTRAMUSCULAR | Status: AC
  Filled 2016-04-20: qty 2

## 2016-04-20 SURGICAL SUPPLY — 40 items
BAG DECANTER FOR FLEXI CONT (MISCELLANEOUS) ×2 IMPLANT
BENZOIN TINCTURE PRP APPL 2/3 (GAUZE/BANDAGES/DRESSINGS) ×2 IMPLANT
BIT DRILL PRESTIGE (BIT) ×2 IMPLANT
BUR MATCHSTICK NEURO 3.0 LAGG (BURR) ×2 IMPLANT
CANISTER SUCT 3000ML PPV (MISCELLANEOUS) ×2 IMPLANT
DISC CERVICAL PRESTIGE 6X16 (Orthopedic Implant) ×2 IMPLANT
DRAPE C-ARM 42X72 X-RAY (DRAPES) ×4 IMPLANT
DRAPE LAPAROTOMY 100X72 PEDS (DRAPES) ×2 IMPLANT
DRAPE MICROSCOPE LEICA (MISCELLANEOUS) ×2 IMPLANT
DRAPE POUCH INSTRU U-SHP 10X18 (DRAPES) ×2 IMPLANT
DRSG OPSITE POSTOP 3X4 (GAUZE/BANDAGES/DRESSINGS) ×2 IMPLANT
DURAPREP 6ML APPLICATOR 50/CS (WOUND CARE) ×2 IMPLANT
ELECT COATED BLADE 2.86 ST (ELECTRODE) ×2 IMPLANT
ELECT REM PT RETURN 9FT ADLT (ELECTROSURGICAL) ×2
ELECTRODE REM PT RTRN 9FT ADLT (ELECTROSURGICAL) ×1 IMPLANT
GAUZE SPONGE 4X4 16PLY XRAY LF (GAUZE/BANDAGES/DRESSINGS) IMPLANT
GLOVE BIO SURGEON STRL SZ8 (GLOVE) ×2 IMPLANT
GOWN STRL REUS W/ TWL LRG LVL3 (GOWN DISPOSABLE) IMPLANT
GOWN STRL REUS W/ TWL XL LVL3 (GOWN DISPOSABLE) ×1 IMPLANT
GOWN STRL REUS W/TWL 2XL LVL3 (GOWN DISPOSABLE) IMPLANT
GOWN STRL REUS W/TWL LRG LVL3 (GOWN DISPOSABLE)
GOWN STRL REUS W/TWL XL LVL3 (GOWN DISPOSABLE) ×2
HALTER HD/CHIN CERV TRACTION D (MISCELLANEOUS) IMPLANT
HEMOSTAT POWDER KIT SURGIFOAM (HEMOSTASIS) ×2 IMPLANT
KIT BASIN OR (CUSTOM PROCEDURE TRAY) ×2 IMPLANT
KIT ROOM TURNOVER OR (KITS) ×2 IMPLANT
NEEDLE HYPO 25X1 1.5 SAFETY (NEEDLE) ×2 IMPLANT
NEEDLE SPNL 20GX3.5 QUINCKE YW (NEEDLE) ×2 IMPLANT
NS IRRIG 1000ML POUR BTL (IV SOLUTION) ×2 IMPLANT
PACK LAMINECTOMY NEURO (CUSTOM PROCEDURE TRAY) ×2 IMPLANT
PAD ARMBOARD 7.5X6 YLW CONV (MISCELLANEOUS) ×2 IMPLANT
RUBBERBAND STERILE (MISCELLANEOUS) ×4 IMPLANT
SPONGE INTESTINAL PEANUT (DISPOSABLE) ×2 IMPLANT
SPONGE SURGIFOAM ABS GEL 100 (HEMOSTASIS) ×2 IMPLANT
STRIP CLOSURE SKIN 1/2X4 (GAUZE/BANDAGES/DRESSINGS) ×2 IMPLANT
SUT VIC AB 3-0 SH 8-18 (SUTURE) ×2 IMPLANT
TOWEL OR 17X24 6PK STRL BLUE (TOWEL DISPOSABLE) ×2 IMPLANT
TOWEL OR 17X26 10 PK STRL BLUE (TOWEL DISPOSABLE) ×2 IMPLANT
TRAP SPECIMEN MUCOUS 40CC (MISCELLANEOUS) IMPLANT
WATER STERILE IRR 1000ML POUR (IV SOLUTION) ×2 IMPLANT

## 2016-04-20 NOTE — Anesthesia Preprocedure Evaluation (Addendum)
Anesthesia Evaluation  Patient identified by MRN, date of birth, ID band Patient awake    History of Anesthesia Complications Negative for: history of anesthetic complications  Airway Mallampati: I   Neck ROM: Limited    Dental  (+) Partial Upper, Partial Lower, Dental Advisory Given, Missing   Pulmonary neg pulmonary ROS, asthma ,    breath sounds clear to auscultation       Cardiovascular Exercise Tolerance: Good hypertension,  Rhythm:Regular Rate:Normal     Neuro/Psych  Headaches,  Neuromuscular disease negative psych ROS   GI/Hepatic negative GI ROS, Neg liver ROS,   Endo/Other  negative endocrine ROS  Renal/GU negative Renal ROS  negative genitourinary   Musculoskeletal  (+) Arthritis , Osteoarthritis,    Abdominal (+) - obese,  Abdomen: soft. Bowel sounds: normal.  Peds negative pediatric ROS (+)  Hematology negative hematology ROS (+)   Anesthesia Other Findings   Reproductive/Obstetrics negative OB ROS                           BP Readings from Last 3 Encounters:  04/20/16 127/83  04/17/16 134/83  03/29/16 120/62   Lab Results  Component Value Date   WBC 7.0 04/17/2016   HGB 13.7 04/17/2016   HCT 39.2 04/17/2016   MCV 92.2 04/17/2016   PLT 232 04/17/2016     Chemistry      Component Value Date/Time   NA 136 04/17/2016 0843   K 3.6 04/17/2016 0843   CL 104 04/17/2016 0843   CO2 24 04/17/2016 0843   BUN 17 04/17/2016 0843   CREATININE 0.73 04/17/2016 0843   CREATININE 0.68 06/23/2011 0845      Component Value Date/Time   CALCIUM 9.2 04/17/2016 0843   ALKPHOS 54 10/09/2013 0726   AST 30 10/09/2013 0726   ALT 25 10/09/2013 0726   BILITOT 0.4 10/09/2013 0726      Anesthesia Physical Anesthesia Plan  ASA: II  Anesthesia Plan: General   Post-op Pain Management:    Induction: Intravenous  Airway Management Planned: Oral ETT  Additional Equipment:    Intra-op Plan:   Post-operative Plan: Extubation in OR  Informed Consent: I have reviewed the patients History and Physical, chart, labs and discussed the procedure including the risks, benefits and alternatives for the proposed anesthesia with the patient or authorized representative who has indicated his/her understanding and acceptance.   Dental advisory given  Plan Discussed with: CRNA, Anesthesiologist and Surgeon  Anesthesia Plan Comments:         Anesthesia Quick Evaluation

## 2016-04-20 NOTE — Transfer of Care (Signed)
Immediate Anesthesia Transfer of Care Note  Patient: Kelly Wall  Procedure(s) Performed: Procedure(s): Cervical Six-Seven Artificial Disc Replacement-Cervical (N/A)  Patient Location: PACU  Anesthesia Type:General  Level of Consciousness: sedated and patient cooperative  Airway & Oxygen Therapy: Patient Spontanous Breathing and Patient connected to nasal cannula oxygen  Post-op Assessment: Report given to RN, Post -op Vital signs reviewed and stable and Patient moving all extremities X 4  Post vital signs: Reviewed and stable  Last Vitals:  Filed Vitals:   04/20/16 1212  BP: 127/83  Pulse: 67  Temp: 36.9 C  Resp: 18    Last Pain:  Filed Vitals:   04/20/16 1213  PainSc: 0-No pain      Patients Stated Pain Goal: 5 (04/20/16 1212)  Complications: No apparent anesthesia complications

## 2016-04-20 NOTE — H&P (Signed)
Subjective:   Patient is a 55 y.o. female admitted for neck and arm pain. The patient first presented to me with complaints of neck pain, shooting pains in the arm(s), numbness of the arm(s) and loss of strength of the arm(s). Onset of symptoms was several months ago. The pain is described as aching and occurs all day. The pain is rated moderate, and is located  In the neck and radiates to the arm. The symptoms have been progressive. Symptoms are exacerbated by extending head backwards, and are relieved by none.  Previous work up includes MRI of cervical spine, results: disc bulge at C6-C7 right.  Past Medical History  Diagnosis Date  . Medical history non-contributory     no anesthesia  . Family history of adverse reaction to anesthesia     dad hard to awaken  . Hypertension     no rx  in 5 yrs fish oil helps now  . Asthma     child- occ exersie induced now    History reviewed. No pertinent past surgical history.  Allergies  Allergen Reactions  . Butorphanol Tartrate Shortness Of Breath  . Tramadol Shortness Of Breath  . Flexeril [Cyclobenzaprine] Other (See Comments)    Knocks her out  . Hydrochlorothiazide W-Triamterene     REACTION: dizziness  . Propranolol Hcl Other (See Comments)    Chest pain  . Tetracycline Hcl Other (See Comments)    Bleeding in throat    Social History  Substance Use Topics  . Smoking status: Never Smoker   . Smokeless tobacco: Not on file  . Alcohol Use: No    Family History  Problem Relation Age of Onset  . Hypertension Other    Prior to Admission medications   Medication Sig Start Date End Date Taking? Authorizing Provider  acetaminophen (TYLENOL) 500 MG tablet Take 500 mg by mouth at bedtime as needed (for throbbing pain).   Yes Historical Provider, MD  Biotin 5000 MCG CAPS Take 15,000 mcg by mouth at bedtime.   Yes Historical Provider, MD  Cholecalciferol (EQL VITAMIN D3) 1000 UNITS tablet Take 5,000 Units by mouth daily.    Yes Historical  Provider, MD  clindamycin (CLEOCIN T) 1 % lotion Apply 1 application topically every morning.   Yes Historical Provider, MD  Cyanocobalamin (VITAMIN B-12 PO) Take 1,000 mcg by mouth daily. Gummies   Yes Historical Provider, MD  estrogen, conjugated,-medroxyprogesterone (PREMPRO) 0.625-2.5 MG tablet Take 1 tablet by mouth at bedtime as needed.   Yes Historical Provider, MD  meloxicam (MOBIC) 15 MG tablet Take one tablet a day for 7 days, then take as needed. Take with food. Patient taking differently: Take 15 mg by mouth daily.  03/22/16  Yes Ralene Cork, DO  Omega-3 Fatty Acids (FISH OIL) 1000 MG CAPS Take 4,000 mg by mouth daily.    Yes Historical Provider, MD  tretinoin (RETIN-A) 0.025 % cream Apply 1 application topically at bedtime.   Yes Historical Provider, MD  vitamin C (ASCORBIC ACID) 250 MG tablet Take 500 mg by mouth daily.   Yes Historical Provider, MD     Review of Systems  Positive ROS: neg  All other systems have been reviewed and were otherwise negative with the exception of those mentioned in the HPI and as above.  Objective: Vital signs in last 24 hours: Temp:  [98.4 F (36.9 C)] 98.4 F (36.9 C) (05/25 1212) Pulse Rate:  [67] 67 (05/25 1212) Resp:  [18] 18 (05/25 1212) BP: (127)/(83) 127/83  mmHg (05/25 1212) SpO2:  [100 %] 100 % (05/25 1212) Weight:  [51.483 kg (113 lb 8 oz)] 51.483 kg (113 lb 8 oz) (05/25 1212)  General Appearance: Alert, cooperative, no distress, appears stated age Head: Normocephalic, without obvious abnormality, atraumatic Eyes: PERRL, conjunctiva/corneas clear, EOM's intact      Neck: Supple, symmetrical, trachea midline, Back: Symmetric, no curvature, ROM normal, no CVA tenderness Lungs:  respirations unlabored Heart: Regular rate and rhythm Abdomen: Soft, non-tender Extremities: Extremities normal, atraumatic, no cyanosis or edema Pulses: 2+ and symmetric all extremities Skin: Skin color, texture, turgor normal, no rashes or  lesions  NEUROLOGIC:  Mental status: Alert and oriented x4, no aphasia, good attention span, fund of knowledge and memory  Motor Exam - grossly normal Sensory Exam - grossly normal Reflexes: 1+ Coordination - grossly normal Gait - grossly normal Balance - grossly normal Cranial Nerves: I: smell Not tested  II: visual acuity  OS: nl    OD: nl  II: visual fields Full to confrontation  II: pupils Equal, round, reactive to light  III,VII: ptosis None  III,IV,VI: extraocular muscles  Full ROM  V: mastication Normal  V: facial light touch sensation  Normal  V,VII: corneal reflex  Present  VII: facial muscle function - upper  Normal  VII: facial muscle function - lower Normal  VIII: hearing Not tested  IX: soft palate elevation  Normal  IX,X: gag reflex Present  XI: trapezius strength  5/5  XI: sternocleidomastoid strength 5/5  XI: neck flexion strength  5/5  XII: tongue strength  Normal    Data Review Lab Results  Component Value Date   WBC 7.0 04/17/2016   HGB 13.7 04/17/2016   HCT 39.2 04/17/2016   MCV 92.2 04/17/2016   PLT 232 04/17/2016   Lab Results  Component Value Date   NA 136 04/17/2016   K 3.6 04/17/2016   CL 104 04/17/2016   CO2 24 04/17/2016   BUN 17 04/17/2016   CREATININE 0.73 04/17/2016   GLUCOSE 94 04/17/2016   Lab Results  Component Value Date   INR 0.96 04/17/2016    Assessment:   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 Patient has failed conservative therapy. Planned surgery : C6-7 TDR  Plan:   I explained the condition and procedure to the patient and answered any questions.  Patient wishes to proceed with procedure as planned. Understands risks/ benefits/ and expected or typical outcomes.  Loi Rennaker S 04/20/2016 1:07 PM

## 2016-04-20 NOTE — Anesthesia Procedure Notes (Signed)
Procedure Name: Intubation Date/Time: 04/20/2016 1:24 PM Performed by: Fabian NovemberSOLHEIM, Mckenna Gamm SALOMAN Pre-anesthesia Checklist: Patient identified, Patient being monitored, Timeout performed, Emergency Drugs available and Suction available Patient Re-evaluated:Patient Re-evaluated prior to inductionOxygen Delivery Method: Circle System Utilized Preoxygenation: Pre-oxygenation with 100% oxygen Intubation Type: IV induction Ventilation: Mask ventilation without difficulty Laryngoscope Size: Miller and 3 Grade View: Grade I Tube type: Oral Tube size: 7.5 mm Number of attempts: 1 Airway Equipment and Method: Stylet Placement Confirmation: ETT inserted through vocal cords under direct vision,  positive ETCO2 and breath sounds checked- equal and bilateral Secured at: 20 cm Tube secured with: Tape Dental Injury: Teeth and Oropharynx as per pre-operative assessment

## 2016-04-20 NOTE — Op Note (Signed)
04/20/2016  2:50 PM  PATIENT:  Kelly Wall  55 y.o. 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 She had multilevel cervical spondylosis and foraminal stenosis at C6-7 on the right with right arm pain and numbness and weakness. I recommended decompressive surgery with total disc arthroplasty. Patient understood the risks, benefits, and alternatives and potential outcomes and wished to proceed.  PROCEDURE DETAILS: Patient was brought to the operating room placed under general endotracheal anesthesia. Patient was placed in the supine position on the operating room table. The neck was prepped with Duraprep and draped in a sterile fashion.   Three cc of local anesthesia was injected and a transverse incision was made on the right side of the neck.  Dissection was carried down thru the subcutaneous tissue and the platysma was  elevated, opened, and undermined with Metzenbaum scissors.  Dissection was then carried out thru an avascular plane leaving the sternocleidomastoid carotid artery and jugular vein laterally and the trachea and esophagus medially. The ventral aspect of the vertebral column was identified and a localizing x-ray was taken. The C6-7 level was identified. The longus colli muscles were then elevated and the retractor was placed. The annulus was incised and the disc space entered. Discectomy was performed with micro-curettes and pituitary rongeurs. I then used the high-speed  drill to drill the endplates down to the level of the posterior longitudinal ligament. The operating microscope was draped and brought into the field provided additional magnification, illumination and visualization. Discectomy was continued posteriorly thru the disc space. Posterior longitudinal ligament was opened with a nerve hook, and then removed along with disc herniation and osteophytes, decompressing the spinal canal and thecal sac. We then continued to remove osteophytic overgrowth and disc material decompressing the neural foramina and exiting nerve roots bilaterally. The scope was angled up and down to help decompress and undercut the vertebral bodies. Once the decompression was completed we could pass a nerve hook circumferentially to assure adequate decompression in the midline and in the neural foramina. So by both visualization and palpation we felt we had an adequate decompression of the neural elements. I then used sequential trials. The 6 x 16 mm trial fit the best. I then placed my drill guide and drilled -4 small holes. I then used a cutting chisel to prepare for placement of the disc arthroplasty. This was done under lateral fluoroscopy. The arthroplasty device was then tapped into position at C6-7 and its final construct checked with AP and lateral fluoroscopy. The wound was irrigated with bacitracin solution, checked for hemostasis which was established and confirmed. Once meticulous hemostasis was achieved, we then proceeded with closure. The platysma was closed with interrupted 3-0 undyed Vicryl suture, the subcuticular layer was closed with interrupted 3-0 undyed Vicryl suture. The skin edges were approximated with steristrips. The drapes were removed. A sterile dressing was applied. The patient was then awakened from general anesthesia and transferred to the recovery room in stable condition. At the end of the procedure all sponge, needle  and instrument counts were correct.   PLAN OF  CARE: Admit for overnight observation  PATIENT DISPOSITION:  PACU - hemodynamically stable.   Delay start of Pharmacological VTE agent (>24hrs) due to surgical blood loss or risk of bleeding:  yes

## 2016-04-21 ENCOUNTER — Encounter (HOSPITAL_COMMUNITY): Payer: Self-pay | Admitting: Neurological Surgery

## 2016-04-21 DIAGNOSIS — M50223 Other cervical disc displacement at C6-C7 level: Secondary | ICD-10-CM | POA: Diagnosis not present

## 2016-04-21 DIAGNOSIS — I10 Essential (primary) hypertension: Secondary | ICD-10-CM | POA: Diagnosis not present

## 2016-04-21 DIAGNOSIS — M1990 Unspecified osteoarthritis, unspecified site: Secondary | ICD-10-CM | POA: Diagnosis not present

## 2016-04-21 MED ORDER — HYDROCODONE-ACETAMINOPHEN 5-325 MG PO TABS: 1.0000 | ORAL_TABLET | ORAL | Status: DC | PRN

## 2016-04-21 NOTE — Progress Notes (Signed)
Patient alert and oriented, mae's well, voiding adequate amount of urine, swallowing without difficulty, no c/o pain. Patient discharged home with family. Script and discharged instructions given to patient. Patient and family stated understanding of d/c instructions given and has an appointment with MD. 

## 2016-04-21 NOTE — Discharge Summary (Signed)
Physician Discharge Summary  Patient ID: Kelly Wall MRN: [id number] DOB/AGE: [birth year] 55 y.o.  Admit date: 04/20/2016 Discharge date: 04/21/2016  Admission Diagnoses: hnp C6-7    Discharge Diagnoses: same   Discharged Condition: good  Hospital Course: The patient was admitted on 04/20/2016 and taken to the operating room where the patient underwent C6-7 TDR. The patient tolerated the procedure well and was taken to the recovery room and then to the floor in stable condition. The hospital course was routine. There were no complications. The wound remained clean dry and intact. Pt had appropriate neck soreness. No complaints of arm pain or new N/T/W. The patient remained afebrile with stable vital signs, and tolerated a regular diet. The patient continued to increase activities, and pain was well controlled with oral pain medications.   Consults: None  Significant Diagnostic Studies:  Results for orders placed or performed during the hospital encounter of 04/17/16  Surgical pcr screen  Result Value Ref Range   MRSA, PCR NEGATIVE NEGATIVE   Staphylococcus aureus NEGATIVE NEGATIVE  Basic metabolic panel  Result Value Ref Range   Sodium 136 135 - 145 mmol/L   Potassium 3.6 3.5 - 5.1 mmol/L   Chloride 104 101 - 111 mmol/L   CO2 24 22 - 32 mmol/L   Glucose, Bld 94 65 - 99 mg/dL   BUN 17 6 - 20 mg/dL   Creatinine, Ser 4.09 0.44 - 1.00 mg/dL   Calcium 9.2 8.9 - 81.1 mg/dL   GFR calc non Af Amer >60 >60 mL/min   GFR calc Af Amer >60 >60 mL/min   Anion gap 8 5 - 15  CBC WITH DIFFERENTIAL  Result Value Ref Range   WBC 7.0 4.0 - 10.5 K/uL   RBC 4.25 3.87 - 5.11 MIL/uL   Hemoglobin 13.7 12.0 - 15.0 g/dL   HCT 91.4 78.2 - 95.6 %   MCV 92.2 78.0 - 100.0 fL   MCH 32.2 26.0 - 34.0 pg   MCHC 34.9 30.0 - 36.0 g/dL   RDW 21.3 08.6 - 57.8 %   Platelets 232 150 - 400 K/uL   Neutrophils Relative % 69 %   Neutro Abs 4.8 1.7 - 7.7 K/uL   Lymphocytes Relative 24 %   Lymphs Abs 1.7  0.7 - 4.0 K/uL   Monocytes Relative 6 %   Monocytes Absolute 0.4 0.1 - 1.0 K/uL   Eosinophils Relative 1 %   Eosinophils Absolute 0.1 0.0 - 0.7 K/uL   Basophils Relative 0 %   Basophils Absolute 0.0 0.0 - 0.1 K/uL  Protime-INR  Result Value Ref Range   Prothrombin Time 13.0 11.6 - 15.2 seconds   INR 0.96 0.00 - 1.49    Chest 2 View  04/17/2016  CLINICAL DATA:  Preoperative evaluation for upcoming cervical spine surgery EXAM: CHEST  2 VIEW COMPARISON:  10/09/2013 FINDINGS: The heart size and mediastinal contours are within normal limits. Both lungs are clear. The visualized skeletal structures are unremarkable. IMPRESSION: No active cardiopulmonary disease. Electronically Signed   By: Alcide Clever M.D.   On: 04/17/2016 09:31   Dg Cervical Spine 2 Or 3 Views  04/20/2016  CLINICAL DATA:  C6-C7 disc replacement. EXAM: CERVICAL SPINE - 2-3 VIEW; DG C-ARM 61-120 MIN COMPARISON:  MRI of the cervical spine 03/24/2016 FINDINGS: Intraoperative fluoroscopic images from C6-C7 discs replacement were presented for interpretation. Fluoroscopy time is recorded as 20 seconds. There is no evidence of cervical spine fracture. Alignment is normal. C6-C7 disc prosthesis is in  place and in good alignment. The patient is intubated. IMPRESSION: Status post C6-C7 discs replacement with good alignment of the cervical spine. Electronically Signed   By: Ted Mcalpine M.D.   On: 04/20/2016 15:22   Mr Cervical Spine Wo Contrast  03/25/2016  CLINICAL DATA:  Neck pain, tingling radiating down the right arm EXAM: MRI CERVICAL SPINE WITHOUT CONTRAST TECHNIQUE: Multiplanar, multisequence MR imaging of the cervical spine was performed. No intravenous contrast was administered. COMPARISON:  None. FINDINGS: Alignment:  Normal cervical lordosis. No static listhesis. Bones: Vertebral body heights are maintained. Bone marrow signal is normal. Spinal cord:  Cervical cord is normal in size and signal. Posterior fossa: No focal  abnormality. Vessels:  Normal flow voids are present. Paraspinal tissues:  No focal abnormality. Disc levels: Discs: Disc spaces are relatively well maintained. C2-3: No significant disc bulge. No neural foraminal stenosis. No central canal stenosis. C3-4: Shallow broad right paracentral disc protrusion. No neural foraminal stenosis. No central canal stenosis. C4-5: Shallow central disc protrusion abutting the ventral cervical spinal cord. No neural foraminal stenosis. Mild central canal stenosis. C5-6: Broad central disc protrusion. No neural foraminal stenosis. Mild central canal stenosis. C6-7: Mild broad-based disc bulge. Mild bilateral uncovertebral degenerative changes with severe right foraminal stenosis. Mild left foraminal stenosis. Mild spinal stenosis. C7-T1: No significant disc bulge. No neural foraminal stenosis. No central canal stenosis. T2-3:  Small central disc protrusion.  No foraminal stenosis. IMPRESSION: 1. At C6-7 there is a mild broad-based disc bulge. Mild bilateral uncovertebral degenerative changes with severe right foraminal stenosis. Mild left foraminal stenosis. Mild spinal stenosis. 2. At C4-5 there is a shallow central disc protrusion abutting the ventral cervical spinal cord. Mild central canal stenosis. 3. At C3-4 there is a shallow broad right paracentral disc protrusion. Electronically Signed   By: Elige Ko   On: 03/25/2016 09:22   Dg C-arm 1-60 Min  04/20/2016  CLINICAL DATA:  C6-C7 disc replacement. EXAM: CERVICAL SPINE - 2-3 VIEW; DG C-ARM 61-120 MIN COMPARISON:  MRI of the cervical spine 03/24/2016 FINDINGS: Intraoperative fluoroscopic images from C6-C7 discs replacement were presented for interpretation. Fluoroscopy time is recorded as 20 seconds. There is no evidence of cervical spine fracture. Alignment is normal. C6-C7 disc prosthesis is in place and in good alignment. The patient is intubated. IMPRESSION: Status post C6-C7 discs replacement with good alignment of  the cervical spine. Electronically Signed   By: Ted Mcalpine M.D.   On: 04/20/2016 15:22    Antibiotics:  Anti-infectives    Start     Dose/Rate Route Frequency Ordered Stop   04/20/16 2100  ceFAZolin (ANCEF) IVPB 1 g/50 mL premix     1 g 100 mL/hr over 30 Minutes Intravenous Every 8 hours 04/20/16 1757 04/21/16 0435   04/20/16 1409  bacitracin 50,000 Units in sodium chloride irrigation 0.9 % 500 mL irrigation  Status:  Discontinued       As needed 04/20/16 1409 04/20/16 1445   04/20/16 0600  ceFAZolin (ANCEF) IVPB 2g/100 mL premix     2 g 200 mL/hr over 30 Minutes Intravenous On call to O.R. 04/19/16 1324 04/20/16 1330      Discharge Exam: Blood pressure 98/47, pulse 63, temperature 98.2 F (36.8 C), temperature source Oral, resp. rate 18, height  (1.549 m), weight 51.483 kg (113 lb 8 oz), SpO2 100 %. Neurologic: Grossly normal Dressing dry  Discharge Medications:     Medication List    TAKE these medications  acetaminophen 500 MG tablet  Commonly known as:  TYLENOL  Take 500 mg by mouth at bedtime as needed (for throbbing pain).     Biotin 5000 MCG Caps  Take 15,000 mcg by mouth at bedtime.     clindamycin 1 % lotion  Commonly known as:  CLEOCIN T  Apply 1 application topically every morning.     EQL VITAMIN D3 1000 units tablet  Generic drug:  Cholecalciferol  Take 5,000 Units by mouth daily.     estrogen (conjugated)-medroxyprogesterone 0.625-2.5 MG tablet  Commonly known as:  PREMPRO  Take 1 tablet by mouth at bedtime as needed.     Fish Oil 1000 MG Caps  Take 4,000 mg by mouth daily.     HYDROcodone-acetaminophen 5-325 MG tablet  Commonly known as:  NORCO/VICODIN  Take 1 tablet by mouth every 4 (four) hours as needed (mild pain).     meloxicam 15 MG tablet  Commonly known as:  MOBIC  Take one tablet a day for 7 days, then take as needed. Take with food.     RETIN-A 0.025 % cream  Generic drug:  tretinoin  Apply 1 application  topically at bedtime.     VITAMIN B-12 PO  Take 1,000 mcg by mouth daily. Gummies     vitamin C 250 MG tablet  Commonly known as:  ASCORBIC ACID  Take 500 mg by mouth daily.        Disposition: home   Final Dx: C6-7 TDR      Discharge Instructions    Call MD for:  persistant nausea and vomiting    Complete by:  As directed      Call MD for:  redness, tenderness, or signs of infection (pain, swelling, redness, odor or green/yellow discharge around incision site)    Complete by:  As directed      Call MD for:  severe uncontrolled pain    Complete by:  As directed      Call MD for:  temperature >100.4    Complete by:  As directed      Diet - low sodium heart healthy    Complete by:  As directed      Discharge instructions    Complete by:  As directed   No strenuous activity, no heavy lifting     Increase activity slowly    Complete by:  As directed      Remove dressing in 48 hours    Complete by:  As directed            Follow-up Information    Follow up with Bryanna Yim S, MD. Schedule an appointment as soon as possible for a visit in 2 weeks.   Specialty:  Neurosurgery   Contact information:   [address] [place] [address] [phone]        Signed: Tia AlertJONES,Dayana Dalporto S 04/21/2016, 7:30 AM

## 2016-04-25 ENCOUNTER — Encounter (HOSPITAL_COMMUNITY): Payer: Self-pay | Admitting: Neurological Surgery

## 2016-04-25 NOTE — Anesthesia Postprocedure Evaluation (Signed)
Anesthesia Post Note  Patient: Kelly Wall  Procedure(s) Performed: Procedure(s) (LRB): Cervical Six-Seven Artificial Disc Replacement-Cervical (N/A)  Patient location during evaluation: PACU Anesthesia Type: General Level of consciousness: awake and alert Pain management: pain level controlled Vital Signs Assessment: post-procedure vital signs reviewed and stable Respiratory status: spontaneous breathing, nonlabored ventilation, respiratory function stable and patient connected to nasal cannula oxygen Cardiovascular status: blood pressure returned to baseline and stable Postop Assessment: no signs of nausea or vomiting Anesthetic complications: no     Last Vitals:  Filed Vitals:   04/21/16 0532 04/21/16 0732  BP: 98/47 112/50  Pulse: 63 66  Temp: 36.8 C 36.4 C  Resp: 18 18    Last Pain:  Filed Vitals:   04/21/16 0740  PainSc: Asleep   Pain Goal: Patients Stated Pain Goal: 2 (04/21/16 0014)               Phillips Groutarignan, Mikah Poss

## 2016-06-01 MED FILL — CLINDAMYCIN-BENZOYL PEROX 1: 1-5 | 30 days supply | Qty: 25 | Fill #3

## 2016-06-01 MED FILL — DROSPIR-ETH ESTRA 3/.02 MG: 3-0.02 | 28 days supply | Qty: 28 | Fill #1

## 2016-06-02 DIAGNOSIS — M5412 Radiculopathy, cervical region: Secondary | ICD-10-CM | POA: Diagnosis not present

## 2016-06-27 MED FILL — TRETINOIN 0.025% CREAM: 0.025 | 90 days supply | Qty: 45 | Fill #2

## 2016-06-27 MED FILL — DROSPIR-ETH ESTRA 3/.02 MG: 3-0.02 | 84 days supply | Qty: 84 | Fill #2

## 2016-09-19 MED FILL — GIANVI 3-0.02 MG TABS: 3-0.02 | 84 days supply | Qty: 84 | Fill #3

## 2016-09-19 MED FILL — CLINDAMYCIN-BENZOYL PEROX 1: 1-5 | 30 days supply | Qty: 25 | Fill #4

## 2016-09-19 MED FILL — TRETINOIN 0.025% CREAM: 0.025 | 90 days supply | Qty: 45 | Fill #3

## 2016-09-29 DIAGNOSIS — Z760 Encounter for issue of repeat prescription: Secondary | ICD-10-CM | POA: Diagnosis not present

## 2016-12-08 DIAGNOSIS — H5211 Myopia, right eye: Secondary | ICD-10-CM | POA: Diagnosis not present

## 2016-12-08 DIAGNOSIS — H5202 Hypermetropia, left eye: Secondary | ICD-10-CM | POA: Diagnosis not present

## 2016-12-14 MED FILL — GIANVI 3-0.02 MG TABS: 3-0.02 | 84 days supply | Qty: 84 | Fill #4

## 2016-12-15 MED FILL — CLINDAMYCIN-BENZOYL PEROX 1: 1-5 | 65 days supply | Qty: 50 | Fill #0

## 2016-12-15 MED FILL — TRETINOIN 0.025% CREAM: 0.025 | 90 days supply | Qty: 45 | Fill #0

## 2017-03-23 MED FILL — LORYNA 3-0.02 MG TAB: 3-0.02 | 28 days supply | Qty: 28 | Fill #5

## 2017-04-19 MED FILL — LORYNA 3-0.02 MG TAB: 3-0.02 | 28 days supply | Qty: 28 | Fill #0

## 2017-04-19 MED FILL — CLINDAMYCIN-BENZOYL PEROX 1: 1-5 | 65 days supply | Qty: 50 | Fill #1

## 2017-05-01 DIAGNOSIS — Z1231 Encounter for screening mammogram for malignant neoplasm of breast: Secondary | ICD-10-CM | POA: Diagnosis not present

## 2017-05-01 DIAGNOSIS — N951 Menopausal and female climacteric states: Secondary | ICD-10-CM | POA: Diagnosis not present

## 2017-05-01 DIAGNOSIS — Z124 Encounter for screening for malignant neoplasm of cervix: Secondary | ICD-10-CM | POA: Diagnosis not present

## 2017-05-01 DIAGNOSIS — Z6821 Body mass index (BMI) 21.0-21.9, adult: Secondary | ICD-10-CM | POA: Diagnosis not present

## 2017-05-01 DIAGNOSIS — Z01411 Encounter for gynecological examination (general) (routine) with abnormal findings: Secondary | ICD-10-CM | POA: Diagnosis not present

## 2017-05-01 DIAGNOSIS — N762 Acute vulvitis: Secondary | ICD-10-CM | POA: Diagnosis not present

## 2017-05-01 MED FILL — NYSTATIN-TRIAMCINOLONE CRM: 100000-0.1 | 30 days supply | Qty: 60 | Fill #0

## 2017-05-21 MED FILL — LORYNA 3-0.02 MG TAB: 3-0.02 | 84 days supply | Qty: 84 | Fill #0

## 2017-05-24 MED FILL — TRETINOIN 0.025% CREAM: 0.025 | 90 days supply | Qty: 45 | Fill #1

## 2017-07-12 IMAGING — CR DG CHEST 2V
2 series · 2 of 2 positions shown · non-contrast
Comparison: 10/09/2013

CLINICAL DATA: Preoperative evaluation for upcoming cervical spine
surgery

EXAM:
CHEST  2 VIEW

[w chest pa]
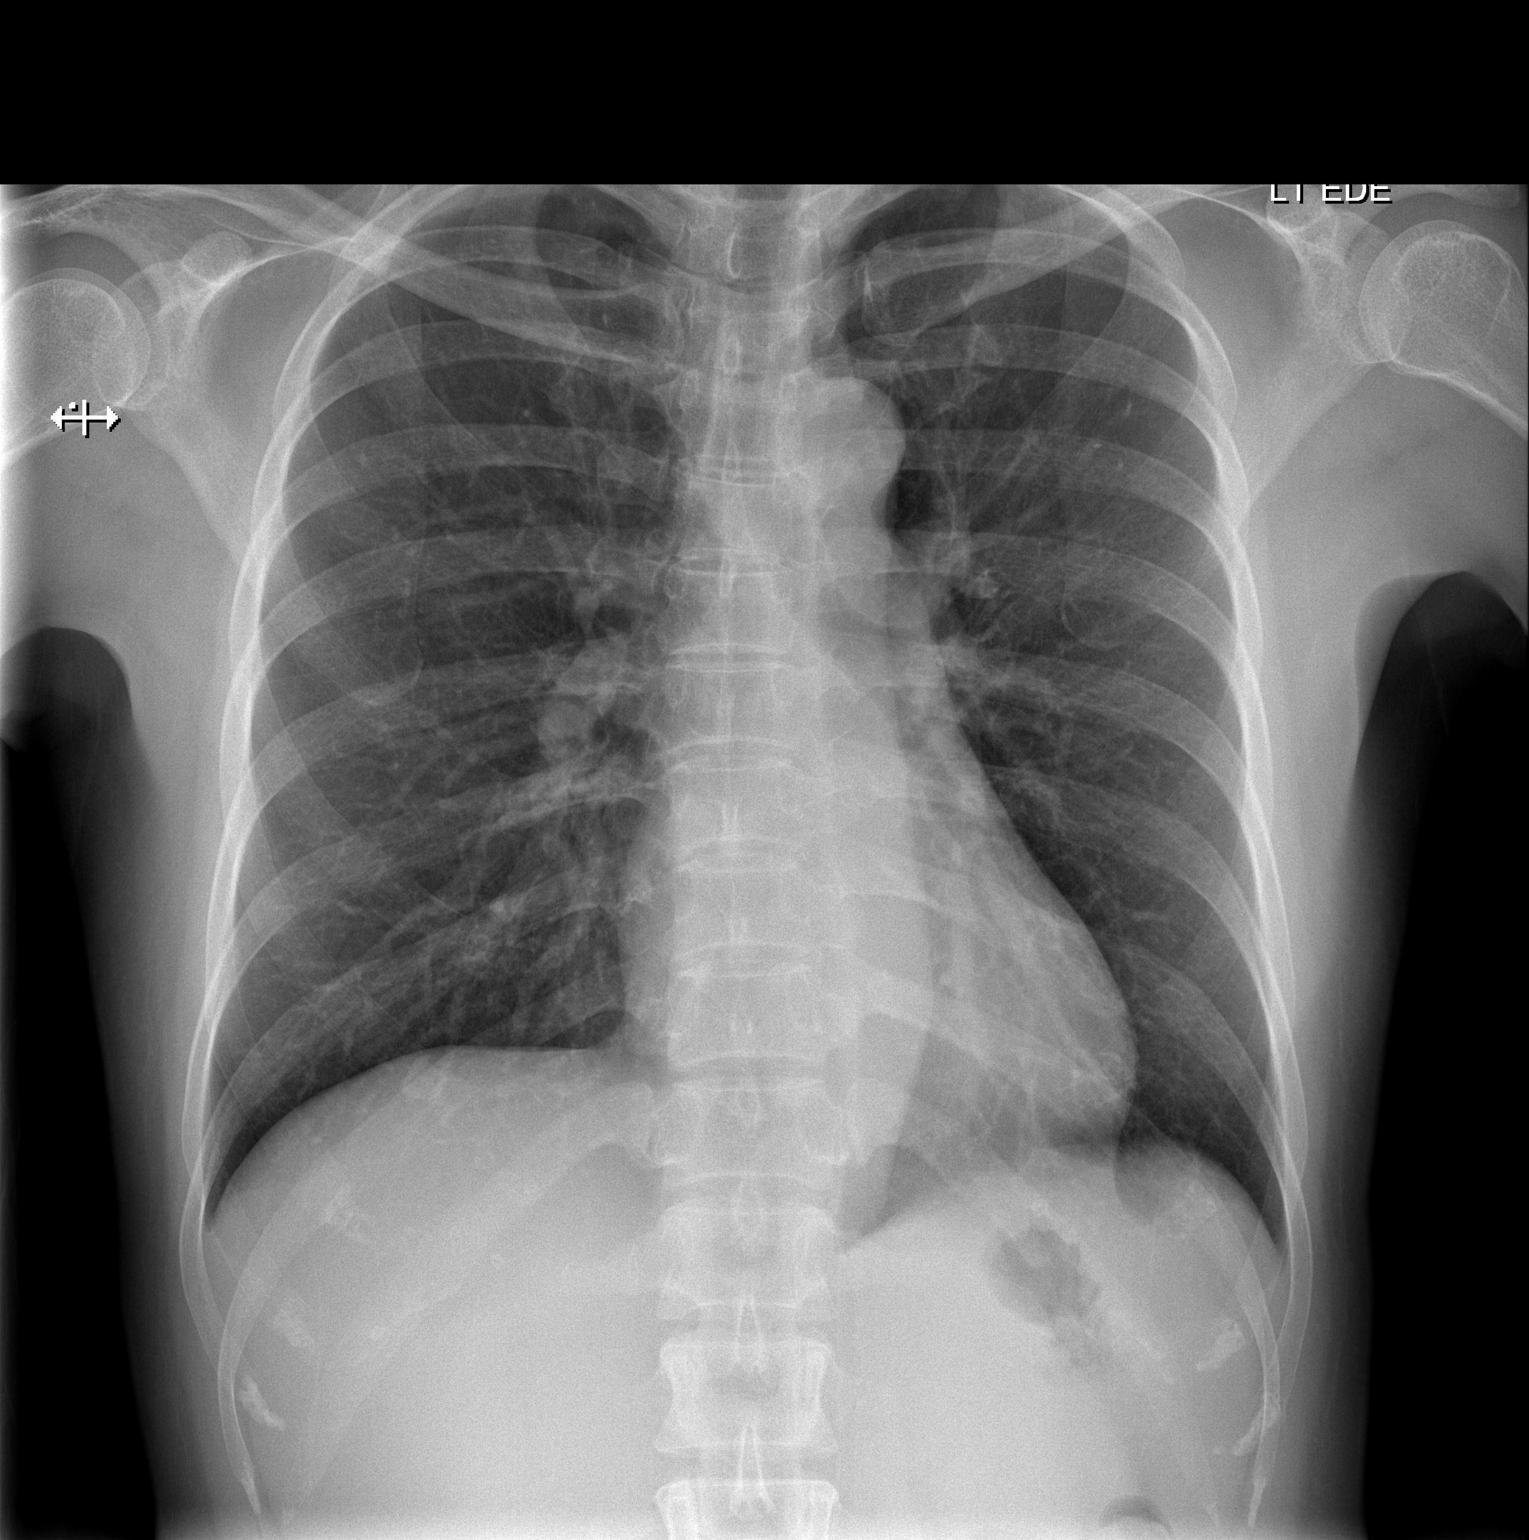

[w chest lat]
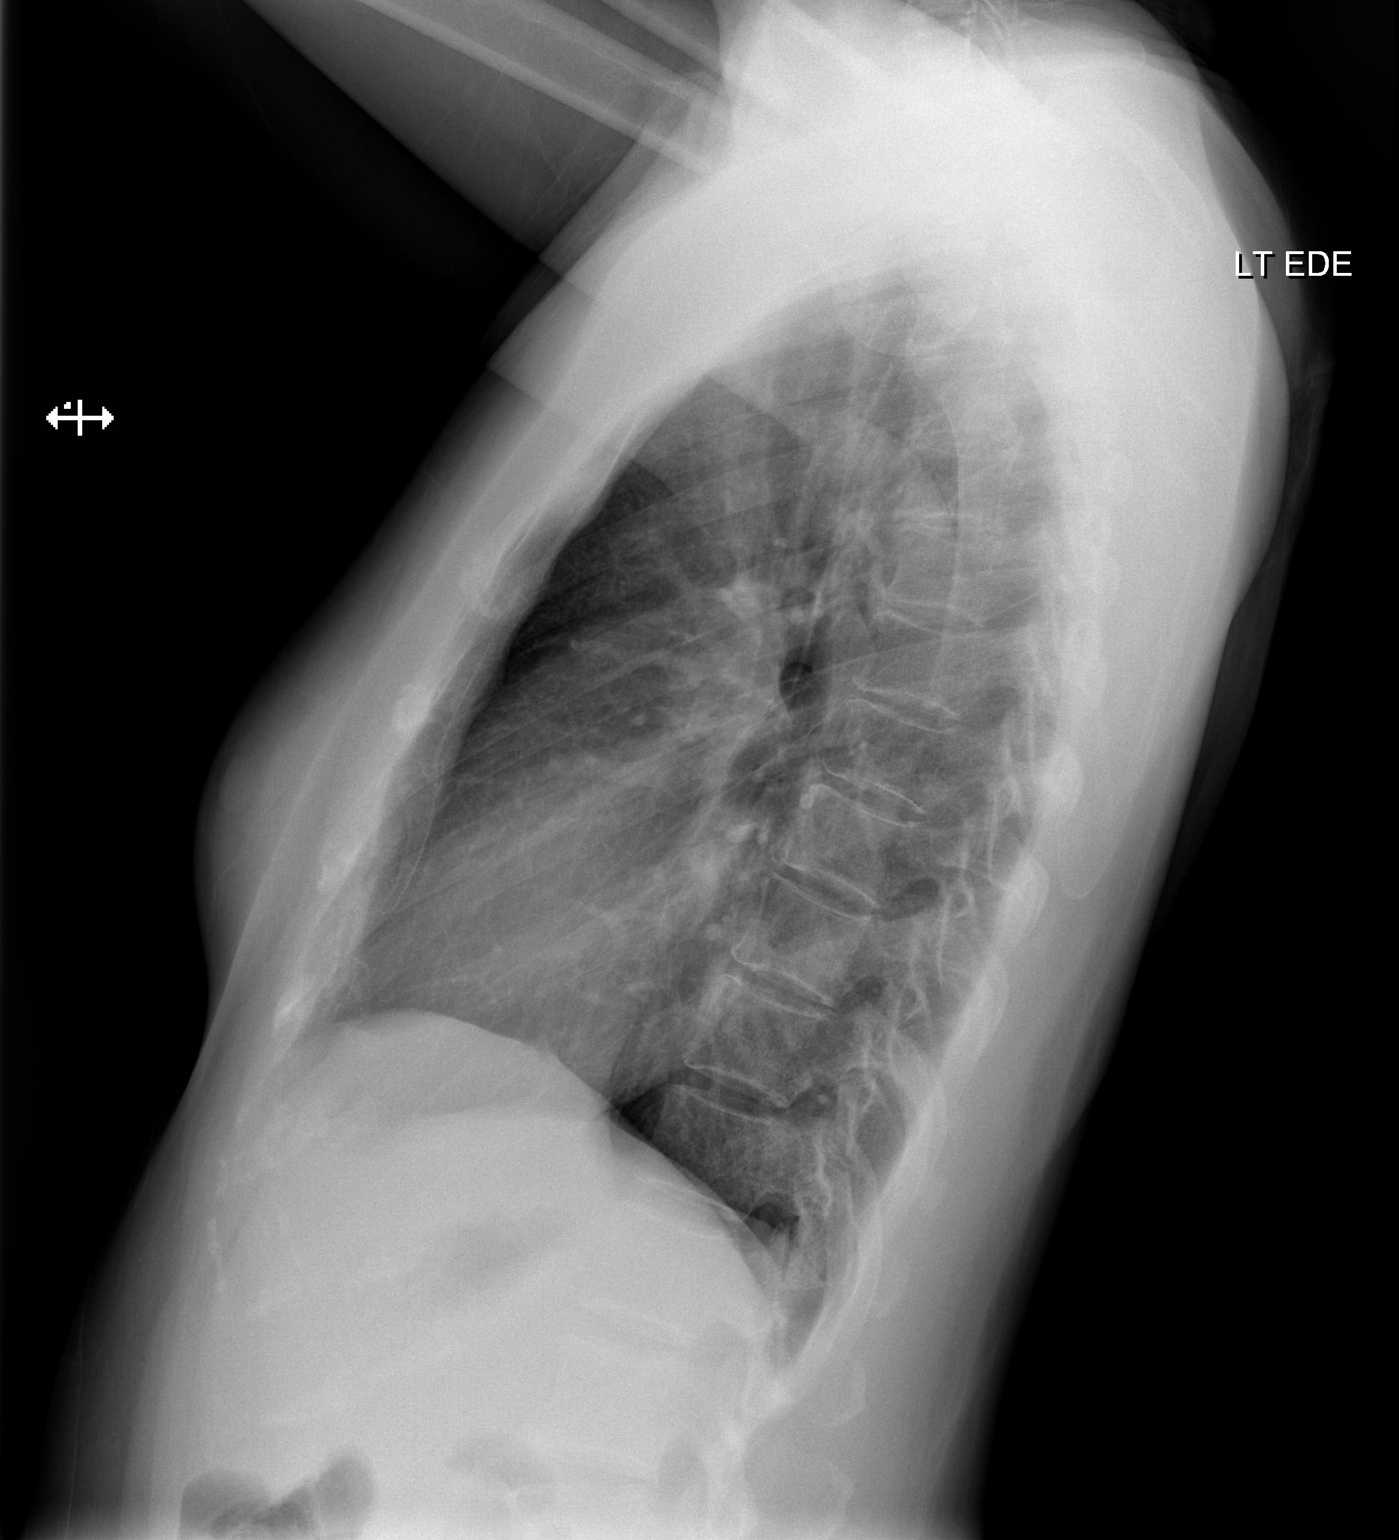

[2 of 2 positions shown; findings below may reference images not displayed]

FINDINGS: The heart size and mediastinal contours are within normal limits.
Both lungs are clear. The visualized skeletal structures are
unremarkable.
IMPRESSION: No active cardiopulmonary disease.

## 2017-08-24 MED FILL — CLINDAMYCIN-BENZOYL PEROX 1: 1-5 | 65 days supply | Qty: 50 | Fill #2

## 2017-08-24 MED FILL — DROSPIR-ETH ESTRA 3/.02 MG: 3-0.02 | 84 days supply | Qty: 84 | Fill #1

## 2017-10-11 MED FILL — TRETINOIN 0.025% CREAM: 0.025 | 90 days supply | Qty: 45 | Fill #2

## 2017-11-15 MED FILL — DROSPIR-ETH ESTRA 3/.02 MG: 3-0.02 | 84 days supply | Qty: 84 | Fill #2

## 2017-12-14 DIAGNOSIS — H11002 Unspecified pterygium of left eye: Secondary | ICD-10-CM | POA: Diagnosis not present

## 2018-01-16 MED FILL — TRETINOIN 0.025% CREAM: 0.025 | 90 days supply | Qty: 45 | Fill #0

## 2018-01-16 MED FILL — CLINDAMYCIN-BENZOYL PEROX 1: 1-5 | 30 days supply | Qty: 25 | Fill #0

## 2018-02-08 MED FILL — DROSPIR-ETH ESTRA 3/.02 MG: 3-0.02 | 84 days supply | Qty: 84 | Fill #3

## 2018-03-29 ENCOUNTER — Inpatient Hospital Stay (HOSPITAL_COMMUNITY)
Admission: EM | Admit: 2018-03-29 | Discharge: 2018-04-02 | DRG: 030 | Disposition: A | Discharge status: Rehabilitation Facility | Payer: 59 | Attending: Neurosurgery | Admitting: Neurosurgery

## 2018-03-29 ENCOUNTER — Emergency Department (HOSPITAL_COMMUNITY): Payer: 59

## 2018-03-29 ENCOUNTER — Encounter (HOSPITAL_COMMUNITY): Payer: Self-pay

## 2018-03-29 DIAGNOSIS — Y9355 Activity, bike riding: Secondary | ICD-10-CM

## 2018-03-29 DIAGNOSIS — T380X5A Adverse effect of glucocorticoids and synthetic analogues, initial encounter: Secondary | ICD-10-CM

## 2018-03-29 DIAGNOSIS — S14154A Other incomplete lesion at C4 level of cervical spinal cord, initial encounter: Principal | ICD-10-CM | POA: Diagnosis present

## 2018-03-29 DIAGNOSIS — S0993XA Unspecified injury of face, initial encounter: Secondary | ICD-10-CM | POA: Diagnosis not present

## 2018-03-29 DIAGNOSIS — I10 Essential (primary) hypertension: Secondary | ICD-10-CM

## 2018-03-29 DIAGNOSIS — G8918 Other acute postprocedural pain: Secondary | ICD-10-CM

## 2018-03-29 DIAGNOSIS — M79602 Pain in left arm: Secondary | ICD-10-CM | POA: Diagnosis not present

## 2018-03-29 DIAGNOSIS — Z885 Allergy status to narcotic agent status: Secondary | ICD-10-CM | POA: Diagnosis not present

## 2018-03-29 DIAGNOSIS — S59902A Unspecified injury of left elbow, initial encounter: Secondary | ICD-10-CM | POA: Diagnosis not present

## 2018-03-29 DIAGNOSIS — S6992XA Unspecified injury of left wrist, hand and finger(s), initial encounter: Secondary | ICD-10-CM | POA: Diagnosis not present

## 2018-03-29 DIAGNOSIS — Z419 Encounter for procedure for purposes other than remedying health state, unspecified: Secondary | ICD-10-CM

## 2018-03-29 DIAGNOSIS — J45909 Unspecified asthma, uncomplicated: Secondary | ICD-10-CM

## 2018-03-29 DIAGNOSIS — R739 Hyperglycemia, unspecified: Secondary | ICD-10-CM

## 2018-03-29 DIAGNOSIS — M79601 Pain in right arm: Secondary | ICD-10-CM

## 2018-03-29 DIAGNOSIS — S59901A Unspecified injury of right elbow, initial encounter: Secondary | ICD-10-CM | POA: Diagnosis not present

## 2018-03-29 DIAGNOSIS — S6991XA Unspecified injury of right wrist, hand and finger(s), initial encounter: Secondary | ICD-10-CM | POA: Diagnosis not present

## 2018-03-29 DIAGNOSIS — M4802 Spinal stenosis, cervical region: Secondary | ICD-10-CM | POA: Diagnosis present

## 2018-03-29 DIAGNOSIS — S3993XA Unspecified injury of pelvis, initial encounter: Secondary | ICD-10-CM | POA: Diagnosis not present

## 2018-03-29 DIAGNOSIS — S59912A Unspecified injury of left forearm, initial encounter: Secondary | ICD-10-CM | POA: Diagnosis not present

## 2018-03-29 DIAGNOSIS — R001 Bradycardia, unspecified: Secondary | ICD-10-CM

## 2018-03-29 DIAGNOSIS — M6281 Muscle weakness (generalized): Secondary | ICD-10-CM | POA: Diagnosis not present

## 2018-03-29 DIAGNOSIS — S12301A Unspecified nondisplaced fracture of fourth cervical vertebra, initial encounter for closed fracture: Secondary | ICD-10-CM | POA: Diagnosis not present

## 2018-03-29 DIAGNOSIS — R29898 Other symptoms and signs involving the musculoskeletal system: Secondary | ICD-10-CM

## 2018-03-29 DIAGNOSIS — R200 Anesthesia of skin: Secondary | ICD-10-CM

## 2018-03-29 DIAGNOSIS — Z888 Allergy status to other drugs, medicaments and biological substances status: Secondary | ICD-10-CM

## 2018-03-29 DIAGNOSIS — S0990XA Unspecified injury of head, initial encounter: Secondary | ICD-10-CM | POA: Diagnosis not present

## 2018-03-29 DIAGNOSIS — S199XXA Unspecified injury of neck, initial encounter: Secondary | ICD-10-CM | POA: Diagnosis not present

## 2018-03-29 DIAGNOSIS — S59911A Unspecified injury of right forearm, initial encounter: Secondary | ICD-10-CM | POA: Diagnosis not present

## 2018-03-29 DIAGNOSIS — S299XXA Unspecified injury of thorax, initial encounter: Secondary | ICD-10-CM | POA: Diagnosis not present

## 2018-03-29 DIAGNOSIS — M542 Cervicalgia: Secondary | ICD-10-CM | POA: Diagnosis not present

## 2018-03-29 DIAGNOSIS — T1490XA Injury, unspecified, initial encounter: Secondary | ICD-10-CM

## 2018-03-29 DIAGNOSIS — S14125A 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 of cervical spinal cord, initial encounter: Secondary | ICD-10-CM | POA: Diagnosis present

## 2018-03-29 DIAGNOSIS — R202 Paresthesia of skin: Secondary | ICD-10-CM

## 2018-03-29 LAB — I-STAT CHEM 8, ED
BUN: 16 mg/dL (ref 6–20)
Calcium, Ion: 1.1 mmol/L — ABNORMAL LOW (ref 1.15–1.40)
Chloride: 103 mmol/L (ref 101–111)
Creatinine, Ser: 0.6 mg/dL (ref 0.44–1.00)
Glucose, Bld: 137 mg/dL — ABNORMAL HIGH (ref 65–99)
HCT: 36 % (ref 36.0–46.0)
Hemoglobin: 12.2 g/dL (ref 12.0–15.0)
Potassium: 3.7 mmol/L (ref 3.5–5.1)
Sodium: 138 mmol/L (ref 135–145)
TCO2: 21 mmol/L — ABNORMAL LOW (ref 22–32)

## 2018-03-29 LAB — CBC
HCT: 37.3 % (ref 36.0–46.0)
Hemoglobin: 12.6 g/dL (ref 12.0–15.0)
MCH: 32.1 pg (ref 26.0–34.0)
MCHC: 33.8 g/dL (ref 30.0–36.0)
MCV: 95.2 fL (ref 78.0–100.0)
Platelets: 238 10*3/uL (ref 150–400)
RBC: 3.92 MIL/uL (ref 3.87–5.11)
RDW: 12.3 % (ref 11.5–15.5)
WBC: 9.3 10*3/uL (ref 4.0–10.5)

## 2018-03-29 LAB — COMPREHENSIVE METABOLIC PANEL
ALT: 19 U/L (ref 14–54)
AST: 29 U/L (ref 15–41)
Albumin: 3.3 g/dL — ABNORMAL LOW (ref 3.5–5.0)
Alkaline Phosphatase: 25 U/L — ABNORMAL LOW (ref 38–126)
Anion gap: 11 (ref 5–15)
BUN: 16 mg/dL (ref 6–20)
CO2: 20 mmol/L — ABNORMAL LOW (ref 22–32)
Calcium: 8.6 mg/dL — ABNORMAL LOW (ref 8.9–10.3)
Chloride: 104 mmol/L (ref 101–111)
Creatinine, Ser: 0.73 mg/dL (ref 0.44–1.00)
GFR calc Af Amer: >60 mL/min (ref >60)
GFR calc non Af Amer: >60 mL/min (ref >60)
Glucose, Bld: 140 mg/dL — ABNORMAL HIGH (ref 65–99)
Potassium: 3.8 mmol/L (ref 3.5–5.1)
Sodium: 135 mmol/L (ref 135–145)
Total Bilirubin: 0.6 mg/dL (ref 0.3–1.2)
Total Protein: 6.1 g/dL — ABNORMAL LOW (ref 6.5–8.1)

## 2018-03-29 MED ORDER — IOPAMIDOL (ISOVUE-370) INJECTION 76%
50.0000 mL | Freq: Once | INTRAVENOUS | Status: AC | PRN
  Administered 2018-03-29: 50 mL via INTRAVENOUS

## 2018-03-29 MED ORDER — FENTANYL CITRATE (PF) 100 MCG/2ML IJ SOLN
50.0000 ug | Freq: Once | INTRAMUSCULAR | Status: AC
  Administered 2018-03-29: 50 ug via INTRAVENOUS
  Filled 2018-03-29: qty 2

## 2018-03-29 MED ORDER — FENTANYL CITRATE (PF) 100 MCG/2ML IJ SOLN
INTRAMUSCULAR | Status: AC
  Filled 2018-03-29: qty 2

## 2018-03-29 MED ORDER — PROCHLORPERAZINE EDISYLATE 10 MG/2ML IJ SOLN
10.0000 mg | Freq: Once | INTRAMUSCULAR | Status: AC
  Administered 2018-03-29: 10 mg via INTRAVENOUS
  Filled 2018-03-29: qty 2

## 2018-03-29 MED ORDER — LACTATED RINGERS IV BOLUS
1000.0000 mL | Freq: Once | INTRAVENOUS | Status: AC
  Administered 2018-03-29: 1000 mL via INTRAVENOUS

## 2018-03-29 MED ORDER — FENTANYL CITRATE (PF) 100 MCG/2ML IJ SOLN
50.0000 ug | Freq: Once | INTRAMUSCULAR | Status: AC
  Administered 2018-03-29: 50 ug via INTRAVENOUS
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
## 2018-03-29 NOTE — ED Notes (Signed)
Dr. Jeraldine Loots and Resident notified of pt needing more pain medication.  Verbal orders received.
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
## 2018-03-29 NOTE — ED Notes (Signed)
Assumed care of pt.  Portable x-rays being completed at this time.

## 2018-03-29 NOTE — ED Notes (Signed)
Pt in CT.

## 2018-03-30 ENCOUNTER — Inpatient Hospital Stay (HOSPITAL_COMMUNITY): Payer: 59

## 2018-03-30 ENCOUNTER — Encounter (HOSPITAL_COMMUNITY)
Admission: EM | Disposition: A | Discharge status: Rehabilitation Facility | Payer: Self-pay | Source: Home / Self Care | Attending: Neurosurgery

## 2018-03-30 ENCOUNTER — Inpatient Hospital Stay (HOSPITAL_COMMUNITY): Payer: 59 | Admitting: Certified Registered Nurse Anesthetist

## 2018-03-30 ENCOUNTER — Emergency Department (HOSPITAL_COMMUNITY): Payer: 59

## 2018-03-30 DIAGNOSIS — G8918 Other acute postprocedural pain: Secondary | ICD-10-CM | POA: Diagnosis not present

## 2018-03-30 DIAGNOSIS — S14125A 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 of cervical spinal cord, initial encounter: Secondary | ICD-10-CM | POA: Diagnosis not present

## 2018-03-30 DIAGNOSIS — S14135A Anterior cord 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 level of cervical spinal cord, initial encounter: Secondary | ICD-10-CM | POA: Diagnosis not present

## 2018-03-30 DIAGNOSIS — G959 Disease of spinal cord, unspecified: Secondary | ICD-10-CM | POA: Diagnosis not present

## 2018-03-30 DIAGNOSIS — S14154A Other incomplete lesion at C4 level of cervical spinal cord, initial encounter: Secondary | ICD-10-CM | POA: Diagnosis present

## 2018-03-30 DIAGNOSIS — R739 Hyperglycemia, unspecified: Secondary | ICD-10-CM | POA: Diagnosis not present

## 2018-03-30 DIAGNOSIS — M792 Neuralgia and neuritis, unspecified: Secondary | ICD-10-CM | POA: Diagnosis not present

## 2018-03-30 DIAGNOSIS — S14109A Unspecified injury at unspecified level of cervical spinal cord, initial encounter: Secondary | ICD-10-CM | POA: Diagnosis not present

## 2018-03-30 DIAGNOSIS — S14155D Other incomplete lesion at C5 level of cervical spinal cord, subsequent encounter: Secondary | ICD-10-CM | POA: Diagnosis not present

## 2018-03-30 DIAGNOSIS — G8250 Quadriplegia, unspecified: Secondary | ICD-10-CM | POA: Diagnosis not present

## 2018-03-30 DIAGNOSIS — K5901 Slow transit constipation: Secondary | ICD-10-CM | POA: Diagnosis not present

## 2018-03-30 DIAGNOSIS — N319 Neuromuscular dysfunction of bladder, unspecified: Secondary | ICD-10-CM | POA: Diagnosis not present

## 2018-03-30 DIAGNOSIS — T380X5A Adverse effect of glucocorticoids and synthetic analogues, initial encounter: Secondary | ICD-10-CM | POA: Diagnosis not present

## 2018-03-30 DIAGNOSIS — M4322 Fusion of spine, cervical region: Secondary | ICD-10-CM | POA: Diagnosis not present

## 2018-03-30 DIAGNOSIS — T1490XA Injury, unspecified, initial encounter: Secondary | ICD-10-CM | POA: Diagnosis not present

## 2018-03-30 DIAGNOSIS — R252 Cramp and spasm: Secondary | ICD-10-CM | POA: Diagnosis not present

## 2018-03-30 DIAGNOSIS — R29898 Other symptoms and signs involving the musculoskeletal system: Secondary | ICD-10-CM | POA: Diagnosis not present

## 2018-03-30 DIAGNOSIS — S12301A Unspecified nondisplaced fracture of fourth cervical vertebra, initial encounter for closed fracture: Secondary | ICD-10-CM | POA: Diagnosis not present

## 2018-03-30 DIAGNOSIS — G834 Cauda equina syndrome: Secondary | ICD-10-CM | POA: Diagnosis not present

## 2018-03-30 DIAGNOSIS — M79601 Pain in right arm: Secondary | ICD-10-CM | POA: Diagnosis not present

## 2018-03-30 DIAGNOSIS — I10 Essential (primary) hypertension: Secondary | ICD-10-CM | POA: Diagnosis not present

## 2018-03-30 DIAGNOSIS — R202 Paresthesia of skin: Secondary | ICD-10-CM | POA: Diagnosis not present

## 2018-03-30 DIAGNOSIS — Z888 Allergy status to other drugs, medicaments and biological substances status: Secondary | ICD-10-CM | POA: Diagnosis not present

## 2018-03-30 DIAGNOSIS — Z4789 Encounter for other orthopedic aftercare: Secondary | ICD-10-CM | POA: Diagnosis not present

## 2018-03-30 DIAGNOSIS — S14136A Anterior cord syndrome at C6 level of cervical spinal cord, initial encounter: Secondary | ICD-10-CM | POA: Diagnosis not present

## 2018-03-30 DIAGNOSIS — R200 Anesthesia of skin: Secondary | ICD-10-CM | POA: Diagnosis not present

## 2018-03-30 DIAGNOSIS — R001 Bradycardia, unspecified: Secondary | ICD-10-CM | POA: Diagnosis not present

## 2018-03-30 DIAGNOSIS — M79642 Pain in left hand: Secondary | ICD-10-CM | POA: Diagnosis not present

## 2018-03-30 DIAGNOSIS — S14134A Anterior cord syndrome at C4 level of cervical spinal cord, initial encounter: Secondary | ICD-10-CM | POA: Diagnosis not present

## 2018-03-30 DIAGNOSIS — M542 Cervicalgia: Secondary | ICD-10-CM | POA: Diagnosis present

## 2018-03-30 DIAGNOSIS — M4802 Spinal stenosis, cervical region: Secondary | ICD-10-CM | POA: Diagnosis present

## 2018-03-30 DIAGNOSIS — Z885 Allergy status to narcotic agent status: Secondary | ICD-10-CM | POA: Diagnosis not present

## 2018-03-30 DIAGNOSIS — S14125S 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 of cervical spinal cord, sequela: Secondary | ICD-10-CM | POA: Diagnosis not present

## 2018-03-30 DIAGNOSIS — J45909 Unspecified asthma, uncomplicated: Secondary | ICD-10-CM | POA: Diagnosis not present

## 2018-03-30 DIAGNOSIS — Y9355 Activity, bike riding: Secondary | ICD-10-CM | POA: Diagnosis not present

## 2018-03-30 DIAGNOSIS — K592 Neurogenic bowel, not elsewhere classified: Secondary | ICD-10-CM | POA: Diagnosis not present

## 2018-03-30 HISTORY — PX: ANTERIOR CERVICAL DECOMP/DISCECTOMY FUSION: SHX1161

## 2018-03-30 LAB — CBC WITH DIFFERENTIAL/PLATELET
Basophils Absolute: 0 10*3/uL (ref 0.0–0.1)
Basophils Relative: 0 %
Eosinophils Absolute: 0 10*3/uL (ref 0.0–0.7)
Eosinophils Relative: 0 %
HCT: 38.4 % (ref 36.0–46.0)
Hemoglobin: 13.2 g/dL (ref 12.0–15.0)
Lymphocytes Relative: 23 %
Lymphs Abs: 1.6 10*3/uL (ref 0.7–4.0)
MCH: 32.8 pg (ref 26.0–34.0)
MCHC: 34.4 g/dL (ref 30.0–36.0)
MCV: 95.5 fL (ref 78.0–100.0)
Monocytes Absolute: 0.6 10*3/uL (ref 0.1–1.0)
Monocytes Relative: 8 %
Neutro Abs: 4.9 10*3/uL (ref 1.7–7.7)
Neutrophils Relative %: 69 %
Platelets: 264 10*3/uL (ref 150–400)
RBC: 4.02 MIL/uL (ref 3.87–5.11)
RDW: 12.4 % (ref 11.5–15.5)
WBC: 7.2 10*3/uL (ref 4.0–10.5)

## 2018-03-30 LAB — HIV ANTIBODY (ROUTINE TESTING W REFLEX): HIV Screen 4th Generation wRfx: NONREACTIVE

## 2018-03-30 LAB — PROTIME-INR
INR: 1.03
Prothrombin Time: 13.4 seconds (ref 11.4–15.2)

## 2018-03-30 LAB — APTT: aPTT: 26 seconds (ref 24–36)

## 2018-03-30 SURGERY — ANTERIOR CERVICAL DECOMPRESSION/DISCECTOMY FUSION 2 LEVELS
Anesthesia: General | Site: Neck

## 2018-03-30 MED ORDER — THROMBIN 5000 UNITS EX SOLR
CUTANEOUS | Status: AC
  Filled 2018-03-30: qty 5000

## 2018-03-30 MED ORDER — MENTHOL 3 MG MT LOZG
1.0000 | LOZENGE | OROMUCOSAL | Status: DC | PRN
  Filled 2018-03-30: qty 9

## 2018-03-30 MED ORDER — PROPOFOL 10 MG/ML IV BOLUS
INTRAVENOUS | Status: AC
  Filled 2018-03-30: qty 20

## 2018-03-30 MED ORDER — SUGAMMADEX SODIUM 200 MG/2ML IV SOLN
INTRAVENOUS | Status: AC
  Filled 2018-03-30: qty 2

## 2018-03-30 MED ORDER — HEMOSTATIC AGENTS (NO CHARGE) OPTIME
TOPICAL | Status: DC | PRN
  Administered 2018-03-30: 1 via TOPICAL

## 2018-03-30 MED ORDER — ONDANSETRON HCL 4 MG/2ML IJ SOLN: 4.0000 mg | Freq: Once | INTRAMUSCULAR | Status: DC | PRN

## 2018-03-30 MED ORDER — SENNOSIDES-DOCUSATE SODIUM 8.6-50 MG PO TABS: 1.0000 | ORAL_TABLET | Freq: Every evening | ORAL | Status: DC | PRN

## 2018-03-30 MED ORDER — LIDOCAINE 2% (20 MG/ML) 5 ML SYRINGE
INTRAMUSCULAR | Status: AC
  Filled 2018-03-30: qty 5

## 2018-03-30 MED ORDER — MIDAZOLAM HCL 2 MG/2ML IJ SOLN
INTRAMUSCULAR | Status: DC | PRN
  Administered 2018-03-30 (×2): 1 mg via INTRAVENOUS

## 2018-03-30 MED ORDER — MAGNESIUM CITRATE PO SOLN: 1.0000 | Freq: Once | ORAL | Status: DC | PRN

## 2018-03-30 MED ORDER — POTASSIUM CHLORIDE IN NACL 20-0.9 MEQ/L-% IV SOLN
INTRAVENOUS | Status: DC
  Administered 2018-03-30: 05:00:00 via INTRAVENOUS
  Filled 2018-03-30 (×3): qty 1000

## 2018-03-30 MED ORDER — ONDANSETRON HCL 4 MG PO TABS: 4.0000 mg | ORAL_TABLET | Freq: Four times a day (QID) | ORAL | Status: DC | PRN

## 2018-03-30 MED ORDER — GABAPENTIN 300 MG PO CAPS
300.0000 mg | ORAL_CAPSULE | Freq: Three times a day (TID) | ORAL | Status: DC
  Administered 2018-03-30 – 2018-04-02 (×10): 300 mg via ORAL
  Filled 2018-03-30 (×10): qty 1

## 2018-03-30 MED ORDER — SUGAMMADEX SODIUM 200 MG/2ML IV SOLN
INTRAVENOUS | Status: DC | PRN
  Administered 2018-03-30: 200 mg via INTRAVENOUS

## 2018-03-30 MED ORDER — ONDANSETRON HCL 4 MG/2ML IJ SOLN: 4.0000 mg | Freq: Four times a day (QID) | INTRAMUSCULAR | Status: DC | PRN

## 2018-03-30 MED ORDER — POTASSIUM CHLORIDE IN NACL 20-0.9 MEQ/L-% IV SOLN
INTRAVENOUS | Status: DC
  Administered 2018-03-30: 22:00:00 via INTRAVENOUS
  Filled 2018-03-30: qty 1000

## 2018-03-30 MED ORDER — FENTANYL CITRATE (PF) 100 MCG/2ML IJ SOLN: 25.0000 ug | INTRAMUSCULAR | Status: DC | PRN

## 2018-03-30 MED ORDER — LEVETIRACETAM IN NACL 1000 MG/100ML IV SOLN
1000.0000 mg | Freq: Once | INTRAVENOUS | Status: AC
  Administered 2018-03-30: 1000 mg via INTRAVENOUS
  Filled 2018-03-30: qty 100

## 2018-03-30 MED ORDER — CONJ ESTROG-MEDROXYPROGEST ACE 0.625-2.5 MG PO TABS: 1.0000 | ORAL_TABLET | Freq: Every day | ORAL | Status: DC

## 2018-03-30 MED ORDER — THROMBIN (RECOMBINANT) 5000 UNITS EX SOLR
CUTANEOUS | Status: DC | PRN
  Administered 2018-03-30: 5 mL via TOPICAL

## 2018-03-30 MED ORDER — ACETAMINOPHEN 10 MG/ML IV SOLN
INTRAVENOUS | Status: DC | PRN
  Administered 2018-03-30: 1000 mg via INTRAVENOUS

## 2018-03-30 MED ORDER — SUCCINYLCHOLINE CHLORIDE 200 MG/10ML IV SOSY
PREFILLED_SYRINGE | INTRAVENOUS | Status: AC
  Filled 2018-03-30: qty 10

## 2018-03-30 MED ORDER — EPHEDRINE SULFATE 50 MG/ML IJ SOLN
INTRAMUSCULAR | Status: DC | PRN
  Administered 2018-03-30 (×3): 5 mg via INTRAVENOUS

## 2018-03-30 MED ORDER — LACTATED RINGERS IV SOLN
INTRAVENOUS | Status: DC
  Administered 2018-03-30: 13:00:00 via INTRAVENOUS

## 2018-03-30 MED ORDER — SODIUM CHLORIDE 0.9% FLUSH: 3.0000 mL | INTRAVENOUS | Status: DC | PRN

## 2018-03-30 MED ORDER — GLYCOPYRROLATE 0.2 MG/ML IJ SOLN
INTRAMUSCULAR | Status: DC | PRN
  Administered 2018-03-30: 0.2 mg via INTRAVENOUS

## 2018-03-30 MED ORDER — DOCUSATE SODIUM 100 MG PO CAPS
100.0000 mg | ORAL_CAPSULE | Freq: Two times a day (BID) | ORAL | Status: DC
  Administered 2018-03-30 – 2018-04-02 (×6): 100 mg via ORAL
  Filled 2018-03-30 (×6): qty 1

## 2018-03-30 MED ORDER — VITAMIN B-12 1000 MCG PO TABS
1000.0000 ug | ORAL_TABLET | Freq: Every day | ORAL | Status: DC
  Administered 2018-03-30 – 2018-04-01 (×3): 1000 ug via ORAL
  Filled 2018-03-30 (×3): qty 1

## 2018-03-30 MED ORDER — ONDANSETRON HCL 4 MG/2ML IJ SOLN
INTRAMUSCULAR | Status: AC
  Filled 2018-03-30: qty 2

## 2018-03-30 MED ORDER — ACETAMINOPHEN 10 MG/ML IV SOLN
INTRAVENOUS | Status: AC
  Filled 2018-03-30: qty 100

## 2018-03-30 MED ORDER — FENTANYL CITRATE (PF) 100 MCG/2ML IJ SOLN
50.0000 ug | Freq: Once | INTRAMUSCULAR | Status: AC
  Administered 2018-03-30: 50 ug via INTRAVENOUS
  Filled 2018-03-30: qty 2

## 2018-03-30 MED ORDER — FENTANYL CITRATE (PF) 250 MCG/5ML IJ SOLN
INTRAMUSCULAR | Status: AC
  Filled 2018-03-30: qty 5

## 2018-03-30 MED ORDER — CEFAZOLIN SODIUM-DEXTROSE 2-3 GM-%(50ML) IV SOLR
INTRAVENOUS | Status: DC | PRN
  Administered 2018-03-30: 2 g via INTRAVENOUS

## 2018-03-30 MED ORDER — 0.9 % SODIUM CHLORIDE (POUR BTL) OPTIME
TOPICAL | Status: DC | PRN
  Administered 2018-03-30: 1000 mL

## 2018-03-30 MED ORDER — ROCURONIUM BROMIDE 10 MG/ML (PF) SYRINGE
PREFILLED_SYRINGE | INTRAVENOUS | Status: DC | PRN
Start: 2018-03-30 — End: 2018-03-30
  Administered 2018-03-30: 10 mg via INTRAVENOUS
  Administered 2018-03-30: 40 mg via INTRAVENOUS

## 2018-03-30 MED ORDER — LIDOCAINE 2% (20 MG/ML) 5 ML SYRINGE
INTRAMUSCULAR | Status: DC | PRN
  Administered 2018-03-30: 20 mg via INTRAVENOUS

## 2018-03-30 MED ORDER — SODIUM CHLORIDE 0.9 % IV SOLN: 250.0000 mL | INTRAVENOUS | Status: DC | PRN

## 2018-03-30 MED ORDER — ACETAMINOPHEN 650 MG RE SUPP: 650.0000 mg | Freq: Four times a day (QID) | RECTAL | Status: DC | PRN

## 2018-03-30 MED ORDER — ACETAMINOPHEN 325 MG PO TABS
650.0000 mg | ORAL_TABLET | ORAL | Status: DC | PRN
Start: 2018-03-30 — End: 2018-04-02
  Administered 2018-03-31 (×4): 650 mg via ORAL
  Filled 2018-03-30 (×4): qty 2

## 2018-03-30 MED ORDER — DEXAMETHASONE SODIUM PHOSPHATE 10 MG/ML IJ SOLN
6.0000 mg | Freq: Four times a day (QID) | INTRAMUSCULAR | Status: DC
  Administered 2018-03-30 – 2018-03-31 (×6): 6 mg via INTRAVENOUS
  Filled 2018-03-30 (×6): qty 1

## 2018-03-30 MED ORDER — EPHEDRINE SULFATE 50 MG/ML IJ SOLN
INTRAMUSCULAR | Status: AC
  Filled 2018-03-30: qty 1

## 2018-03-30 MED ORDER — TRAZODONE HCL 50 MG PO TABS
25.0000 mg | ORAL_TABLET | Freq: Every evening | ORAL | Status: DC | PRN
  Administered 2018-03-30 – 2018-03-31 (×2): 25 mg via ORAL
  Filled 2018-03-30 (×2): qty 1

## 2018-03-30 MED ORDER — THROMBIN 5000 UNITS EX SOLR
CUTANEOUS | Status: AC
  Filled 2018-03-30: qty 10000

## 2018-03-30 MED ORDER — LIDOCAINE-EPINEPHRINE 0.5 %-1:200000 IJ SOLN
INTRAMUSCULAR | Status: DC | PRN
  Administered 2018-03-30: 6 mL via INTRADERMAL

## 2018-03-30 MED ORDER — MIDAZOLAM HCL 2 MG/2ML IJ SOLN
INTRAMUSCULAR | Status: AC
  Filled 2018-03-30: qty 2

## 2018-03-30 MED ORDER — SODIUM CHLORIDE 0.9% FLUSH
3.0000 mL | Freq: Two times a day (BID) | INTRAVENOUS | Status: DC
  Administered 2018-03-30 – 2018-04-02 (×4): 3 mL via INTRAVENOUS

## 2018-03-30 MED ORDER — OXYCODONE HCL 5 MG PO TABS
5.0000 mg | ORAL_TABLET | ORAL | Status: DC | PRN
  Administered 2018-03-30 – 2018-04-02 (×15): 5 mg via ORAL
  Filled 2018-03-30 (×15): qty 1

## 2018-03-30 MED ORDER — LIDOCAINE-EPINEPHRINE 0.5 %-1:200000 IJ SOLN
INTRAMUSCULAR | Status: AC
  Filled 2018-03-30: qty 1

## 2018-03-30 MED ORDER — PROPOFOL 10 MG/ML IV BOLUS
INTRAVENOUS | Status: DC | PRN
  Administered 2018-03-30: 100 mg via INTRAVENOUS

## 2018-03-30 MED ORDER — PHENYLEPHRINE HCL 10 MG/ML IJ SOLN
INTRAVENOUS | Status: DC | PRN
  Administered 2018-03-30: 15 ug/min via INTRAVENOUS

## 2018-03-30 MED ORDER — DEXAMETHASONE SODIUM PHOSPHATE 10 MG/ML IJ SOLN
INTRAMUSCULAR | Status: AC
  Filled 2018-03-30: qty 1

## 2018-03-30 MED ORDER — PHENOL 1.4 % MT LIQD: 1.0000 | OROMUCOSAL | Status: DC | PRN

## 2018-03-30 MED ORDER — ROCURONIUM BROMIDE 50 MG/5ML IV SOLN
INTRAVENOUS | Status: AC
  Filled 2018-03-30: qty 1

## 2018-03-30 MED ORDER — DEXAMETHASONE SODIUM PHOSPHATE 10 MG/ML IJ SOLN
INTRAMUSCULAR | Status: DC | PRN
  Administered 2018-03-30: 10 mg via INTRAVENOUS

## 2018-03-30 MED ORDER — ONDANSETRON HCL 4 MG/2ML IJ SOLN
INTRAMUSCULAR | Status: DC | PRN
  Administered 2018-03-30: 4 mg via INTRAVENOUS

## 2018-03-30 MED ORDER — SODIUM CHLORIDE 0.9 % IV SOLN: 250.0000 mL | INTRAVENOUS | Status: DC

## 2018-03-30 MED ORDER — BISACODYL 5 MG PO TBEC: 5.0000 mg | DELAYED_RELEASE_TABLET | Freq: Every day | ORAL | Status: DC | PRN

## 2018-03-30 MED ORDER — ACETAMINOPHEN 650 MG RE SUPP: 650.0000 mg | RECTAL | Status: DC | PRN

## 2018-03-30 MED ORDER — ACETAMINOPHEN 325 MG PO TABS: 650.0000 mg | ORAL_TABLET | Freq: Four times a day (QID) | ORAL | Status: DC | PRN

## 2018-03-30 MED ORDER — CEFAZOLIN SODIUM-DEXTROSE 2-4 GM/100ML-% IV SOLN
INTRAVENOUS | Status: AC
  Filled 2018-03-30: qty 100

## 2018-03-30 MED ORDER — FENTANYL CITRATE (PF) 250 MCG/5ML IJ SOLN
INTRAMUSCULAR | Status: DC | PRN
  Administered 2018-03-30 (×5): 50 ug via INTRAVENOUS

## 2018-03-30 MED ORDER — SODIUM CHLORIDE 0.9% FLUSH
3.0000 mL | Freq: Two times a day (BID) | INTRAVENOUS | Status: DC
  Administered 2018-03-30 – 2018-04-01 (×3): 3 mL via INTRAVENOUS

## 2018-03-30 MED ORDER — THROMBIN 5000 UNITS EX SOLR
CUTANEOUS | Status: DC | PRN
  Administered 2018-03-30 (×2): 5000 [IU] via TOPICAL

## 2018-03-30 MED ORDER — VITAMIN C 500 MG PO TABS
500.0000 mg | ORAL_TABLET | Freq: Every day | ORAL | Status: DC
  Administered 2018-03-30 – 2018-04-01 (×3): 500 mg via ORAL
  Filled 2018-03-30 (×3): qty 1

## 2018-03-30 SURGICAL SUPPLY — 48 items
BUR DRUM 4.0 (BURR) ×2 IMPLANT
BUR MATCHSTICK NEURO 3.0 LAGG (BURR) ×2 IMPLANT
CANISTER SUCT 3000ML PPV (MISCELLANEOUS) ×2 IMPLANT
CARTRIDGE OIL MAESTRO DRILL (MISCELLANEOUS) ×1 IMPLANT
DECANTER SPIKE VIAL GLASS SM (MISCELLANEOUS) ×2 IMPLANT
DERMABOND ADVANCED (GAUZE/BANDAGES/DRESSINGS) ×1
DERMABOND ADVANCED .7 DNX12 (GAUZE/BANDAGES/DRESSINGS) ×1 IMPLANT
DIFFUSER DRILL AIR PNEUMATIC (MISCELLANEOUS) ×2 IMPLANT
DRAPE HALF SHEET 40X57 (DRAPES) IMPLANT
DRAPE LAPAROTOMY 100X72 PEDS (DRAPES) ×2 IMPLANT
DRAPE MICROSCOPE LEICA (MISCELLANEOUS) ×2 IMPLANT
DURAPREP 6ML APPLICATOR 50/CS (WOUND CARE) ×2 IMPLANT
ELECT COATED BLADE 2.86 ST (ELECTRODE) ×2 IMPLANT
ELECT REM PT RETURN 9FT ADLT (ELECTROSURGICAL) ×2
ELECTRODE REM PT RTRN 9FT ADLT (ELECTROSURGICAL) ×1 IMPLANT
GAUZE SPONGE 4X4 16PLY XRAY LF (GAUZE/BANDAGES/DRESSINGS) IMPLANT
GLOVE BIOGEL PI IND STRL 8 (GLOVE) ×2 IMPLANT
GLOVE BIOGEL PI INDICATOR 8 (GLOVE) ×2
GLOVE ECLIPSE 6.5 STRL STRAW (GLOVE) ×2 IMPLANT
GLOVE ECLIPSE 8.0 STRL XLNG CF (GLOVE) ×4 IMPLANT
GLOVE EXAM NITRILE LRG STRL (GLOVE) IMPLANT
GLOVE EXAM NITRILE XL STR (GLOVE) IMPLANT
GLOVE EXAM NITRILE XS STR PU (GLOVE) IMPLANT
GOWN STRL REUS W/ TWL LRG LVL3 (GOWN DISPOSABLE) ×1 IMPLANT
GOWN STRL REUS W/ TWL XL LVL3 (GOWN DISPOSABLE) IMPLANT
GOWN STRL REUS W/TWL 2XL LVL3 (GOWN DISPOSABLE) ×4 IMPLANT
GOWN STRL REUS W/TWL LRG LVL3 (GOWN DISPOSABLE) ×2
GOWN STRL REUS W/TWL XL LVL3 (GOWN DISPOSABLE)
KIT BASIN OR (CUSTOM PROCEDURE TRAY) ×2 IMPLANT
KIT TURNOVER KIT B (KITS) ×2 IMPLANT
NEEDLE HYPO 25X1 1.5 SAFETY (NEEDLE) ×2 IMPLANT
NEEDLE SPNL 22GX3.5 QUINCKE BK (NEEDLE) ×2 IMPLANT
NS IRRIG 1000ML POUR BTL (IV SOLUTION) ×2 IMPLANT
OIL CARTRIDGE MAESTRO DRILL (MISCELLANEOUS) ×2
PACK LAMINECTOMY NEURO (CUSTOM PROCEDURE TRAY) ×2 IMPLANT
PAD ARMBOARD 7.5X6 YLW CONV (MISCELLANEOUS) ×6 IMPLANT
PLATE VECTRA 28MM (Plate) ×2 IMPLANT
RUBBERBAND STERILE (MISCELLANEOUS) ×4 IMPLANT
SCREW SELF TAP 4.0X14MM (Screw) ×12 IMPLANT
SPACER PARALLEL 6MM CC ACF (Bone Implant) ×4 IMPLANT
SPONGE INTESTINAL PEANUT (DISPOSABLE) ×2 IMPLANT
SPONGE SURGIFOAM ABS GEL SZ50 (HEMOSTASIS) ×2 IMPLANT
SUT VIC AB 0 CT1 27 (SUTURE) ×2
SUT VIC AB 0 CT1 27XBRD ANTBC (SUTURE) ×1 IMPLANT
SUT VIC AB 3-0 SH 8-18 (SUTURE) ×4 IMPLANT
TOWEL GREEN STERILE (TOWEL DISPOSABLE) ×2 IMPLANT
TOWEL GREEN STERILE FF (TOWEL DISPOSABLE) ×2 IMPLANT
WATER STERILE IRR 1000ML POUR (IV SOLUTION) ×2 IMPLANT
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
## 2018-03-30 NOTE — Transfer of Care (Signed)
Immediate Anesthesia Transfer of Care Note  Patient: Kelly Wall  Procedure(s) Performed: ANTERIOR CERVICAL DECOMPRESSION/DISCECTOMY FUSION Cervical four-five and Cervical five-six (N/A Neck)  Patient Location: PACU  Anesthesia Type:General  Level of Consciousness: awake  Airway & Oxygen Therapy: Patient Spontanous Breathing and Patient connected to nasal cannula oxygen  Post-op Assessment: Report given to RN and Post -op Vital signs reviewed and stable  Post vital signs: Reviewed and stable  Last Vitals:  Vitals Value Taken Time  BP 92/42 03/30/2018  4:05 PM  Temp    Pulse 58 03/30/2018  4:09 PM  Resp 11 03/30/2018  4:09 PM  SpO2 96 % 03/30/2018  4:09 PM  Vitals shown include unvalidated device data.  Last Pain:  Vitals:   03/30/18 1219  TempSrc:   PainSc: 3          Complications: No apparent anesthesia complications
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
## 2018-03-30 NOTE — Anesthesia Preprocedure Evaluation (Addendum)
Anesthesia Evaluation  Patient identified by MRN, date of birth, ID band Patient awake    Reviewed: Allergy & Precautions, NPO status , Patient's Chart, lab work & pertinent test results  Airway Mallampati: II  TM Distance: >3 FB Neck ROM: Limited    Dental  (+) Edentulous Upper, Partial Lower   Pulmonary    breath sounds clear to auscultation       Cardiovascular hypertension,  Rhythm:Regular Rate:Normal     Neuro/Psych    GI/Hepatic   Endo/Other    Renal/GU      Musculoskeletal   Abdominal   Peds  Hematology   Anesthesia Other Findings   Reproductive/Obstetrics                            Anesthesia Physical Anesthesia Plan  ASA: III and emergent  Anesthesia Plan: General   Post-op Pain Management:    Induction: Intravenous  PONV Risk Score and Plan: Ondansetron and Dexamethasone  Airway Management Planned: Oral ETT and Video Laryngoscope Planned  Additional Equipment:   Intra-op Plan:   Post-operative Plan: Extubation in OR  Informed Consent: I have reviewed the patients History and Physical, chart, labs and discussed the procedure including the risks, benefits and alternatives for the proposed anesthesia with the patient or authorized representative who has indicated his/her understanding and acceptance.     Plan Discussed with: CRNA and Anesthesiologist  Anesthesia Plan Comments: (Plan GA with glide scope intubation keep neck in strict neutral position)        Anesthesia Quick Evaluation

## 2018-03-30 NOTE — ED Notes (Signed)
Rommel Fema [phone] (Brother) is going home and would like to be updated with any changes and when pt is going to OR.

## 2018-03-30 NOTE — ED Notes (Signed)
Dr. Franky Macho in to see pt.  Verbal order for Keppra.  MRI here to get pt.  She is requesting additional pain medication due to pain increased to 10/10 again.  Dr. Jeraldine Loots notified and orders received.

## 2018-03-30 NOTE — Progress Notes (Signed)
2 bags of belongings taken to PACU. They contained her lower partial plate and full upper denture.

## 2018-03-30 NOTE — Anesthesia Procedure Notes (Signed)
Procedure Name: Intubation Date/Time: 03/30/2018 1:38 PM Performed by: Dairl Ponder, CRNA Pre-anesthesia Checklist: Patient identified, Emergency Drugs available, Suction available, Patient being monitored and Timeout performed Patient Re-evaluated:Patient Re-evaluated prior to induction Oxygen Delivery Method: Circle system utilized Preoxygenation: Pre-oxygenation with 100% oxygen Induction Type: IV induction Ventilation: Mask ventilation without difficulty and Oral airway inserted - appropriate to patient size Laryngoscope Size: Glidescope and 3 (limited neck ROM) Grade View: Grade I Tube type: Oral Tube size: 7.0 mm Number of attempts: 1 Airway Equipment and Method: Stylet Placement Confirmation: ETT inserted through vocal cords under direct vision,  positive ETCO2 and breath sounds checked- equal and bilateral Secured at: 22 cm Tube secured with: Tape Dental Injury: Teeth and Oropharynx as per pre-operative assessment

## 2018-03-30 NOTE — ED Provider Notes (Signed)
Care in sign out to follow-up on MR imaging.  Patient has multiple abnormalities on MRI C-spine.  I discussed the case with Dr. Franky Macho he will see patient.  I discussed this with patient and family.  IV pain medicines have been ordered.  She denies any chest pain or abdominal pain.  She has pain in her neck and in both of her arms.  She has weakness in both of her arms.  She remains in a cervical collar   Zadie Rhine, MD 03/30/18 [phone]

## 2018-03-30 NOTE — ED Notes (Addendum)
While in the room to get a temp, pt did not want to be touched at all due to fear of pain, temp was 98.3 oral

## 2018-03-30 NOTE — ED Notes (Signed)
Pt to MRI
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
## 2018-03-30 NOTE — ED Notes (Signed)
Returned from MRI 

## 2018-03-30 NOTE — ED Notes (Signed)
Pt placed on hospital bed for comfort.

## 2018-03-30 NOTE — Progress Notes (Signed)
Patient alert and oriented x 4. Patient states unable to move arms enough to sign consent. Verbal consent obtained with second RN witnessing consent.

## 2018-03-31 ENCOUNTER — Other Ambulatory Visit: Payer: Self-pay

## 2018-03-31 MED ORDER — DEXAMETHASONE 4 MG PO TABS
4.0000 mg | ORAL_TABLET | Freq: Two times a day (BID) | ORAL | Status: AC
  Administered 2018-03-31 – 2018-04-01 (×4): 4 mg via ORAL
  Filled 2018-03-31 (×4): qty 1

## 2018-03-31 MED ORDER — MEDROXYPROGESTERONE ACETATE 2.5 MG PO TABS
2.5000 mg | ORAL_TABLET | Freq: Every day | ORAL | Status: DC
  Administered 2018-03-31 – 2018-04-01 (×2): 2.5 mg via ORAL
  Filled 2018-03-31 (×3): qty 1

## 2018-03-31 MED ORDER — ESTROGENS CONJUGATED 0.625 MG PO TABS
0.6250 mg | ORAL_TABLET | Freq: Every day | ORAL | Status: DC
  Administered 2018-03-31 – 2018-04-01 (×2): 0.625 mg via ORAL
  Filled 2018-03-31 (×3): qty 1
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
## 2018-03-31 NOTE — Progress Notes (Signed)
Patient ID: Kelly Wall, female   DOB: 03-17-1961, 57 y.o.   MRN: [id number] BP (!) 99/57 (BP Location: Right Arm)   Pulse (!) 44   Temp 98.2 F (36.8 C) (Oral)   Resp 16   SpO2 99%  Alert and oriented x 4, speech is clear and fluent Continues with weakness in hands, 1/5 Wound is clean, dry Working with PT
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
## 2018-04-01 ENCOUNTER — Encounter (HOSPITAL_COMMUNITY): Payer: Self-pay | Admitting: Neurosurgery

## 2018-04-01 DIAGNOSIS — T1490XA Injury, unspecified, initial encounter: Secondary | ICD-10-CM

## 2018-04-01 DIAGNOSIS — R739 Hyperglycemia, unspecified: Secondary | ICD-10-CM

## 2018-04-01 DIAGNOSIS — R29898 Other symptoms and signs involving the musculoskeletal system: Secondary | ICD-10-CM

## 2018-04-01 DIAGNOSIS — R202 Paresthesia of skin: Secondary | ICD-10-CM

## 2018-04-01 DIAGNOSIS — G8918 Other acute postprocedural pain: Secondary | ICD-10-CM

## 2018-04-01 DIAGNOSIS — M79602 Pain in left arm: Secondary | ICD-10-CM

## 2018-04-01 DIAGNOSIS — S14125A 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 of cervical spinal cord, initial encounter: Secondary | ICD-10-CM

## 2018-04-01 DIAGNOSIS — J45909 Unspecified asthma, uncomplicated: Secondary | ICD-10-CM

## 2018-04-01 DIAGNOSIS — R200 Anesthesia of skin: Secondary | ICD-10-CM

## 2018-04-01 DIAGNOSIS — T380X5A Adverse effect of glucocorticoids and synthetic analogues, initial encounter: Secondary | ICD-10-CM

## 2018-04-01 DIAGNOSIS — R001 Bradycardia, unspecified: Secondary | ICD-10-CM

## 2018-04-01 DIAGNOSIS — I10 Essential (primary) hypertension: Secondary | ICD-10-CM

## 2018-04-01 DIAGNOSIS — M79601 Pain in right arm: Secondary | ICD-10-CM
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
## 2018-04-01 NOTE — Progress Notes (Signed)
Patient ID: Kelly Wall, female   DOB: 08/15/1961, 57 y.o.   MRN: [id number] BP 110/62 (BP Location: Right Arm)   Pulse (!) 50   Temp 97.8 F (36.6 C) (Oral)   Resp 15   SpO2 97%  Alert, moving upper extremities, 3/5 biceps, 1/5 intrinsics, grip Weakness right lower extremity, hip flexors,extensors Wound is clean,dry, no signs of infection Awaiting space on rehab
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
## 2018-04-01 NOTE — Progress Notes (Signed)
Rehab admissions - I met with patient.  She would like inpatient rehab admission.  I will open the case and request acute inpatient rehab admission.  I will follow up tomorrow after I hear back from insurance carrier.  Call me for questions.  #[phone]

## 2018-04-02 ENCOUNTER — Other Ambulatory Visit: Payer: Self-pay

## 2018-04-02 ENCOUNTER — Inpatient Hospital Stay (HOSPITAL_COMMUNITY)
Admission: RE | Admit: 2018-04-02 | Discharge: 2018-04-23 | DRG: 559 | Disposition: A | Discharge status: Home / Self Care | Payer: 59 | Source: Intra-hospital | Attending: Physical Medicine & Rehabilitation | Admitting: Physical Medicine & Rehabilitation

## 2018-04-02 ENCOUNTER — Encounter (HOSPITAL_COMMUNITY): Payer: Self-pay

## 2018-04-02 DIAGNOSIS — G834 Cauda equina syndrome: Secondary | ICD-10-CM | POA: Diagnosis not present

## 2018-04-02 DIAGNOSIS — S14125S 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 of cervical spinal cord, sequela: Secondary | ICD-10-CM | POA: Diagnosis not present

## 2018-04-02 DIAGNOSIS — G8250 Quadriplegia, unspecified: Secondary | ICD-10-CM | POA: Diagnosis present

## 2018-04-02 DIAGNOSIS — M792 Neuralgia and neuritis, unspecified: Secondary | ICD-10-CM | POA: Diagnosis not present

## 2018-04-02 DIAGNOSIS — S14155D Other incomplete lesion at C5 level of cervical spinal cord, subsequent encounter: Secondary | ICD-10-CM

## 2018-04-02 DIAGNOSIS — M79642 Pain in left hand: Secondary | ICD-10-CM | POA: Diagnosis not present

## 2018-04-02 DIAGNOSIS — G959 Disease of spinal cord, unspecified: Secondary | ICD-10-CM | POA: Diagnosis present

## 2018-04-02 DIAGNOSIS — Z4789 Encounter for other orthopedic aftercare: Principal | ICD-10-CM

## 2018-04-02 DIAGNOSIS — I10 Essential (primary) hypertension: Secondary | ICD-10-CM | POA: Diagnosis not present

## 2018-04-02 DIAGNOSIS — N319 Neuromuscular dysfunction of bladder, unspecified: Secondary | ICD-10-CM | POA: Diagnosis present

## 2018-04-02 DIAGNOSIS — K592 Neurogenic bowel, not elsewhere classified: Secondary | ICD-10-CM

## 2018-04-02 DIAGNOSIS — R252 Cramp and spasm: Secondary | ICD-10-CM | POA: Diagnosis not present

## 2018-04-02 DIAGNOSIS — M7989 Other specified soft tissue disorders: Secondary | ICD-10-CM | POA: Diagnosis not present

## 2018-04-02 DIAGNOSIS — K5901 Slow transit constipation: Secondary | ICD-10-CM | POA: Diagnosis not present

## 2018-04-02 MED ORDER — ESTROGENS CONJUGATED 0.625 MG PO TABS
0.6250 mg | ORAL_TABLET | Freq: Every day | ORAL | Status: DC
  Administered 2018-04-02 – 2018-04-22 (×21): 0.625 mg via ORAL
  Filled 2018-04-02 (×21): qty 1

## 2018-04-02 MED ORDER — GABAPENTIN 300 MG PO CAPS
300.0000 mg | ORAL_CAPSULE | Freq: Three times a day (TID) | ORAL | Status: DC
  Administered 2018-04-02 – 2018-04-03 (×2): 300 mg via ORAL
  Filled 2018-04-02 (×2): qty 1

## 2018-04-02 MED ORDER — SENNOSIDES-DOCUSATE SODIUM 8.6-50 MG PO TABS
1.0000 | ORAL_TABLET | Freq: Every evening | ORAL | Status: DC | PRN
  Administered 2018-04-04: 1 via ORAL
  Filled 2018-04-02: qty 1

## 2018-04-02 MED ORDER — ACETAMINOPHEN 325 MG PO TABS
650.0000 mg | ORAL_TABLET | ORAL | Status: DC | PRN
  Administered 2018-04-03 – 2018-04-23 (×15): 650 mg via ORAL
  Filled 2018-04-02 (×16): qty 2

## 2018-04-02 MED ORDER — VITAMIN C 500 MG PO TABS
500.0000 mg | ORAL_TABLET | Freq: Every day | ORAL | Status: DC
  Administered 2018-04-02 – 2018-04-22 (×21): 500 mg via ORAL
  Filled 2018-04-02 (×21): qty 1

## 2018-04-02 MED ORDER — ONDANSETRON HCL 4 MG PO TABS: 4.0000 mg | ORAL_TABLET | Freq: Four times a day (QID) | ORAL | Status: DC | PRN

## 2018-04-02 MED ORDER — VITAMIN B-12 1000 MCG PO TABS
1000.0000 ug | ORAL_TABLET | Freq: Every day | ORAL | Status: DC
  Administered 2018-04-02 – 2018-04-22 (×21): 1000 ug via ORAL
  Filled 2018-04-02 (×21): qty 1

## 2018-04-02 MED ORDER — SORBITOL 70 % SOLN
30.0000 mL | Freq: Every day | Status: DC | PRN
  Administered 2018-04-04 – 2018-04-06 (×2): 30 mL via ORAL
  Filled 2018-04-02 (×3): qty 30

## 2018-04-02 MED ORDER — TRAZODONE HCL 50 MG PO TABS
25.0000 mg | ORAL_TABLET | Freq: Every evening | ORAL | Status: DC | PRN
  Administered 2018-04-18: 25 mg via ORAL
  Filled 2018-04-02: qty 1

## 2018-04-02 MED ORDER — OXYCODONE HCL 5 MG PO TABS
5.0000 mg | ORAL_TABLET | ORAL | Status: DC | PRN
  Administered 2018-04-02 – 2018-04-23 (×76): 5 mg via ORAL
  Filled 2018-04-02 (×77): qty 1

## 2018-04-02 MED ORDER — BISACODYL 5 MG PO TBEC: 5.0000 mg | DELAYED_RELEASE_TABLET | Freq: Every day | ORAL | Status: DC | PRN

## 2018-04-02 MED ORDER — DOCUSATE SODIUM 100 MG PO CAPS
100.0000 mg | ORAL_CAPSULE | Freq: Two times a day (BID) | ORAL | Status: DC
  Administered 2018-04-02 – 2018-04-16 (×28): 100 mg via ORAL
  Filled 2018-04-02 (×28): qty 1

## 2018-04-02 MED ORDER — ACETAMINOPHEN 650 MG RE SUPP: 650.0000 mg | RECTAL | Status: DC | PRN

## 2018-04-02 MED ORDER — ONDANSETRON HCL 4 MG/2ML IJ SOLN: 4.0000 mg | Freq: Four times a day (QID) | INTRAMUSCULAR | Status: DC | PRN

## 2018-04-02 MED ORDER — MEDROXYPROGESTERONE ACETATE 2.5 MG PO TABS
2.5000 mg | ORAL_TABLET | Freq: Every day | ORAL | Status: DC
  Administered 2018-04-02 – 2018-04-22 (×21): 2.5 mg via ORAL
  Filled 2018-04-02 (×22): qty 1

## 2018-04-02 NOTE — Discharge Instructions (Signed)
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
## 2018-04-02 NOTE — Progress Notes (Signed)
Patient transferred to bed from recliner, patient also request PRN medication for pain 8/10 in upper extremity

## 2018-04-02 NOTE — Progress Notes (Signed)
Rehab admissions - I have approval for acute inpatient rehab admission for today.  I spoke with Dr. Franky Macho and I have attending MD approval for acute inpatient rehab admission for today.  Bed available and will admit to inpatient rehab today.  Call me for questions.  #[phone]
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
## 2018-04-02 NOTE — Progress Notes (Signed)
Patient admitted to [phone]. Patient arrived to unit at approx. 1715 via bed accompanied by x 2 RNs and Friend. Patient is alert and oriented x 4. Skin assessment rendered x 2 RN's. Soft touch call bell provided. Patient has cell phone, cell phone charger, clothing and shoes at bedside.
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
## 2018-04-02 NOTE — Progress Notes (Signed)
Patient being transferred to [facility] , report given to [facility].

## 2018-04-03 ENCOUNTER — Inpatient Hospital Stay (HOSPITAL_COMMUNITY): Payer: 59

## 2018-04-03 ENCOUNTER — Inpatient Hospital Stay (HOSPITAL_COMMUNITY): Payer: 59 | Admitting: Physical Therapy

## 2018-04-03 ENCOUNTER — Inpatient Hospital Stay (HOSPITAL_COMMUNITY): Payer: 59 | Admitting: Occupational Therapy

## 2018-04-03 LAB — CBC WITH DIFFERENTIAL/PLATELET
Basophils Absolute: 0 10*3/uL (ref 0.0–0.1)
Basophils Relative: 0 %
Eosinophils Absolute: 0.1 10*3/uL (ref 0.0–0.7)
Eosinophils Relative: 1 %
HCT: 40.5 % (ref 36.0–46.0)
Hemoglobin: 13.8 g/dL (ref 12.0–15.0)
Lymphocytes Relative: 52 %
Lymphs Abs: 3.3 10*3/uL (ref 0.7–4.0)
MCH: 32.4 pg (ref 26.0–34.0)
MCHC: 34.1 g/dL (ref 30.0–36.0)
MCV: 95.1 fL (ref 78.0–100.0)
Monocytes Absolute: 0.5 10*3/uL (ref 0.1–1.0)
Monocytes Relative: 8 %
Neutro Abs: 2.5 10*3/uL (ref 1.7–7.7)
Neutrophils Relative %: 39 %
Platelets: 226 10*3/uL (ref 150–400)
RBC: 4.26 MIL/uL (ref 3.87–5.11)
RDW: 12.2 % (ref 11.5–15.5)
WBC: 6.5 10*3/uL (ref 4.0–10.5)

## 2018-04-03 LAB — COMPREHENSIVE METABOLIC PANEL
ALT: 13 U/L — ABNORMAL LOW (ref 14–54)
AST: 18 U/L (ref 15–41)
Albumin: 2.9 g/dL — ABNORMAL LOW (ref 3.5–5.0)
Alkaline Phosphatase: 26 U/L — ABNORMAL LOW (ref 38–126)
Anion gap: 7 (ref 5–15)
BUN: 12 mg/dL (ref 6–20)
CO2: 29 mmol/L (ref 22–32)
Calcium: 8.9 mg/dL (ref 8.9–10.3)
Chloride: 102 mmol/L (ref 101–111)
Creatinine, Ser: 0.78 mg/dL (ref 0.44–1.00)
GFR calc Af Amer: >60 mL/min (ref >60)
GFR calc non Af Amer: >60 mL/min (ref >60)
Glucose, Bld: 89 mg/dL (ref 65–99)
Potassium: 3.7 mmol/L (ref 3.5–5.1)
Sodium: 138 mmol/L (ref 135–145)
Total Bilirubin: 0.7 mg/dL (ref 0.3–1.2)
Total Protein: 5.6 g/dL — ABNORMAL LOW (ref 6.5–8.1)

## 2018-04-03 MED ORDER — BACLOFEN 10 MG PO TABS
10.0000 mg | ORAL_TABLET | Freq: Three times a day (TID) | ORAL | Status: DC | PRN
  Administered 2018-04-07 – 2018-04-23 (×19): 10 mg via ORAL
  Filled 2018-04-03 (×20): qty 1

## 2018-04-03 MED ORDER — GABAPENTIN 400 MG PO CAPS
400.0000 mg | ORAL_CAPSULE | Freq: Three times a day (TID) | ORAL | Status: DC
  Administered 2018-04-03 – 2018-04-05 (×6): 400 mg via ORAL
  Filled 2018-04-03 (×6): qty 1

## 2018-04-03 NOTE — Anesthesia Postprocedure Evaluation (Signed)
Anesthesia Post Note  Patient: Kelly Wall  Procedure(s) Performed: ANTERIOR CERVICAL DECOMPRESSION/DISCECTOMY FUSION Cervical four-five and Cervical five-six (N/A Neck)     Patient location during evaluation: Nursing Unit Anesthesia Type: General Level of consciousness: awake and alert Pain management: pain level controlled Vital Signs Assessment: post-procedure vital signs reviewed and stable Respiratory status: spontaneous breathing, nonlabored ventilation and respiratory function stable Cardiovascular status: blood pressure returned to baseline and stable Postop Assessment: no apparent nausea or vomiting Anesthetic complications: no    Last Vitals:  Vitals:   04/02/18 1248 04/02/18 1730  BP: 115/71 111/75  Pulse: 62 (!) 59  Resp: 20 18  Temp:  36.6 C  SpO2: 98% 100%    Last Pain:  Vitals:   04/02/18 1505  TempSrc:   PainSc: Asleep                 Enrique Weiss COKER
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
## 2018-04-03 NOTE — Progress Notes (Signed)
Occupational Therapy Note  Patient Details  Name: TAYONA SARNOWSKI MRN: [id number] Date of Birth: 1961/11/20  Today's Date: 04/03/2018 OT Individual Time: 1345-1430 OT Individual Time Calculation (min): 45 min   Pt c/o LUE pain; RN aware Individual Therapy  Pt resting in bed upon arrival.  OT intervention with focus on bed mobility, functional amb with HHA, toilet transfers, and toileting.  Discussed LTGs and estimated LOS. Pt required mod A for bed mobility and amb from bed to toilet. Pt assisted with pulling pants down and up but required tot A for toileting tasks.  Pt attempted to tear toilet paper from dispenser but lacked sufficient grasp to complete task.  Pt returned to bed with mod A.  Pt remained in bed with personal phone and soft call bell within reach.  Pt activated soft call bell.    Lavone Neri [facility] 04/03/2018, 2:55 PM
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
## 2018-04-03 NOTE — Progress Notes (Signed)
Physical Therapy Note  Patient Details  Name: Kelly Wall MRN: [id number] Date of Birth: 01/15/1961 Today's Date: 04/03/2018  [id number], 30 min individual tx Pain: 6/10 LUE and neck, premedicated  Pt stated she had just had pain meds.  She was supine in bed, tired from evals.  Supine therex: 15 x 1 each lower trunk rotation, bil hip internal rotation, 10 x 1 each active assistive bil hip abduction/adduction, R (active assistive) /L short arc quad knee extensions, ankle pumps.  Educated pt about avoiding Valsalva maneuver and counting aloud during exertion. Gentle PROM feet and heel cords bil.    Pt left resting in bed with alarm set and soft call bell at hand.  See function navigator for current status.  [name] 04/03/2018, 11:52 AM
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
## 2018-04-04 ENCOUNTER — Inpatient Hospital Stay (HOSPITAL_COMMUNITY): Payer: 59 | Admitting: Physical Therapy

## 2018-04-04 ENCOUNTER — Inpatient Hospital Stay (HOSPITAL_COMMUNITY): Payer: 59 | Admitting: Occupational Therapy

## 2018-04-04 ENCOUNTER — Ambulatory Visit (HOSPITAL_COMMUNITY): Payer: 59 | Attending: Physician Assistant

## 2018-04-04 ENCOUNTER — Inpatient Hospital Stay (HOSPITAL_COMMUNITY): Payer: 59

## 2018-04-04 DIAGNOSIS — M7989 Other specified soft tissue disorders: Secondary | ICD-10-CM | POA: Diagnosis not present

## 2018-04-04 MED ORDER — BISACODYL 10 MG RE SUPP
10.0000 mg | Freq: Every day | RECTAL | Status: DC | PRN
  Administered 2018-04-04 – 2018-04-06 (×2): 10 mg via RECTAL
  Filled 2018-04-04 (×2): qty 1

## 2018-04-04 MED ORDER — FLEET ENEMA 7-19 GM/118ML RE ENEM: 1.0000 | ENEMA | Freq: Every day | RECTAL | Status: DC | PRN

## 2018-04-04 NOTE — Progress Notes (Signed)
Bilateral lower extremity venous duplex completed. Preliminary results - There is no evidence of a DVT,superficial thrombosis, or Baker's cyst. Toma Deiters, RVS 04/04/2018 6:59 PM

## 2018-04-04 NOTE — Progress Notes (Signed)
Occupational Therapy Session Note  Patient Details  Name: Kelly Wall MRN: [id number] Date of Birth: Jul 22, 1961  Today's Date: 04/04/2018 OT Individual Time: 0930-1040 OT Individual Time Calculation (min): 70 min    Short Term Goals: Week 1:  OT Short Term Goal 1 (Week 1): Pt will be able to eat 75% of her meals with set up with AE. OT Short Term Goal 2 (Week 1): Pt will be able to hold a washcloth in her L hand to be able to cleanse her perineal area. OT Short Term Goal 3 (Week 1): Pt will be able to don a tshirt with mod A.   OT Short Term Goal 4 (Week 1): Pt will be able to sit to stand with min A in preparation for for clothing management with toileting.  OT Short Term Goal 5 (Week 1): Pt will be able to maintain stand balance with mod A during clothing management tasks.  Skilled Therapeutic Interventions/Progress Updates:      Pt seen for BADL retraining of toileting, bathing, and dressing with a focus on adaptive strategies and balance.  Pt received in bed and eager for a shower. She sat to EOB, stood and stood pivoted to w/c with mod A. Pt had improved control of pushing up through her legs.  Pt taken into bathroom with w/c and then competed stand pivot to toilet.  Due to limited hand strength total A with toileting tasks.  Ambulated 5 ft to shower mod A.  Sat on bench demonstrating improved trunk control from yesterday needing steadying A 50% of the time. Pt used a wash mit on L hand and then R hand to wash B arms, chest, abd, perineal area.   Pt transferred back to w/c to dress. Due to hand strength, she needs total A with dressing.  For grooming she used the wrist cuff with toothbrush for L half of mouth and then toothbrush needed to be flipped around so she could access R half of mouth.  Pt participated well with no fatigue. Pt taken to gym for her next PT session.  Therapy Documentation Precautions:  Precautions Precautions: Fall, Cervical Required Braces or Orthoses:  Cervical Brace Cervical Brace: Hard collar(donned in sitting) Restrictions Weight Bearing Restrictions: No    Pain: Pain Assessment Pain Score: 6  Pain Location: Arm Pain Orientation: Left;Right Pain Descriptors / Indicators: Throbbing Pain Onset: On-going ADL: ADL ADL Comments: refer to functional navigator  See Function Navigator for Current Functional Status.   Therapy/Group: Individual Therapy  Peri Kreft 04/04/2018, 11:00 AM

## 2018-04-04 NOTE — Progress Notes (Signed)
Social Work Social Work Assessment and Plan  Patient Details  Name: Kelly Wall MRN: [id number] Date of Birth: [birth year]  Today's Date: 04/03/2018  Problem List:  Patient Active Problem List   Diagnosis Date Noted  . Cervical myelopathy ([place]) 04/02/2018  . Bilateral arm weakness   . Numbness and tingling in both hands   . Paresthesia and pain of both upper extremities   . Trauma   . Post-operative pain   . Uncomplicated asthma   . Benign essential HTN   . Bradycardia   . Steroid-induced hyperglycemia   . 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 of cervical spinal cord ([place]) 03/30/2018  . S/P cervical spinal fusion 04/20/2016  . Cervical radicular pain 03/22/2016  . Hamstring tightness 12/11/2014  . 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 Pain in joint, upper arm 11/17/2014  . Labyrinthitis 05/15/2012  . Sinusitis acute 02/12/2012  . Well adult exam 06/27/2011  . Rash, skin 06/27/2011  . ADHESIVE CAPSULITIS, LEFT 11/09/2009  . SHOULDER PAIN 09/09/2009  . HAIR LOSS 07/24/2008  . ELEVATED BP 07/24/2008  . CARPAL TUNNEL SYNDROME 03/23/2008  . PARESTHESIA 03/23/2008  . MIGRAINE HEADACHE 08/02/2007   Past Medical History:  Past Medical History:  Diagnosis Date  . Asthma    child- occ exersie induced now  . Family history of adverse reaction to anesthesia    dad hard to awaken  . Hypertension    no rx  in 5 yrs fish oil helps now  . Medical history non-contributory    no anesthesia   Past Surgical History:  Past Surgical History:  Procedure Laterality Date  . ANTERIOR CERVICAL DECOMP/DISCECTOMY FUSION N/A 03/30/2018   Procedure: ANTERIOR CERVICAL DECOMPRESSION/DISCECTOMY FUSION Cervical four-five and Cervical five-six;  Surgeon: Ashok Pall, MD;  Location: [facility];  Service: Neurosurgery;  Laterality: N/A;  . CERVICAL DISC ARTHROPLASTY N/A 04/20/2016   Procedure: Cervical Six-Seven Artificial Disc Replacement-Cervical;  Surgeon: Eustace Moore, MD;  Location: [facility];  Service:  Neurosurgery;  Laterality: N/A;   Social History:  reports that she has never smoked. She has never used smokeless tobacco. She reports that she does not drink alcohol or use drugs.  Family / Support Systems Marital Status: Separated How Long?: 5 years Patient Roles: Parent, Other (Comment)(dtr; sister; sister-in-law; aunt) Children: Kelly Wall - dtr - [phone] Other Supports: Kelly Wall - brother - [phone]; Kelly Wall - friend - [phone] Anticipated Caregiver: Mom and family Ability/Limitations of Caregiver: Mom is 33 and does not work, but is on chemotherapy.  Brother and sister in law work nights; nephew is 34 and attends school and works PT. Caregiver Availability: 24/7 Family Dynamics: supportive family, but dtr lives in [place].  She's helping with pt's affairs from [place] in re: to [place].   Social History Preferred language: English Religion: Catholic Education: Pt has nursing and business degrees from the [place].  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 Legal History/Current Legal Issues: none reported Guardian/Conservator: N/A - MD has determined that pt is capable of making her own decisions.   Abuse/Neglect Abuse/Neglect Assessment Can Be Completed: Yes Physical Abuse: Denies Verbal Abuse: Denies Sexual Abuse: Denies Exploitation of patient/patient's resources: Denies Self-Neglect: Denies  Emotional  Status Pt's affect, behavior and adjustment status: Pt was emotional and tearful at times, but is overall positive and motivated about her recovery.  She knows she will recover. Recent Psychosocial Issues: Pt separated from her husband 5 years okay, which has been a positive thing  overall.  She has also experienced multiple losses of family members with whom she is close.  She copes well with these life events, however.   Psychiatric History: none reported Substance Abuse History: none reported  Patient / Family Perceptions, Expectations & Goals Pt/Family understanding of illness & functional limitations: Pt has a good understanding of her condition and current limitations. Premorbid pt/family roles/activities: Pt enjoys going to the gym and working out, bike riding, and fundraising for her annual mission trip. Anticipated changes in roles/activities/participation: Pt would like to resume activities as soon as she is physically able to. Pt/family expectations/goals: Pt wants to be able to use her arms again and to get back to bike riding.  Community Resources Express Scripts: None Premorbid Home Care/DME Agencies: None Transportation available at discharge: family Resource referrals recommended: Neuropsychology  Discharge Planning Living Arrangements: Alone Support Systems: Children, Armed forces technical officer, Other relatives, Friends/neighbors Type of Residence: Private residence Insurance Resources: Multimedia programmer (specify)([facility]) Financial Resources: Employment Museum/gallery curator Screen Referred: No Living Expenses: Education officer, community Management: Patient Does the patient have any problems obtaining your medications?: No Home Management: Pt was doing this, but she will be staying with her family and they will take care of the home. Patient/Family Preliminary Plans: Pt plans to go to her mother's for a while to recover. Social Work Anticipated Follow Up Needs: HH/OP Expected length of stay: 24 to 28 days  Clinical Impression CSW met with pt to introduce self and role of CSW, as well as to complete assessment.  Pt was open and talkative with CSW and very appreciative of rehab program.  She is motivated to get better, understandably frustrated and sad that this happened, acknowledging that she  is responsible.  She has a very calm, positive outlook on life and deals with things as they come.  Pt has good support from family and friends.  Her dtr is assisting with HR affairs from [place] and family in [place] is going to care for pt at their home after d/c.  CSW offered support and will continue to follow to assist as needed.  Ellamarie Naeve, Silvestre Mesi 04/04/2018, 11:54 AM

## 2018-04-04 NOTE — Plan of Care (Signed)
  Problem: RH SAFETY Goal: RH STG ADHERE TO SAFETY PRECAUTIONS W/ASSISTANCE/DEVICE Description STG Adhere to Safety Precautions With Assistance/Device Supervision.  Outcome: Progressing  Call bell is within reach, bed in low position, room clutter free.

## 2018-04-04 NOTE — Progress Notes (Signed)
Physical Therapy Note  Patient Details  Name: Kelly Wall MRN: [id number] Date of Birth: July 23, 1961 Today's Date: 04/04/2018  1100-1200, 60 min individual tx Pain: " a litle bit" my neck, surgical, and LUE neurogenic pain, premedicated  Seated neuromuscular re-education via multimodal cues and mirror feedback, for R/L hip flexion, knee extension, heel raises, toe raises, bil hip adduction against bolster, bil glut sets, bil shoulder elevation while holding large beach ball with forearms.  PT added 1/2 lap tray on L to elevate LUE, and Jay back to support trunk, which pt said was more comfortable.  W/c rims wrapped with Theratubing for increased friction on bil hands for propulsion.  W/c propulsion over level tile using bil UEs with supervision, 50' , x 170'.  Pt instructed in efficiency, turning techniques.  She is able to reach locks with min assist, but needs mod assist to lock them.   Therapeutic activities seated in w/c:  reciprocal scooting forward/backward for core activation, and  reaching forward with bil hands in front,  focusing on trunk flexion with straight spine.  Pt left resting in w/c with soft call bell in lap, friend in room.  See function navigator for current status.  Jauan Wohl 04/04/2018, 8:58 AM
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
## 2018-04-04 NOTE — Progress Notes (Signed)
Physical Therapy Session Note  Patient Details  Name: Kelly Wall MRN: [id number] Date of Birth: August 29, 1961  Today's Date: 04/04/2018 PT Concurrent Time: 1030-1100 PT Concurrent Time Calculation (min): 30 min  Short Term Goals: Week 1:  PT Short Term Goal 1 (Week 1): Pt will utilize UE hooking to roll in bed with supervision to increase independence with pressure relief.  PT Short Term Goal 2 (Week 1): Pt will verbalize and demonstrate 2 methods of pressure relief while in w/c.  PT Short Term Goal 3 (Week 1): Pt will initiate w/c propulsion for cardiovascular endurance and UE strengthening PT Short Term Goal 4 (Week 1): Pt will ambulate with normal base of support 50% of observations  Skilled Therapeutic Interventions/Progress Updates:    handoff from OT in therapy gym.  No c/o pain.  Session focus on NMR and coordination via ambulation.  Pt ambulated 2x70' with therapist in front, pt resting UEs on therapist's arms.  Pt with improved BOS today, stepping on her own feet in less than 10% of opportunities.  PT applied 1.5# ankle weights bilaterally for improved feedback with further improved base of support and LE coordination.  Handoff to next PT in therapy gym.    Therapy Documentation Precautions:  Precautions Precautions: Fall, Cervical Required Braces or Orthoses: Cervical Brace Cervical Brace: Hard collar(donned in sitting) Restrictions Weight Bearing Restrictions: No   See Function Navigator for Current Functional Status.   Therapy/Group: Individual Therapy  Stephania Fragmin 04/04/2018, 11:09 AM
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
## 2018-04-04 NOTE — Progress Notes (Signed)
[facility] Individual Statement of Services  Patient Name:  Kelly Wall  Date:  04/04/2018  Welcome to the Inpatient Rehabilitation [facility].  Our goal is to provide you with an individualized program based on your diagnosis and situation, designed to meet your specific needs.  With this comprehensive rehabilitation program, you will be expected to participate in at least 3 hours of rehabilitation therapies Monday-Friday, with modified therapy programming on the weekends.  Your rehabilitation program will include the following services:  Physical Therapy (PT), Occupational Therapy (OT), 24 hour per day rehabilitation nursing, Case Management (Social Worker), Rehabilitation Medicine, Nutrition Services and Pharmacy Services  Weekly team conferences will be held on Tuesdays to discuss your progress.  Your Social Worker will talk with you frequently to get your input and to update you on team discussions.  Team conferences with you and your family in attendance may also be held.  Expected length of stay:  24 to 28 days  Overall anticipated outcome:  Supervision with minimal assistance for stairs  Depending on your progress and recovery, your program may change. Your Social Worker will coordinate services and will keep you informed of any changes. Your Social Worker's name and contact numbers are listed  below.  The following services may also be recommended but are not provided by the Inpatient Rehabilitation Center:   Driving Evaluations  Home Health Rehabiltiation Services  Outpatient Rehabilitation Services  Vocational Rehabilitation   Arrangements will be made to provide these services after discharge if needed.  Arrangements include referral to agencies that provide these services.  Your insurance has been verified to be:  [employer] Your primary doctor is:  Dr. Macarthur Critchley Plotnikov  Pertinent information will be shared with your doctor and your insurance  company.  Social Worker:  Staci Acosta, LCSW  [phone] or [phone]  Information discussed with and copy given to patient by: Elvera Lennox, 04/04/2018, 9:27 AM

## 2018-04-05 ENCOUNTER — Inpatient Hospital Stay (HOSPITAL_COMMUNITY): Payer: 59

## 2018-04-05 ENCOUNTER — Inpatient Hospital Stay (HOSPITAL_COMMUNITY): Payer: 59 | Admitting: Physical Therapy

## 2018-04-05 ENCOUNTER — Inpatient Hospital Stay (HOSPITAL_COMMUNITY): Payer: 59 | Admitting: Occupational Therapy

## 2018-04-05 MED ORDER — GABAPENTIN 600 MG PO TABS
600.0000 mg | ORAL_TABLET | Freq: Three times a day (TID) | ORAL | Status: DC
  Administered 2018-04-05 – 2018-04-09 (×12): 600 mg via ORAL
  Filled 2018-04-05 (×12): qty 1

## 2018-04-05 MED ORDER — BETHANECHOL CHLORIDE 10 MG PO TABS
10.0000 mg | ORAL_TABLET | Freq: Three times a day (TID) | ORAL | Status: DC
  Administered 2018-04-05 (×3): 10 mg via ORAL
  Filled 2018-04-05 (×3): qty 1

## 2018-04-05 NOTE — Progress Notes (Signed)
Occupational Therapy Note  Patient Details  Name: TYREONA PANJWANI MRN: [id number] Date of Birth: 1961-04-21  Today's Date: 04/05/2018 OT Individual Time: 1300-1330 OT Individual Time Calculation (min): 30 min   Pt c/o LUE neuropathic pain (unrated but constant); RN aware Individual Therapy  OT intervention with focus on BUE AROM and strengthening.  Pt engaged in table tasks with focus on wrist flexion/extension and finger flexion/extension-R hand with greater AROM/strength.  Pt also engaged in elbow/shoulder flexion/extenstion tasks without wrist weights and with 1# weights. Pt returned to room and remained in w/c with all needs within reach.  Pt demonstrated ability to activate soft call bell.    Lavone Neri [facility] 04/05/2018, 2:41 PM

## 2018-04-05 NOTE — IPOC Note (Signed)
Overall Plan of Care [facility]) Patient Details Name: Kelly Wall MRN: [id number] DOB: Oct 13, 1961  Admitting Diagnosis: <principal problem not specified>cervical spinal cord injury  Hospital Problems: Active Problems:   Cervical myelopathy [facility])     Functional Problem List: Nursing Bladder, Bowel, Edema, Endurance, Medication Management, Motor, Pain, Sensory, Skin Integrity  PT Balance, Endurance, Pain, Motor, Safety  OT Balance, Motor, Pain, Sensory  SLP    TR         Basic ADL's: OT Eating, Grooming, Bathing, Dressing, Toileting     Advanced  ADL's: OT       Transfers: PT Bed Mobility, Furniture, Bed to Chair, Floor, Customer service manager, Research scientist (life sciences): PT Ambulation, Psychologist, prison and probation services, Stairs     Additional Impairments: OT Fuctional Use of Upper Extremity  SLP        TR      Anticipated Outcomes Item Anticipated Outcome  Self Feeding mod I  Swallowing      Basic self-care  mod I grooming, supervision bathing and dressing  Toileting  supervision   Bathroom Transfers supervision  Bowel/Bladder  Patient will manage Bowel/Bladder with Minimal assist at Discharge.   Transfers  supervision  Locomotion  supervision with LRAD, min on stairs  Communication     Cognition     Pain  Patient will manage pain at  3=< on a scale of 0-10.   Safety/Judgment  Patient will remain free of falls without injury with Minimum assist and cues.    Therapy Plan: PT Intensity: Minimum of 1-2 x/day ,45 to 90 minutes PT Frequency: 5 out of 7 days PT Duration Estimated Length of Stay: 24-28 days OT Intensity: Minimum of 1-2 x/day, 45 to 90 minutes OT Frequency: 5 out of 7 days OT Duration/Estimated Length of Stay: 26-28 days      Team Interventions: Nursing Interventions Patient/Family Education, Bladder Management, Bowel Management, Pain Management, Medication Management, Skin Care/Wound Management, Discharge Planning  PT interventions Ambulation/gait  training, Balance/vestibular training, Cognitive remediation/compensation, Community reintegration, Discharge planning, DME/adaptive equipment instruction, Functional electrical stimulation, Functional mobility training, Neuromuscular re-education, Pain management, Patient/family education, Psychosocial support, Splinting/orthotics, Stair training, Therapeutic Activities, Therapeutic Exercise, UE/LE Strength taining/ROM, UE/LE Coordination activities, Wheelchair propulsion/positioning  OT Interventions Warden/ranger, Discharge planning, DME/adaptive equipment instruction, Functional mobility training, Community reintegration, Neuromuscular re-education, Pain management, Psychosocial support, Patient/family education, Self Care/advanced ADL retraining, Therapeutic Activities, UE/LE Strength taining/ROM, UE/LE Coordination activities, Therapeutic Exercise  SLP Interventions    TR Interventions    SW/CM Interventions Discharge Planning, Psychosocial Support, Patient/Family Education   Barriers to Discharge MD  Medical stability  Nursing      PT      OT      SLP      SW       Team Discharge Planning: Destination: PT-Home ,OT- Home , SLP-  Projected Follow-up: PT-Outpatient PT, 24 hour supervision/assistance, OT-  Outpatient OT, SLP-  Projected Equipment Needs: PT-To be determined, OT- Tub/shower seat, SLP-  Equipment Details: PT- , OT-  Patient/family involved in discharge planning: PT- Patient,  OT-Patient, SLP-   MD ELOS: 24-28 days Medical Rehab Prognosis:  Excellent Assessment: The patient has been admitted for CIR therapies with the diagnosis of cervical myelopathy with central cord picture. The team will be addressing functional mobility, strength, stamina, balance, safety, adaptive techniques and equipment, self-care, bowel and bladder mgt, patient and caregiver education, NMR, pain mgt, ortho precautions. Goals have been set at supervision for mobility and self-care. Pt  is very motivated   Ranelle Oyster, MD, [facility]      See Team Conference Notes for weekly updates to the plan of care

## 2018-04-05 NOTE — Progress Notes (Signed)
Occupational Therapy Session Note  Patient Details  Name: Kelly Wall MRN: [id number] Date of Birth: February 10, 1961  Today's Date: 04/05/2018 OT Individual Time: 1045-1200 OT Individual Time Calculation (min): 75 min    Short Term Goals: Week 1:  OT Short Term Goal 1 (Week 1): Pt will be able to eat 75% of her meals with set up with AE. OT Short Term Goal 2 (Week 1): Pt will be able to hold a washcloth in her L hand to be able to cleanse her perineal area. OT Short Term Goal 3 (Week 1): Pt will be able to don a tshirt with mod A.   OT Short Term Goal 4 (Week 1): Pt will be able to sit to stand with min A in preparation for for clothing management with toileting.  OT Short Term Goal 5 (Week 1): Pt will be able to maintain stand balance with mod A during clothing management tasks.  Skilled Therapeutic Interventions/Progress Updates:    Pt seen this session with a focus on developing UE AROM and strength. Pt received in w/c stating she did not want to bathe or shower today but did want to change shirts into a more comfortable V neck.  Pt already had pants on.  Pt doffed and donned shirt with max A with HOH guiding to facilitate grasp and reach.  Pt used BUE to push w/c 50% of the way to the gym.  In the gym problem solved adaptive methods for pt to be able to text/type on her cell phone.  Pt does not have the finger dexterity to type.  Adapted a tennis ball to place stylus into ball. Pt needs to use wrist support splint as she does not have adequate wrist ext to hold the ball. With the splint she can hold tennis ball and use stylus to type on phone.   Pt worked on A/arom exercises for BUE with a focus on sh flex, elbow extension, wrist ext and finger flex/ ext.  Pt continues to have hypersensitivity to touch on B hands.  Pt provided with soft yellow foam block for finger flexion strength.  Pt propelled w/c 80% of the way back to her room.  Pt set up in room with lap belt and call light in reach.  Pt's friend present to set her up with her meal.    Therapy Documentation Precautions:  Precautions Precautions: Fall, Cervical Required Braces or Orthoses: Cervical Brace Cervical Brace: Hard collar(donned in sitting) Restrictions Weight Bearing Restrictions: No  Pain: Pain Assessment Pain Scale: 0-10 Pain Score: 7  Pain Type: Surgical pain Pain Location: Neck Pain Orientation: Right Pain Descriptors / Indicators: Aching Pain Frequency: Intermittent Pain Onset: Gradual Patients Stated Pain Goal: 4 Pain Intervention(s): Medication (See eMAR) ADL: ADL ADL Comments: refer to functional navigator  See Function Navigator for Current Functional Status.   Therapy/Group: Individual Therapy  Ginni Eichler 04/05/2018, 12:05 PM

## 2018-04-05 NOTE — Progress Notes (Signed)
Physical Therapy Session Note  Patient Details  Name: Kelly Wall MRN: [id number] Date of Birth: Mar 18, 1961  Today's Date: 04/05/2018 PT Individual Time: 1415-1530 PT Individual Time Calculation (min): 75 min   Short Term Goals: Week 1:  PT Short Term Goal 1 (Week 1): Pt will utilize UE hooking to roll in bed with supervision to increase independence with pressure relief.  PT Short Term Goal 2 (Week 1): Pt will verbalize and demonstrate 2 methods of pressure relief while in w/c.  PT Short Term Goal 3 (Week 1): Pt will initiate w/c propulsion for cardiovascular endurance and UE strengthening PT Short Term Goal 4 (Week 1): Pt will ambulate with normal base of support 50% of observations  Skilled Therapeutic Interventions/Progress Updates:    no c/o pain at rest. Session focus on activity tolerance, gait training/NMR, w/c propulsion, and UE NMR.   Pt propelled w/c to therapy gym with BUEs for mobility and cardiovascular endurance.  PT applied theraband under theratubing for improved grip and energy conservation with w/c propulsion.  Pt completes bicep curls 3 trials to fatigue with .75# wrist weights while PT adjusts w/c.  Gait training x100' with RW and min assist.  Pt demonstrates improved BOS and LE motor control/coordination this session.  Dynamic sitting balance and UE task ball toss focus on using BUE to catch/throw and balance reaching outside BOS.  Progress from firm mat to sitting on dynadisk without LE support for maximum challenge. BUE fine motor task with pill box and various sized beads for NMR and coordination.  Pt states this final task was the most challenging.  Encouraged to continue practicing with her peanuts and blueberries in the room but to allow time for hands to rest.  Returned to room at end of session, call bell in reach and needs met.   Therapy Documentation Precautions:  Precautions Precautions: Fall, Cervical Required Braces or Orthoses: Cervical  Brace Cervical Brace: Hard collar(donned in sitting) Restrictions Weight Bearing Restrictions: No   See Function Navigator for Current Functional Status.   Therapy/Group: Individual Therapy  Michel Santee 04/05/2018, 4:41 PM
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
## 2018-04-05 NOTE — Progress Notes (Signed)
Physical Therapy Session Note  Patient Details  Name: Kelly Wall MRN: [id number] Date of Birth: 1961-01-23  Today's Date: 04/05/2018 PT Individual Time: 0930-1000 PT Individual Time Calculation (min): 30 min   Short Term Goals: Week 1:  PT Short Term Goal 1 (Week 1): Pt will utilize UE hooking to roll in bed with supervision to increase independence with pressure relief.  PT Short Term Goal 2 (Week 1): Pt will verbalize and demonstrate 2 methods of pressure relief while in w/c.  PT Short Term Goal 3 (Week 1): Pt will initiate w/c propulsion for cardiovascular endurance and UE strengthening PT Short Term Goal 4 (Week 1): Pt will ambulate with normal base of support 50% of observations  Skilled Therapeutic Interventions/Progress Updates:    Donned cervical brace prior to OOB. Education and discussion in regards to overall progress, goals, rehab process, and mental processing of injury and ways to increase functional independence. Discussed problem solving ways to increase independence with manipulation of phone and will work with OT as well. Pt able to come to EOB with supervision using rail for support. Min assist functionally throughout session for sit <> stands with facilitation for anterior weightshift and min for balance including for OOB transfer, w/c <> toilet transfers, and then in therapy gym. Pt unable to manipulate pants (tight yoga pants) with UE"s for clothing management, but pt able to perform hygiene with min assist for balance in standing. NMR in parallel bars for gait forwards and backwards x 3 reps with visual feedback with line on floor to maintain neutral BOS with focus on coordination and control of foot placement with overall min assist.   Therapy Documentation Precautions:  Precautions Precautions: Fall, Cervical Required Braces or Orthoses: Cervical Brace Cervical Brace: Hard collar(donned in sitting) Restrictions Weight Bearing Restrictions: No   Pain: Pain  Assessment Pain Scale: 0-10 Pain Score: 7  Pain Type: Surgical pain Pain Location: Neck Pain Orientation: Right Pain Descriptors / Indicators: Aching Pain Frequency: Intermittent Pain Onset: Gradual Patients Stated Pain Goal: 4 Pain Intervention(s): Medication (See eMAR)   See Function Navigator for Current Functional Status.   Therapy/Group: Individual Therapy  Karolee Stamps Darrol Poke, PT, DPT  04/05/2018, 10:38 AM

## 2018-04-06 ENCOUNTER — Inpatient Hospital Stay (HOSPITAL_COMMUNITY): Payer: 59 | Admitting: Occupational Therapy

## 2018-04-06 ENCOUNTER — Inpatient Hospital Stay (HOSPITAL_COMMUNITY): Payer: 59 | Admitting: Physical Therapy

## 2018-04-06 MED ORDER — BETHANECHOL CHLORIDE 25 MG PO TABS
25.0000 mg | ORAL_TABLET | Freq: Three times a day (TID) | ORAL | Status: DC
  Administered 2018-04-06 – 2018-04-15 (×27): 25 mg via ORAL
  Filled 2018-04-06 (×27): qty 1

## 2018-04-06 MED ORDER — BETHANECHOL CHLORIDE 25 MG PO TABS: 25.0000 mg | ORAL_TABLET | Freq: Three times a day (TID) | ORAL | Status: DC
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
## 2018-04-06 NOTE — Progress Notes (Signed)
Physical Therapy Session Note  Patient Details  Name: Kelly Wall MRN: [id number] Date of Birth: 03-12-1961  Today's Date: 04/06/2018 PT Individual Time: 0800-0910 AND 1600-1655 PT Individual Time Calculation (min): 70 min AND 55 min  Short Term Goals: Week 1:  PT Short Term Goal 1 (Week 1): Pt will utilize UE hooking to roll in bed with supervision to increase independence with pressure relief.  PT Short Term Goal 2 (Week 1): Pt will verbalize and demonstrate 2 methods of pressure relief while in w/c.  PT Short Term Goal 3 (Week 1): Pt will initiate w/c propulsion for cardiovascular endurance and UE strengthening PT Short Term Goal 4 (Week 1): Pt will ambulate with normal base of support 50% of observations  Skilled Therapeutic Interventions/Progress Updates:   Session 1: Pt in supine and agreeable to therapy, pain as detailed below. Donned cervical collar total assist in sitting. Transferred to w/c via stand pivot w/ min assist. Pt self-propelled w/c to/from therapy gym for endurance and independence w/ locomotion. Session focused on gait and LE NMR. Ambulated 100' w/ RW and min guard, 50' x2 w/o AD and min guard w/ occasional mod assist to correct LOB. Verbal cues for gait pattern w/o AD, to increase BOS and for increased hip and knee flexion in swing. Performed LE NMR in stance w/o UE support. Performed static balance while stacking cups w/ UEs and partial knee bends 3x10 w/ close supervision. Verbal cues for slow eccentric control. Additionally performed  Returned to room and ended session in w/c, call bell within reach and all needs met.   Session 2:  Pt in supine and agreeable to therapy, denies pain. Total assist to don cervical collar. Ambulated to toilet w/ min guard using RW. Total assist for LE garment management 2/2 decreased fine motor skills in fingers. Continent void, NT present and aware. Ambulated to/from day room w/ min guard fading to close supervision. Pt required  decreased verbal cues for BOS in this session compared to this morning. Very slow gait speed. Performed NuStep 15 min @ level 3 utilizing all extremities for strengthening in reciprocal movement pattern and facilitating use of UEs w/ mobility. Returned to room and ended session in w/c, call bell within reach and all needs met.   Therapy Documentation Precautions:  Precautions Precautions: Fall, Cervical Required Braces or Orthoses: Cervical Brace Cervical Brace: Hard collar(donned in sitting) Restrictions Weight Bearing Restrictions: No Vital Signs: Therapy Vitals Temp: 97.7 F (36.5 C) Temp Source: Oral Pulse Rate: (!) 52 Resp: 16 BP: 107/62 Patient Position (if appropriate): Lying Oxygen Therapy SpO2: 100 % O2 Device: Room Air Pain: Pain Assessment Pain Scale: 0-10 Pain Score: 4  Pain Type: Acute pain Pain Location: Arm Pain Orientation: Left Pain Descriptors / Indicators: Aching Pain Frequency: Intermittent Pain Onset: On-going Patients Stated Pain Goal: 3 2nd Pain Site Pain Score: 8 Pain Type: Surgical pain Pain Location: Neck Pain Orientation: Anterior Pain Descriptors / Indicators: Aching Pain Frequency: Intermittent Pain Onset: On-going Patient's Stated Pain Goal: 3 Pain Intervention(s): Medication (See eMAR)  See Function Navigator for Current Functional Status.   Therapy/Group: Individual Therapy  Shailyn Weyandt K Arnette 04/06/2018, 9:13 AM

## 2018-04-06 NOTE — Progress Notes (Signed)
Occupational Therapy Session Note  Patient Details  Name: Kelly Wall MRN: [id number] Date of Birth: 1960-12-01  Today's Date: 04/06/2018 OT Individual Time: 1320-1400 OT Individual Time Calculation (min): 40 min   Short Term Goals: Week 1:  OT Short Term Goal 1 (Week 1): Pt will be able to eat 75% of her meals with set up with AE. OT Short Term Goal 2 (Week 1): Pt will be able to hold a washcloth in her L hand to be able to cleanse her perineal area. OT Short Term Goal 3 (Week 1): Pt will be able to don a tshirt with mod A.   OT Short Term Goal 4 (Week 1): Pt will be able to sit to stand with min A in preparation for for clothing management with toileting.  OT Short Term Goal 5 (Week 1): Pt will be able to maintain stand balance with mod A during clothing management tasks.  Skilled Therapeutic Interventions/Progress Updates:    Pt initially greeted on toilet, requesting OT to retrieve RN for suppository. 20 minutes missed at start of session for suppository/depacking of BM. Afterwards, educated pt on "potty squat" toilet positioning and rocking techniques for continuing BM. Pt with increased success in squat stand (and steady assist)! Once she passed a large BM, pt reported: "I feel like a million dollars. That's worse than childbirth." Stand pivot<w/c with Min A. Worked on UE NMR/desensitization via handwashing, oral care (using AE), and UB dressing for remainder of session. Pt was left in w/c with all needs and visitor present at session exit.    Therapy Documentation Precautions:  Precautions Precautions: Fall, Cervical Required Braces or Orthoses: Cervical Brace Cervical Brace: Hard collar(donned in sitting) Restrictions Weight Bearing Restrictions: No Vital Signs: Therapy Vitals Temp: 98.1 F (36.7 C) Pulse Rate: (!) 53 Resp: 16 BP: 106/65 Patient Position (if appropriate): Sitting Oxygen Therapy SpO2: 100 % O2 Device: Room Air Pain: Pain Assessment Pain Scale:  0-10 Pain Score: 6  Pain Type: Acute pain;Surgical pain Pain Location: Arm Pain Orientation: Left Pain Descriptors / Indicators: Aching Pain Frequency: Constant Pain Onset: On-going Pain Intervention(s): Medication (See eMAR) ADL: ADL ADL Comments: refer to functional navigator     See Function Navigator for Current Functional Status.   Therapy/Group: Individual Therapy  Kelly Wall A Kelly Wall 04/06/2018, 3:46 PM

## 2018-04-07 ENCOUNTER — Inpatient Hospital Stay (HOSPITAL_COMMUNITY): Payer: 59 | Admitting: Occupational Therapy

## 2018-04-07 NOTE — Progress Notes (Signed)
Occupational Therapy Session Note  Patient Details  Name: Kelly Wall MRN: [id number] Date of Birth: 1961-01-13  Today's Date: 04/07/2018 OT Individual Time: 0900-1000 OT Individual Time Calculation (min): 60 min    Short Term Goals: Week 1:  OT Short Term Goal 1 (Week 1): Pt will be able to eat 75% of her meals with set up with AE. OT Short Term Goal 2 (Week 1): Pt will be able to hold a washcloth in her L hand to be able to cleanse her perineal area. OT Short Term Goal 3 (Week 1): Pt will be able to don a tshirt with mod A.   OT Short Term Goal 4 (Week 1): Pt will be able to sit to stand with min A in preparation for for clothing management with toileting.  OT Short Term Goal 5 (Week 1): Pt will be able to maintain stand balance with mod A during clothing management tasks.  Skilled Therapeutic Interventions/Progress Updates:    Pt seen for OT ADL bathing/dressing session. Pt sitting up in supine upon arrival, voicing having already received pain meds and agreeable to tx session.  She ambulated throughout session and completed functional transfers with steadying assist, demonstrating good functional gross grasp on RW during functional management of AD. She completed toileting task with assist for clothing management due to weak fine motor skills in order to obtain functional grasp on clothes. She doffed clothing and bathed seated on tub transfer bench with cuing for problem solving various methods and positioning for increased independence with dressing and bathing tasks. [employer] used throughout bathing task.  She returned to w/c to dress, able to manage shirt and thread B UEs and requiring assist to pull down over chest. She stood with steadying assist to pull skirt up. Socks/shoes donned total A for time and energy conservation. Hand over hand assist provided for grooming tasks including brushing hair and applying makeup. Pt left seated in w/c at end of session, QRB donned and all  needs in reach. Pt asking permission to wheel self throughout unit, provided permission at w/c level only and to stay on unit, she voiced understanding.   Therapy Documentation Precautions:  Precautions Precautions: Fall, Cervical Required Braces or Orthoses: Cervical Brace Cervical Brace: Hard collar(donned in sitting) Restrictions Weight Bearing Restrictions: No Pain: Pain Assessment Pain Scale: 0-10 Pain Score: 9  Pain Type: Surgical pain;Acute pain Pain Location: Arm Pain Orientation: Left Pain Descriptors / Indicators: Aching;Burning Pain Frequency: Constant Pain Onset: On-going Pain Intervention(s): Pt reports being pre-medicated prior to tx session, RN aware upon arrival, Shower and repositioned for comfort ADL: ADL ADL Comments: refer to functional navigator  See Function Navigator for Current Functional Status.   Therapy/Group: Individual Therapy  Zilah Villaflor L 04/07/2018, 6:47 AM
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
## 2018-04-07 NOTE — Progress Notes (Signed)
Up to BR at HS, voided continently. Mainly complains of burning pain to LUE  and reports LUE sensitive to touch. At 0330, up to BR to void, bladder scan=411 cc's. Patient reports bladder feeling full, I & O cath=500cc's. PRN Oxy IR given at 1925 and 0350, for LUE pain. Alfredo Martinez A

## 2018-04-08 ENCOUNTER — Inpatient Hospital Stay (HOSPITAL_COMMUNITY): Payer: 59 | Admitting: Occupational Therapy

## 2018-04-08 ENCOUNTER — Encounter (HOSPITAL_COMMUNITY): Payer: 59 | Admitting: Psychology

## 2018-04-08 ENCOUNTER — Inpatient Hospital Stay (HOSPITAL_COMMUNITY): Payer: 59 | Admitting: Physical Therapy

## 2018-04-08 NOTE — Progress Notes (Signed)
Physical Therapy Session Note  Patient Details  Name: Kelly Wall MRN: [id number] Date of Birth: May 01, 1961  Today's Date: 04/08/2018 PT Individual Time: 1415-1445 PT Individual Time Calculation (min): 30 min   Short Term Goals: Week 1:  PT Short Term Goal 1 (Week 1): Pt will utilize UE hooking to roll in bed with supervision to increase independence with pressure relief.  PT Short Term Goal 2 (Week 1): Pt will verbalize and demonstrate 2 methods of pressure relief while in w/c.  PT Short Term Goal 3 (Week 1): Pt will initiate w/c propulsion for cardiovascular endurance and UE strengthening PT Short Term Goal 4 (Week 1): Pt will ambulate with normal base of support 50% of observations  Skilled Therapeutic Interventions/Progress Updates: Pt received seated in w/c with friend present; reports she just went outside for the first time and "feels wonderful". Gait to gym with RW and min guard/close S with observed R foot drag, occasional scissoring and trendelenburg gait R>L. Discussed with primary PT and trial'ed RLE foot-up AFO for normalized gait mechanics to improve foot clearance; observe improved gait speed and reduced foot drag with foot-up AFO on however incorrect size for the patient and would recommend trial with correct size. Side stepping and backwards walking with hall rail for glute med/max strengthening and LE coordination. Returned to room with modI w/c propulsion at end of session.      Therapy Documentation Precautions:  Precautions Precautions: Fall, Cervical Required Braces or Orthoses: Cervical Brace Cervical Brace: Hard collar(donned in sitting) Restrictions Weight Bearing Restrictions: No  See Function Navigator for Current Functional Status.   Therapy/Group: Individual Therapy  Vista Lawman 04/08/2018, 3:46 PM

## 2018-04-08 NOTE — Progress Notes (Signed)
Occupational Therapy Session Note  Patient Details  Name: Kelly Wall MRN: [id number] Date of Birth: 1961/11/20  Today's Date: 04/08/2018 OT Individual Time: 1445-1550 OT Individual Time Calculation (min): 65 min   Short Term Goals: Week 1:  OT Short Term Goal 1 (Week 1): Pt will be able to eat 75% of her meals with set up with AE. OT Short Term Goal 2 (Week 1): Pt will be able to hold a washcloth in her L hand to be able to cleanse her perineal area. OT Short Term Goal 3 (Week 1): Pt will be able to don a tshirt with mod A.   OT Short Term Goal 4 (Week 1): Pt will be able to sit to stand with min A in preparation for for clothing management with toileting.  OT Short Term Goal 5 (Week 1): Pt will be able to maintain stand balance with mod A during clothing management tasks.  Skilled Therapeutic Interventions/Progress Updates:    Pt greeted in w/c. Ready to go. Tx focus on UE AROM/coordination, gross grasp strengthening, and activity tolerance. She self propelled to dayroom and OT taught her how to use Wii. Pt participated in tennis, then activity upgraded to boxing for bilateral UE involvement. Pt dropping controllers 50% of time but able to retrieve from lap. She appeared engrossed in games and motivated to meet task demands. Afterwards pt self propelled to room and completed toilet transfer with steady assist via stand pivot. With RN and PT consultation, OT added pink foam pad under neck brace to relief pressure and decrease pain. Per RN, she will follow up with ortho to see if they can modify brace to increase comfort for pt. Pt left seated on toilet trying to void bladder. Pt verbalized that she would call for staff assist for transfer off of toilet when she was done. Made up 20 minutes missed therapy time during session.    Therapy Documentation Precautions:  Precautions Precautions: Fall, Cervical Required Braces or Orthoses: Cervical Brace Cervical Brace: Hard collar(donned in  sitting) Restrictions Weight Bearing Restrictions: No Vital Signs: Therapy Vitals Temp: 97.6 F (36.4 C) Temp Source: Oral Pulse Rate: (!) 59 Resp: 18 BP: (!) 97/59 Patient Position (if appropriate): Sitting Oxygen Therapy SpO2: 99 % O2 Device: Room Air ADL: ADL ADL Comments: refer to functional navigator     See Function Navigator for Current Functional Status.   Therapy/Group: Individual Therapy  Blondie Riggsbee A Zamaria Brazzle 04/08/2018, 4:11 PM

## 2018-04-08 NOTE — Plan of Care (Signed)
Pt occasional I&O cath

## 2018-04-08 NOTE — Progress Notes (Signed)
At 0115 up to void, PVR 372, I & O cath=400. At 0530 up to void, bladder scan=177cc's, no cath needed at this time. LBM 5/11 after disimpaction and supp. ? Add supp. Daily? BM x 2 daily per patient.  Patient reports pain to LUE better last night. PRN Oxy IR given at 0125. PRN baclofen given at 0530, for complaint of spasms to BLE's. Alfredo Martinez A
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
## 2018-04-08 NOTE — Progress Notes (Signed)
Occupational Therapy Session Note  Patient Details  Name: Kelly Wall MRN: [id number] Date of Birth: 1961-07-20  Today's Date: 04/08/2018 OT Individual Time: 1100-1200 OT Individual Time Calculation (min): 60 min    Short Term Goals: Week 1:  OT Short Term Goal 1 (Week 1): Pt will be able to eat 75% of her meals with set up with AE. OT Short Term Goal 2 (Week 1): Pt will be able to hold a washcloth in her L hand to be able to cleanse her perineal area. OT Short Term Goal 3 (Week 1): Pt will be able to don a tshirt with mod A.   OT Short Term Goal 4 (Week 1): Pt will be able to sit to stand with min A in preparation for for clothing management with toileting.  OT Short Term Goal 5 (Week 1): Pt will be able to maintain stand balance with mod A during clothing management tasks.  Skilled Therapeutic Interventions/Progress Updates:    Pt transitioned easily from PT session but fatigued from ambulation pre pt report. Pt requesting to perform self care at sink. OT assisted pt with donning of universal cuff and pt brushing teeth with set up A. Pt applying lipstick with use of B hands in gross grasp with set up A and increased time. Pt declined bathing but requesting to change clothing items. Pt seated to don pull over shirt with assistance to pull down trunk. Pt attempting foot stool and figure four position without success to don skirt this session. Pt threading shirt over arms and pulling down body instead with steady assistance for standing balance and increased time. Pt remaining in wheelchair at end of session with call bell and all needs within reach upon exiting the room.   Therapy Documentation Precautions:  Precautions Precautions: Fall, Cervical Required Braces or Orthoses: Cervical Brace Cervical Brace: Hard collar(donned in sitting) Restrictions Weight Bearing Restrictions: No General:   Vital Signs:   Pain: Pain Assessment Pain Scale: 0-10 Pain Score: 6  Pain Type:  Neuropathic pain;Acute pain Pain Location: Arm Pain Orientation: Left Pain Descriptors / Indicators: Aching;Heaviness Pain Frequency: Intermittent Pain Onset: On-going Patients Stated Pain Goal: 3 Pain Intervention(s): Medication (See eMAR) ADL: ADL ADL Comments: refer to functional navigator  See Function Navigator for Current Functional Status.   Therapy/Group: Individual Therapy  Alen Bleacher 04/08/2018, 12:45 PM

## 2018-04-08 NOTE — Progress Notes (Signed)
Physical Therapy Session Note  Patient Details  Name: Kelly Wall MRN: [id number] Date of Birth: 06-Oct-1961  Today's Date: 04/08/2018 PT Individual Time: 1000-1100 PT Individual Time Calculation (min): 60 min   Short Term Goals: Week 1:  PT Short Term Goal 1 (Week 1): Pt will utilize UE hooking to roll in bed with supervision to increase independence with pressure relief.  PT Short Term Goal 2 (Week 1): Pt will verbalize and demonstrate 2 methods of pressure relief while in w/c.  PT Short Term Goal 3 (Week 1): Pt will initiate w/c propulsion for cardiovascular endurance and UE strengthening PT Short Term Goal 4 (Week 1): Pt will ambulate with normal base of support 50% of observations  Skilled Therapeutic Interventions/Progress Updates:    no c/o pain at rest.  Session focus on cardiovascular endurance, UE strength and fine motor coordination/strength, and ambulation with RW.    Pt transitioned to EOB with bed features and supervision, PT donned shoes total assist for time management.  Gait to/from bathroom with min assist and L HHA.  Pt able to manage clothing and hygiene with supervision and use of grab bar.  W/C propulsion to therapy gym with BUEs for activity tolerance, cardiovascular endurance, and UE strength/coordination.  Transfer to mat with min assist without device for stand/pivot.  UE coordination/strengthening tasks with pegs, graded pegs, and cup stacking.  Challenged pt by encouraging thumb/forefinger grip as opposed to gross grasp pt uses to compensate.  Gait training back to room from therapy gym with RW, min guard for balance.  Pt noted to have R toe drag, slight hip hike on R during swing phase, and decreased LLE step length.  Hand off to OT in room at end of session.    Therapy Documentation Precautions:  Precautions Precautions: Fall, Cervical Required Braces or Orthoses: Cervical Brace Cervical Brace: Hard collar(donned in sitting) Restrictions Weight Bearing  Restrictions: No   See Function Navigator for Current Functional Status.   Therapy/Group: Individual Therapy  Stephania Fragmin 04/08/2018, 3:25 PM

## 2018-04-09 ENCOUNTER — Inpatient Hospital Stay (HOSPITAL_COMMUNITY): Payer: 59

## 2018-04-09 ENCOUNTER — Inpatient Hospital Stay (HOSPITAL_COMMUNITY): Payer: 59 | Admitting: Physical Therapy

## 2018-04-09 ENCOUNTER — Inpatient Hospital Stay (HOSPITAL_COMMUNITY): Payer: 59 | Admitting: Occupational Therapy

## 2018-04-09 MED ORDER — POLYETHYLENE GLYCOL 3350 17 G PO PACK
17.0000 g | PACK | Freq: Every day | ORAL | Status: DC
  Administered 2018-04-09 – 2018-04-23 (×11): 17 g via ORAL
  Filled 2018-04-09 (×16): qty 1

## 2018-04-09 MED ORDER — GABAPENTIN 600 MG PO TABS
600.0000 mg | ORAL_TABLET | Freq: Four times a day (QID) | ORAL | Status: DC
  Administered 2018-04-09 – 2018-04-23 (×57): 600 mg via ORAL
  Filled 2018-04-09 (×58): qty 1

## 2018-04-09 NOTE — Progress Notes (Signed)
Physical Therapy Session Note  Patient Details  Name: Kelly Wall MRN: [id number] Date of Birth: 1961/07/28  Today's Date: 04/09/2018 PT Individual Time: 8527-7824 PT Individual Time Calculation (min): 29 min   Short Term Goals: Week 1:  PT Short Term Goal 1 (Week 1): Pt will utilize UE hooking to roll in bed with supervision to increase independence with pressure relief.  PT Short Term Goal 2 (Week 1): Pt will verbalize and demonstrate 2 methods of pressure relief while in w/c.  PT Short Term Goal 3 (Week 1): Pt will initiate w/c propulsion for cardiovascular endurance and UE strengthening PT Short Term Goal 4 (Week 1): Pt will ambulate with normal base of support 50% of observations  Skilled Therapeutic Interventions/Progress Updates: Pt presented in w/c agreeable to therapy. Pt performed sit to stand min guard with RW and ambulated 228f with RW to rehab gym. Pt provided verbal cues for decreasing R step length to decrease compensatory trunk extension. Pt participated in side stepping , backwards walking, and high march at wall rail 259fx 2 with cues for improving coordination and attempting purposeful LE placement. Pt demonstrated good carryover with verbal cues. Pt ambulated back to room in same manner as prior and returned to w/c. Pt remained in w/c at end of session with family present and current needs met.      Therapy Documentation Precautions:  Precautions Precautions: Fall, Cervical Required Braces or Orthoses: Cervical Brace Cervical Brace: Hard collar(donned in sitting) Restrictions Weight Bearing Restrictions: No General:   Vital Signs: Therapy Vitals Temp: 98.2 F (36.8 C) Temp Source: Oral Pulse Rate: 60 Resp: 20 BP: 104/62 Patient Position (if appropriate): Sitting Oxygen Therapy SpO2: 98 % O2 Device: Room Air Pain: Pain Assessment Pain Scale: 0-10 Pain Score: 4  Pain Type: Acute pain Pain Location: Arm Pain Orientation: Left Pain Descriptors /  Indicators: Aching Pain Frequency: Constant Pain Onset: On-going Patients Stated Pain Goal: 2 Pain Intervention(s): Medication (See eMAR)  See Function Navigator for Current Functional Status.   Therapy/Group: Individual Therapy  Kelly Wall  Kelly Wall, PTA  04/09/2018, 4:12 PM
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
## 2018-04-09 NOTE — Progress Notes (Signed)
Physical Therapy Session Note  Patient Details  Name: Kelly Wall MRN: [id number] Date of Birth: 11/08/1961  Today's Date: 04/09/2018 PT Individual Time: 0800-0900 PT Individual Time Calculation (min): 60 min   Short Term Goals: Week 1:  PT Short Term Goal 1 (Week 1): Pt will utilize UE hooking to roll in bed with supervision to increase independence with pressure relief.  PT Short Term Goal 2 (Week 1): Pt will verbalize and demonstrate 2 methods of pressure relief while in w/c.  PT Short Term Goal 3 (Week 1): Pt will initiate w/c propulsion for cardiovascular endurance and UE strengthening PT Short Term Goal 4 (Week 1): Pt will ambulate with normal base of support 50% of observations  Skilled Therapeutic Interventions/Progress Updates:    Pt seated in w/c upon PT arrival, agreeable to therapy tx and denies pain. Pt donned shoes, socks and cervical collar with assist. Pt ambulated from room>gym x 200 ft with RW and min assist, decreased foot clearance with R LE due to lack of DF and steppage gait with R LE. Therapist applied ACE wrap for DF assist, ambulated x 80 ft with RW and min assist, improved gait kinematics with decreased steppage gait. Pt worked on dynamic standing balance without UE support to perform toe taps on 3 inch step, lateral stepping over object, and pre-gait stepping forward/backward in place with each LE, using ace wrap for DF on R LE. Pt ambulated back to room with RW and CGA, left seated in w/c with needs in reach.   Therapy Documentation Precautions:  Precautions Precautions: Fall, Cervical Required Braces or Orthoses: Cervical Brace Cervical Brace: Hard collar(donned in sitting) Restrictions Weight Bearing Restrictions: No   See Function Navigator for Current Functional Status.   Therapy/Group: Individual Therapy  Cresenciano Genre, PT, DPT 04/09/2018, 7:47 AM

## 2018-04-09 NOTE — Patient Care Conference (Signed)
Inpatient RehabilitationTeam Conference and Plan of Care Update Date: 04/09/2018   Time: 2:00 PM    Patient Name: Kelly Wall      Medical Record Number: [id number]  Date of Birth: 11/30/1960 Sex: Female         Room/Bed: 4W03C/4W03C-01 Payor Info: Payor: [employer] / Plan: [employer] / Product Type: *No Product type* /    Admitting Diagnosis: Cervical myelopathy  Admit Date/Time:  04/02/2018  5:13 PM Admission Comments: No comment available   Primary Diagnosis:  <principal problem not specified> Principal Problem: <principal problem not specified>  Patient Active Problem List   Diagnosis Date Noted  . Cervical myelopathy (HCC) 04/02/2018  . Bilateral arm weakness   . Numbness and tingling in both hands   . Paresthesia and pain of both upper extremities   . Trauma   . Post-operative pain   . Uncomplicated asthma   . Benign essential HTN   . Bradycardia   . Steroid-induced hyperglycemia   . 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 of cervical spinal cord (HCC) 03/30/2018  . S/P cervical spinal fusion 04/20/2016  . Cervical radicular pain 03/22/2016  . Hamstring tightness 12/11/2014  . 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 Pain in joint, upper arm 11/17/2014  . Labyrinthitis 05/15/2012  . Sinusitis acute 02/12/2012  . Well adult exam 06/27/2011  . Rash, skin 06/27/2011  . ADHESIVE CAPSULITIS, LEFT 11/09/2009  . SHOULDER PAIN 09/09/2009  . HAIR LOSS 07/24/2008  . ELEVATED BP 07/24/2008  . CARPAL TUNNEL SYNDROME 03/23/2008  . PARESTHESIA 03/23/2008  . MIGRAINE HEADACHE 08/02/2007    Expected Discharge Date: Expected Discharge Date: 04/23/18  Team Members Present: Physician leading conference: Dr. Faith Rogue Social Worker Present: Staci Acosta, LCSW Nurse Present: Kennyth Arnold, RN PT Present: Karolee Stamps, PT;Caitlin Penven-Crew, PT OT Present: Johnsie Cancel, OT PPS Coordinator present : Edson Snowball, PT     Current Status/Progress Goal Weekly Team Focus   Medical   c5 cord injury, motor/sensory incomplete. neurogenic bowel and bladder.   improve functional mobility  pain , bladder and bowel emptying   Bowel/Bladder   continent of b/b continue pvr occasional I&O cath needed  continent of b/b with normal pattern and emptying  assist with brp as needed continue pvr and cath prn   Swallow/Nutrition/ Hydration             ADL's   min A UB self care, mod A LB self care, set up A - min A grooming at sink with use of universal cuff, min A shower and toilet transfer, mod A toileting  supervision overall  UE NMR, trunk control, balance, ADL retraining, functional transfers, AE training, pt/family education   Mobility   min guard with RW, progressing well with LEs, slow with UEs  supervision for out of bed mobility  LE and UE NMR, balance, all functional mobility   Communication             Safety/Cognition/ Behavioral Observations            Pain   pain controlled with current care plan oxycodone prn tylenol prn baclofen prn  pain  =<4/10  assess pain qshift medicate prn and assess for relief notify MD for unrelived pain   Skin   surgical incision to anterior neck  healing without any s/sx of infection skin free from breakdown  assess q shift and prn    Rehab Goals Patient on target to meet rehab goals: Yes Rehab Goals Revised:  none *See Care Plan and progress notes for long and short-term goals.     Barriers to Discharge  Current Status/Progress Possible Resolutions Date Resolved   Physician    Neurogenic Bowel & Bladder;Medical stability        ongoing medical mgt as outlined in chart      Nursing                  PT                    OT                  SLP                SW                Discharge Planning/Teaching Needs:  Pt plans to go to her mother's home at d/c where her family can provide supervision, as needed.  Pt's family will be offered family education closer to d/c.   Team Discussion:  Pt with C5  incomplete cord injury with sensory issues, pain in left upper extremity, and neurogenic bowel and bladder (improving).  Pt is continent and is eating lots of prunes and other fruits to produce a bowel movement.  She has discomfort with Aspen collar.  Pt using PRN pain medications, muscle relaxants, and nerve pain medication.  Pt is walking with awkward gait, but it is getting better.  Pt needs to have return in her hands.  Pt is mod A with ADLs due to hands, as grasping is difficult.  Using adaptive equipment and devices for ADLs.  Pt is very motivated with overall supervision level goals.  Revisions to Treatment Plan:  none    Continued Need for Acute Rehabilitation Level of Care: The patient requires daily medical management by a physician with specialized training in physical medicine and rehabilitation for the following conditions: Daily direction of a multidisciplinary physical rehabilitation program to ensure safe treatment while eliciting the highest outcome that is of practical value to the patient.: Yes Daily medical management of patient stability for increased activity during participation in an intensive rehabilitation regime.: Yes Daily analysis of laboratory values and/or radiology reports with any subsequent need for medication adjustment of medical intervention for : Neurological problems;Post surgical problems;Urological problems  Violet Cart, [place] Deck 04/09/2018, 2:27 PM

## 2018-04-09 NOTE — Progress Notes (Signed)
Occupational Therapy Session Note  Patient Details  Name: Kelly Wall MRN: [id number] Date of Birth: 04-04-1961  Today's Date: 04/09/2018 OT Individual Time: 7829-5621 OT Individual Time Calculation (min): 69 min    Short Term Goals: Week 1:  OT Short Term Goal 1 (Week 1): Pt will be able to eat 75% of her meals with set up with AE. OT Short Term Goal 2 (Week 1): Pt will be able to hold a washcloth in her L hand to be able to cleanse her perineal area. OT Short Term Goal 3 (Week 1): Pt will be able to don a tshirt with mod A.   OT Short Term Goal 4 (Week 1): Pt will be able to sit to stand with min A in preparation for for clothing management with toileting.  OT Short Term Goal 5 (Week 1): Pt will be able to maintain stand balance with mod A during clothing management tasks.  Skilled Therapeutic Interventions/Progress Updates:    Pt seen for OT ADL bathing/dressing session. Pt sitting up in w/c upon arrival with hand off from PT. Pt desiring to shower. She ambulated throughout session using RW with min A. She completed functional transfers throughout session with steadying assist, VCs for proper hand placement on AD. Pt able to doff clothing with increased time and VCs for problem solving techniques. She bathed seated on tub bench, able to manage regular wash cloth today vs. Using bath mit. Assist required to manage handheld shower head due to decreased grip strength and control. She returned to w/c to dress, able to thread B LEs into pants though requiring increased assist for pulling pants up and donning socks/shoes,  due to decreased grip strength and dexterity.  Applied Co-ban to pt's comb for improved grasp and introduced red built up gripper for eating utensils. Trialed use in simulated manner- scheduled to have breakfast with pt tomorrow to address self-feeding and adaptive techniques/ equipment. Pt left seated in w/c at end of session, all needs in reach awaiting lunch.    Therapy Documentation Precautions:  Precautions Precautions: Fall, Cervical Required Braces or Orthoses: Cervical Brace Cervical Brace: Hard collar(donned in sitting) Restrictions Weight Bearing Restrictions: No Pain: Pain Assessment Pain Scale: 0-10 Pain Score: No/denies pain ADL: ADL ADL Comments: refer to functional navigator  See Function Navigator for Current Functional Status.   Therapy/Group: Individual Therapy  Lorelle Macaluso L 04/09/2018, 6:57 AM

## 2018-04-09 NOTE — Progress Notes (Signed)
Physical Therapy Session Note  Patient Details  Name: Kelly Wall MRN: [id number] Date of Birth: Apr 13, 1961  Today's Date: 04/09/2018 PT Individual Time: 1015-1045 PT Individual Time Calculation (min): 30 min   Short Term Goals: Week 1:  PT Short Term Goal 1 (Week 1): Pt will utilize UE hooking to roll in bed with supervision to increase independence with pressure relief.  PT Short Term Goal 2 (Week 1): Pt will verbalize and demonstrate 2 methods of pressure relief while in w/c.  PT Short Term Goal 3 (Week 1): Pt will initiate w/c propulsion for cardiovascular endurance and UE strengthening PT Short Term Goal 4 (Week 1): Pt will ambulate with normal base of support 50% of observations  Skilled Therapeutic Interventions/Progress Updates:    Session focused on gait training with RW to/from therapy with focus on normalizing gait pattern, widening BOS, and heel strike on R with overall steadying assist and improved coordination and control noted. NMR for stair negotiation using bilateral rails x 8 steps with min to mod assist needed for eccentric control during descension but improved to min assist with practice and multimodal cues. Pt expressed desire to walk between therapy sessions. Educated pt and documented on safety plan ok for nursing staff with walk with pt with RW and demonstrated technique to use BLE in w/c for propulsion to work on reciprocal movement pattern retraining and functional strengthening as well. Pt very grateful for suggestions and continues to be very motivated.   Therapy Documentation Precautions:  Precautions Precautions: Fall, Cervical Required Braces or Orthoses: Cervical Brace Cervical Brace: Hard collar(donned in sitting) Restrictions Weight Bearing Restrictions: No   Pain: Pain Assessment Pain Scale: 0-10 Pain Score: 5  Pain Type: Acute pain;Neuropathic pain Pain Location: Arm Pain Orientation: Left Pain Descriptors / Indicators:  Aching;Heaviness Pain Frequency: Constant Pain Onset: On-going Patients Stated Pain Goal: 2 Pain Intervention(s): Medication (See eMAR)  See Function Navigator for Current Functional Status.   Therapy/Group: Individual Therapy  Karolee Stamps Darrol Poke, PT, DPT  04/09/2018, 11:23 AM

## 2018-04-10 ENCOUNTER — Inpatient Hospital Stay (HOSPITAL_COMMUNITY): Payer: 59 | Admitting: Physical Therapy

## 2018-04-10 ENCOUNTER — Inpatient Hospital Stay (HOSPITAL_COMMUNITY): Payer: 59 | Admitting: Occupational Therapy

## 2018-04-10 NOTE — Progress Notes (Signed)
Physical Therapy Session Note  Patient Details  Name: Kelly Wall MRN: [id number] Date of Birth: 10-Jan-1961  Today's Date: 04/10/2018 PT Individual Time: 1005-1105 PT Individual Time Calculation (min): 60 min   Short Term Goals: Week 1:  PT Short Term Goal 1 (Week 1): Pt will utilize UE hooking to roll in bed with supervision to increase independence with pressure relief.  PT Short Term Goal 2 (Week 1): Pt will verbalize and demonstrate 2 methods of pressure relief while in w/c.  PT Short Term Goal 3 (Week 1): Pt will initiate w/c propulsion for cardiovascular endurance and UE strengthening PT Short Term Goal 4 (Week 1): Pt will ambulate with normal base of support 50% of observations  Skilled Therapeutic Interventions/Progress Updates: Pt presented in w/c agreeable to therapy. Pt performed sit to stand from w/c and ambulated to reabh gym min guard with increased time, intermittent verbal cues for decreasing R step length to maintain erect posture and decrease trunk extension. Pt performed sit to/from stand from mat with no UE support for strengthening and then with 2in step under LLE for increased recruitment of RLE. Pt participated in standing balance/coordination activities including toe touch to target and alternating toe taps to 4in step no AD requiring min guard and increased sway noted with wt shift to R with x 1 LOB requiring minA from PTA for recovery. Pt participated in standing balance on Airex with min challenges with pt maintaining fair balance. Pt ambulated back to room with RW to nsg station then no AD from nsg station to room. Pt required minA for gait with no AD and verbal cues for step length and decreased compensatory movements. Pt returned to w/c at end of session with call bell within reach and needs met.      Therapy Documentation Precautions:  Precautions Precautions: Fall, Cervical Required Braces or Orthoses: Cervical Brace Cervical Brace: Hard collar(donned in  sitting) Restrictions Weight Bearing Restrictions: No General:   Vital Signs: Therapy Vitals Temp: 98 F (36.7 C) Temp Source: Oral Pulse Rate: 61 Resp: 19 BP: 130/82 Patient Position (if appropriate): Sitting Oxygen Therapy SpO2: 99 % O2 Device: Room Air Pain: Pain Assessment Pain Scale: 0-10 Pain Score: 3  Pain Type: Acute pain Pain Location: Arm Pain Orientation: Left Pain Radiating Towards: back Pain Descriptors / Indicators: Aching Pain Frequency: Constant Pain Onset: On-going Pain Intervention(s): Medication (See eMAR)   See Function Navigator for Current Functional Status.   Therapy/Group: Individual Therapy  Reia Viernes  Katalin Colledge, PTA  04/10/2018, 4:12 PM

## 2018-04-10 NOTE — Progress Notes (Signed)
Occupational Therapy Session Note  Patient Details  Name: Kelly Wall MRN: [id number] Date of Birth: 10/30/1961  Today's Date: 04/10/2018 OT Individual Time: 4010-2725 OT Individual Time Calculation (min): 60 min    Short Term Goals: Week 1:  OT Short Term Goal 1 (Week 1): Pt will be able to eat 75% of her meals with set up with AE. OT Short Term Goal 2 (Week 1): Pt will be able to hold a washcloth in her L hand to be able to cleanse her perineal area. OT Short Term Goal 3 (Week 1): Pt will be able to don a tshirt with mod A.   OT Short Term Goal 4 (Week 1): Pt will be able to sit to stand with min A in preparation for for clothing management with toileting.  OT Short Term Goal 5 (Week 1): Pt will be able to maintain stand balance with mod A during clothing management tasks.  Skilled Therapeutic Interventions/Progress Updates:    Pt seen for OT session focusing on fine motor coordination during self feeding activities. Pt sitting up in w/c upon arrival with visitor present, agreeable to tx session.  She ambulated to therapy day room with min A using RW, VCs to increase BOS for each step. In day room, addressed pincer grasp, practicing picking up blueberries with R then L hand. Pt able to pick up individual berries using pincer grasp with increased time. Then completed 9 hole peg test, see results below.  9 Hole Peg Test:  L: 2 min 51 sec R: 3 min 35 sec  Pt ambulated back to room at end of session, observed heavy reliance on B UEs using RW leading to pt's shoulder tightness and discomfort. Completed ambulation back to room with HHA to lessen UE reliance, completed with mod A and VCs. Pt left seated in w/c at end of session, all needs in reach.     Therapy Documentation Precautions:  Precautions Precautions: Fall, Cervical Required Braces or Orthoses: Cervical Brace Cervical Brace: Hard collar(donned in sitting) Restrictions Weight Bearing Restrictions: No Pain: Pain  Assessment Pain Score: Asleep ADL: ADL ADL Comments: refer to functional navigator  See Function Navigator for Current Functional Status.   Therapy/Group: Individual Therapy  Ayvah Caroll L 04/10/2018, 7:09 AM

## 2018-04-10 NOTE — Progress Notes (Signed)
Physical Therapy Session Note  Patient Details  Name: Kelly Wall MRN: [id number] Date of Birth: 1961/01/02  Today's Date: 04/10/2018 PT Individual Time: 1445-1600 PT Individual Time Calculation (min): 75 min   Short Term Goals: Week 1:  PT Short Term Goal 1 (Week 1): Pt will utilize UE hooking to roll in bed with supervision to increase independence with pressure relief.  PT Short Term Goal 2 (Week 1): Pt will verbalize and demonstrate 2 methods of pressure relief while in w/c.  PT Short Term Goal 3 (Week 1): Pt will initiate w/c propulsion for cardiovascular endurance and UE strengthening PT Short Term Goal 4 (Week 1): Pt will ambulate with normal base of support 50% of observations  Skilled Therapeutic Interventions/Progress Updates:    Pt seated in w/c in room, agreeable to PT. No complaints of pain. Ambulation x 200 ft with RW and CGA for balance, occasional decreased RLE clearance with decreased attention and onset of fatigue. Pt also ambulates with flexed trunk, v/c for upright posture. Ambulation 3 x 90 ft with no AD and min to mod A for balance. Pt exhibits narrower BOS, decreased B step length, decreased RLE clearance, and increased ataxia with amb with no AD. Ascend/descend 12 stairs with 2 handrails and min A for balance, v/c for step-to gait pattern. Standing balance and coordination activities: backwards amb with no AD and mod A for balance, side-steps with BUE support and CGA for balance, alt L/R 6" step-taps with BUE support, alt L/R cone taps with BUE to one UE support with min A for balance. Toilet transfer with CGA with use of grab bar, pt requires assist for 2/3 toileting steps for clothing management. Pt left seated in w/c in room with needs in reach, friends present to visit patient.  Therapy Documentation Precautions:  Precautions Precautions: Fall, Cervical Required Braces or Orthoses: Cervical Brace Cervical Brace: Hard collar(donned in  sitting) Restrictions Weight Bearing Restrictions: No  See Function Navigator for Current Functional Status.   Therapy/Group: Individual Therapy  Peter Congo, PT, DPT  04/10/2018, 5:18 PM
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
## 2018-04-10 NOTE — Progress Notes (Signed)
Social Work Patient ID: Kelly Wall, female   DOB: 1961/09/17, 57 y.o.   MRN: [id number]   CSW met with pt 04-09-18 to update her on team conference discussion and targeted d/c date of 04-23-18.  Pt feels encouraged with her progress and that she only needs to be here 3 weeks instead of 4 weeks.  She is motivated and hoping to decrease her LOS even more.  Pt appreciative of care on CIR.  CSW will continue to follow and assist as needed.

## 2018-04-11 ENCOUNTER — Inpatient Hospital Stay (HOSPITAL_COMMUNITY): Payer: 59 | Admitting: Occupational Therapy

## 2018-04-11 ENCOUNTER — Inpatient Hospital Stay (HOSPITAL_COMMUNITY): Payer: 59 | Admitting: Physical Therapy

## 2018-04-11 NOTE — Progress Notes (Signed)
Orthopedic Tech Progress Note Patient Details:  Kelly Wall Dec 16, 1960 [id number] Brace completed by [facility]. Patient ID: Kelly Wall, female   DOB: 28-Mar-1961, 57 y.o.   MRN: [id number]   Kelly Wall 04/11/2018, 3:22 PM

## 2018-04-11 NOTE — Progress Notes (Signed)
Orthopedic Tech Progress Note Patient Details:  Kelly Wall [birth year] [id number]  Patient ID: Kelly Wall, female   DOB: 1961/07/14, 57 y.o.   MRN: [id number]   [name] 04/11/2018, 9:23 AM Called in [facility] brace order; spoke with Morrie Sheldon
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
## 2018-04-11 NOTE — Progress Notes (Signed)
Physical Therapy Session Note  Patient Details  Name: Kelly Wall MRN: [id number] Date of Birth: Aug 15, 1961  Today's Date: 04/11/2018 PT Individual Time: 4098-1191 PT Individual Time Calculation (min): 60 min   Short Term Goals: Week 2:  PT Short Term Goal 1 (Week 2): Pt will perform bed mobility with Supervision assist PT Short Term Goal 2 (Week 2): Pt will ambulate x 200 ft with LRAD with Supervision assist consistently PT Short Term Goal 3 (Week 2): Pt will utilize UE hooking to roll in bed with supervision to increase independence with pressure relief.   Skilled Therapeutic Interventions/Progress Updates:  Pt received in bathroom & agreeable to tx. Pt with continent BM on toilet and required total assist for peri hygiene 2/2 impaired use of BUE. Pt ambulates within room & bathroom with RW & supervision. Pt performed peri hygiene standing at sink with supervision. Pt ambulates room<>gym with supervision and impaired foot clearance & heel strike RLE. Pt performed lateral side steps at rail in hallway with yellow progressing to green theraband with task focusing on BLE strengthening. Pt engaged in BUE task focusing on neuro re-ed (pt threaded shapes of various sizes on string) with cuing to grasp object with pt reporting difficulty after task. At end of session pt left sitting in w/c in room with quick release belt & all needs within reach.   Therapy Documentation Precautions:  Precautions Precautions: Fall, Cervical Required Braces or Orthoses: Cervical Brace Cervical Brace: Hard collar(donned in sitting) Restrictions Weight Bearing Restrictions: No  Pain: Pt reports heaviness & sensitivity in L hand.   See Function Navigator for Current Functional Status.   Therapy/Group: Individual Therapy  Sandi Mariscal 04/11/2018, 5:14 PM

## 2018-04-11 NOTE — Progress Notes (Signed)
Physical Therapy Weekly Progress Note  Patient Details  Name: Kelly Wall MRN: [id number] Date of Birth: 01-04-1961  Beginning of progress report period: Apr 03, 2018 End of progress report period: Apr 11, 2018  Today's Date: 04/11/2018 PT Individual Time: 1015-1130 PT Individual Time Calculation (min): 75 min   Patient has met 3 of 4 short term goals.  Pt still needs assist with bed mobility and to perform pressure relief techniques at bed level.  Patient continues to demonstrate the following deficits muscle weakness and ataxia and decreased coordination and therefore will continue to benefit from skilled PT intervention to increase functional independence with mobility.  Patient progressing toward long term goals..  Continue plan of care.  PT Short Term Goals Week 1:  PT Short Term Goal 1 (Week 1): Pt will utilize UE hooking to roll in bed with supervision to increase independence with pressure relief.  PT Short Term Goal 1 - Progress (Week 1): Progressing toward goal PT Short Term Goal 2 (Week 1): Pt will verbalize and demonstrate 2 methods of pressure relief while in w/c.  PT Short Term Goal 2 - Progress (Week 1): Met PT Short Term Goal 3 (Week 1): Pt will initiate w/c propulsion for cardiovascular endurance and UE strengthening PT Short Term Goal 3 - Progress (Week 1): Met PT Short Term Goal 4 (Week 1): Pt will ambulate with normal base of support 50% of observations PT Short Term Goal 4 - Progress (Week 1): Met Week 2:  PT Short Term Goal 1 (Week 2): Pt will perform bed mobility with Supervision assist PT Short Term Goal 2 (Week 2): Pt will ambulate x 200 ft with LRAD with Supervision assist consistently PT Short Term Goal 3 (Week 2): Pt will utilize UE hooking to roll in bed with supervision to increase independence with pressure relief.   Skilled Therapeutic Interventions/Progress Updates:  Pt received seated in w/c in room, agreeable to PT. Pt reports neuropathic pain  in BUE, L>R, not rated. Assisted pt with completing ADLs at sink level: brushing hair, applying lipstick, applying lotion. Toilet transfer with SBA with use of grab bar, pt is able to complete 3/3 toileting steps. Took pt outdoors for functional gait training across uneven terrain. Ambulation x 200 ft with RW and CGA across uneven ground with focus on heel strike and decreasing step length with RLE. Ambulation 2 x 100 ft with no AD and minA for balance across uneven ground, decreased step length and heel strike with no AD as well as increased trunk extension. Manual w/c propulsion x 200 ft with Supervision with use of BUE. Sit to/from supine from flat bed with minA for initiation of pushup from sidelying position with BUE. Pt left seated in w/c in room with needs in reach.  Therapy Documentation Precautions:  Precautions Precautions: Fall, Cervical Required Braces or Orthoses: Cervical Brace Cervical Brace: Hard collar(donned in sitting) Restrictions Weight Bearing Restrictions: No  See Function Navigator for Current Functional Status.  Therapy/Group: Individual Therapy  Excell Seltzer, PT, DPT  04/11/2018, 12:20 PM

## 2018-04-11 NOTE — Progress Notes (Signed)
Occupational Therapy Weekly Progress Note  Patient Details  Name: Kelly Wall MRN: [id number] Date of Birth: February 02, 1961  Beginning of progress report period: Apr 03, 2018 End of progress report period: Apr 11, 2018  Today's Date: 04/11/2018 OT Individual Time: 2330-0762 OT Individual Time Calculation (min): 60 min    Patient has met 5 of 5 short term goals.  Pt is making excellent progress towards OT goals. She is able to complete functional transfers at supervision level using RW and min A for functional ambulation, able to manage RW in functional manner with B UEs. She cont to be most limited by decreased fine motor control with limited UE ROM and strength. Pt remains very motivated and eager to return to independent and active lifestyle she enjoyed PTA.   Patient continues to demonstrate the following deficits: abnormal posture, acute pain and tetraplegia at level C5 and therefore will continue to benefit from skilled OT intervention to enhance overall performance with BADL and Reduce care partner burden.  Patient progressing toward long term goals..  Continue plan of care.  OT Short Term Goals Week 1:  OT Short Term Goal 1 (Week 1): Pt will be able to eat 75% of her meals with set up with AE. OT Short Term Goal 1 - Progress (Week 1): Met OT Short Term Goal 2 (Week 1): Pt will be able to hold a washcloth in her L hand to be able to cleanse her perineal area. OT Short Term Goal 2 - Progress (Week 1): Met OT Short Term Goal 3 (Week 1): Pt will be able to don a tshirt with mod A.   OT Short Term Goal 3 - Progress (Week 1): Met OT Short Term Goal 4 (Week 1): Pt will be able to sit to stand with min A in preparation for for clothing management with toileting.  OT Short Term Goal 4 - Progress (Week 1): Met OT Short Term Goal 5 (Week 1): Pt will be able to maintain stand balance with mod A during clothing management tasks. OT Short Term Goal 5 - Progress (Week 1): Met Week 2:  OT Short  Term Goal 1 (Week 2): Pt will complete 3/3 toileting tasks with steadying assist OT Short Term Goal 2 (Week 2): Pt will complete 2 grooming tasks using AE PRN with assist for set-up OT Short Term Goal 3 (Week 2): Pt will don B shoes with min A  Skilled Therapeutic Interventions/Progress Updates:    Pt seen for OT ADL bathing/dressing session. Pt sitting up in w/c upon arrival with friend present pt agreeable to tx session. She ambulated thorughout session with RW and CGA. Pt able to gather clothing items in prep for dressing task, VCs for RW management in functional context.  She completed toileting task with steadying assist, able to manage pants down over hips with increased time, assist required to move pants/socks from around feet when doffing clothing for showering task.  She bathed seated on tub bench, able to use standard wash cloth and maintain functional grasp for task as well as able to manage small soap bottle. Assist to obtain figure four sitting position with R LE in order to wash R foot.  She dressed seated on toilet, with increased time able to complete task independently and pt very excited.  Pt fatigued following donning of one sock due to effort required to functionally grasp and pull up sock.  Pt left seated in w/c at end of session, set-up with lap top per request  and all needs in reach.   Therapy Documentation Precautions:  Precautions Precautions: Fall, Cervical Required Braces or Orthoses: Cervical Brace Cervical Brace: Hard collar(donned in sitting) Restrictions Weight Bearing Restrictions: No  ADL: ADL ADL Comments: refer to functional navigator  See Function Navigator for Current Functional Status.   Therapy/Group: Individual Therapy  Lilyanah Celestin L 04/11/2018, 6:56 AM

## 2018-04-12 ENCOUNTER — Inpatient Hospital Stay (HOSPITAL_COMMUNITY): Payer: 59 | Admitting: Physical Therapy

## 2018-04-12 ENCOUNTER — Inpatient Hospital Stay (HOSPITAL_COMMUNITY): Payer: 59 | Admitting: Occupational Therapy

## 2018-04-12 NOTE — Progress Notes (Signed)
Physical Therapy Session Note  Patient Details  Name: Kelly Wall MRN: [id number] Date of Birth: 24-Dec-1960  Today's Date: 04/12/2018 PT Individual Time: 1300-1350 PT Individual Time Calculation (min): 50 min   Short Term Goals: Week 2:  PT Short Term Goal 1 (Week 2): Pt will perform bed mobility with Supervision assist PT Short Term Goal 2 (Week 2): Pt will ambulate x 200 ft with LRAD with Supervision assist consistently PT Short Term Goal 3 (Week 2): Pt will utilize UE hooking to roll in bed with supervision to increase independence with pressure relief.   Skilled Therapeutic Interventions/Progress Updates:    no c/o pain.  Session focus on NMR for LE and UE via ambulation and fine/gross motor tasks.    PT adjusted pt's CO and applied padding to reduce risk of skin breakdown and pressure injury.  Pt transfers throughout session with RW and supervision.  Gait to and from therapy gym with supervision and RW, min cues to attend to R heel strike.  Pt engaged in UE tasks focus on fine and gross motor coordination with focus on normalized movement patterns.  Pt completes scooping activity with spoon, pincher grasp activity with beads, opening/closing medicine bottles, and theraputty exercises with bilat hands.  Educated on balancing rest/recovery with activity to maximize progress while in therapy and pt verbalized understanding.  Pt returned to room at end of session and positioned in w/c with call bell in reach and needs met.   Therapy Documentation Precautions:  Precautions Precautions: Fall, Cervical Required Braces or Orthoses: Cervical Brace Cervical Brace: Hard collar(donned in sitting) Restrictions Weight Bearing Restrictions: No   See Function Navigator for Current Functional Status.   Therapy/Group: Individual Therapy  Michel Santee 04/12/2018, 3:14 PM
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
## 2018-04-12 NOTE — Progress Notes (Signed)
Physical Therapy Session Note  Patient Details  Name: Kelly Wall MRN: [id number] Date of Birth: [birth year]  Today's Date: 04/12/2018 PT Individual Time: 0900-1000 PT Individual Time Calculation (min): 60 min   Short Term Goals: Week 2:  PT Short Term Goal 1 (Week 2): Pt will perform bed mobility with Supervision assist PT Short Term Goal 2 (Week 2): Pt will ambulate x 200 ft with LRAD with Supervision assist consistently PT Short Term Goal 3 (Week 2): Pt will utilize UE hooking to roll in bed with supervision to increase independence with pressure relief.   Skilled Therapeutic Interventions/Progress Updates:    Pt seated in w/c in room, agreeable to PT. No complaints of pain. Pt does report new neck brace is cutting into her skin, provided wash cloths for cushioning and pt reports some relief. Toilet transfer with SBA with use of grab bar and RW. Pt requires assist for clothing management after toileting due to tight pants. Ambulation 2 x 200' with RW and Supervision, focus on heel strike with RLE during gait. Standing tolerance 2 x 10 min while playing Connect 4 game with focus on fine motor control and grasp with BUE. Side-steps 4 x 30 ft with BUE support and green theraband around thighs, CGA for balance. Pt left seated in w/c in room with needs in reach, chair alarm and quick release belt in place.  Therapy Documentation Precautions:  Precautions Precautions: Fall, Cervical Required Braces or Orthoses: Cervical Brace Cervical Brace: Hard collar(donned in sitting) Restrictions Weight Bearing Restrictions: No  See Function Navigator for Current Functional Status.   Therapy/Group: Individual Therapy  Peter Congo, PT, DPT  04/12/2018, 4:08 PM

## 2018-04-13 ENCOUNTER — Inpatient Hospital Stay (HOSPITAL_COMMUNITY): Payer: 59 | Admitting: Occupational Therapy

## 2018-04-13 ENCOUNTER — Inpatient Hospital Stay (HOSPITAL_COMMUNITY): Payer: 59 | Admitting: Physical Therapy

## 2018-04-13 DIAGNOSIS — K5901 Slow transit constipation: Secondary | ICD-10-CM

## 2018-04-13 DIAGNOSIS — G834 Cauda equina syndrome: Secondary | ICD-10-CM

## 2018-04-13 NOTE — Progress Notes (Signed)
Physical Therapy Session Note  Patient Details  Name: Kelly Wall MRN: [id number] Date of Birth: 1961-08-06  Today's Date: 04/13/2018 PT Individual Time: 1530-1600 PT Individual Time Calculation (min): 30 min   Short Term Goals: Week 2:  PT Short Term Goal 1 (Week 2): Pt will perform bed mobility with Supervision assist PT Short Term Goal 2 (Week 2): Pt will ambulate x 200 ft with LRAD with Supervision assist consistently PT Short Term Goal 3 (Week 2): Pt will utilize UE hooking to roll in bed with supervision to increase independence with pressure relief.   Skilled Therapeutic Interventions/Progress Updates:   Pt received sitting in WC and agreeable to PT. PT instructed pt in gait training with RW x 282f with supervision assist and min cues for improve heel contact with BLE. PT also instructed pt in gait training without AD [id number] min Assist. Cues for increased step width and heel contact throughout. Nustep reciprocal movement and endurance training with BUE and BLE x 1025m level 7. Min cues for improved attention to the RUE due to poor grasp strength. Patient returned to room and [facility] and all needs met.        Therapy Documentation Precautions:  Precautions Precautions: Fall, Cervical Required Braces or Orthoses: Cervical Brace Cervical Brace: Hard collar(donned in sitting) Restrictions Weight Bearing Restrictions: No Pain: Pain Assessment Pain Score: 5    See Function Navigator for Current Functional Status.   Therapy/Group: Individual Therapy  AusLorie Phenix18/2019, 5:52 PM
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
## 2018-04-13 NOTE — Progress Notes (Addendum)
Occupational Therapy Session Note  Patient Details  Name: Kelly Wall MRN: [id number] Date of Birth: Nov 20, 1961  Today's Date: 04/13/2018 OT Group Time: 1105-1200 OT Group Time Calculation (min): 55 min   Skilled Therapeutic Interventions/Progress Updates:    Pt participated in therapeutic w/c level dance group with focus on UE/LE AROM and strengthening, dynamic balance, standing endurance and social participation. Pt stood 90% of time during session (using RW), swaying hips, kicking feet out interchangeably, and moving arms in beat to music. She'd dance in squatted positions to "twist" with steady assist. Pt also holding hands and swaying with other group members, singing to requested [employer]. At end of tx pt ambulated short distance to w/c and was escorted back to room by RN.    Therapy Documentation Precautions:  Precautions Precautions: Fall, Cervical Required Braces or Orthoses: Cervical Brace Cervical Brace: Hard collar(donned in sitting) Restrictions Weight Bearing Restrictions: No Pain: Pain Assessment Pain Scale: 0-10 Pain Score: 8  Pain Type: Acute pain Pain Location: Arm Pain Orientation: Left Pain Descriptors / Indicators: Burning Pain Onset: Gradual Pain Intervention(s): Medication (See eMAR) ADL: ADL ADL Comments: refer to functional navigator     See Function Navigator for Current Functional Status.   Therapy/Group: Group Therapy  Alizay Bronkema A Vielka Klinedinst 04/13/2018, 12:29 PM

## 2018-04-14 ENCOUNTER — Inpatient Hospital Stay (HOSPITAL_COMMUNITY): Payer: 59 | Admitting: Occupational Therapy

## 2018-04-14 NOTE — Progress Notes (Signed)
Occupational Therapy Session Note  Patient Details  Name: Kelly Wall MRN: [id number] Date of Birth: 1961-02-02  Today's Date: 04/14/2018 OT Individual Time: 1310-1400 OT Individual Time Calculation (min): 50 min   Skilled Therapeutic Interventions/Progress Updates:    Pt greeted in w/c, ready to go and agreeable to earlier than scheduled tx. Pt donned shoes with assist for Lt and for tying both sets of laces. Once escorted outdoors, pt worked on functional ambulation without device. Pt initially required steady assist for first 2 laps around fountain, fading to close supervision for remaining 7 laps. Worked on incorporating/bringing awareness to bilateral arm swing while walking. Also focused on correcting small LOBs on her own (typically towards Lt side). Pt crying joyfully during tx, encouraged that she was walking without physical assist from therapist. Stating "I didn't think I would be doing this two weeks from my surgery." Visitor was present, cheering her on, and taking pictures. She took 2 seated rest breaks while outside. At end of tx she was escorted back to room and left with all needs, set up for lunch.   Therapy Documentation Precautions:  Precautions Precautions: Fall, Cervical Required Braces or Orthoses: Cervical Brace Cervical Brace: Hard collar(donned in sitting) Restrictions Weight Bearing Restrictions: No Vital Signs: Therapy Vitals Pulse Rate: 66 Resp: 16 BP: (!) 86/56(nurse notified) Patient Position (if appropriate): Sitting Oxygen Therapy SpO2: 96 % O2 Device: Room Air Pain: Pain Assessment Pain Score: 5  ADL: ADL ADL Comments: refer to functional navigator     See Function Navigator for Current Functional Status.   Therapy/Group: Individual Therapy  [name] A Marilyn Wing 04/14/2018, 3:58 PM
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
## 2018-04-14 NOTE — Therapy (Signed)
Occupational Therapy 04/12/2018  1030-1130 and  1355-1432  Total minutes=97  Patient participated in skilled as follows:  Left and right hand digital coordination, tenosis ranging, strength and verbal instruction to complete retrograde massage in left hand (she stated her left hand was swollen and tender to touch and that she is the only person who can touch it without excruciating pain)  As well, she cmpleted toileting transfer via walker room to toilet with close S and toileting management with close S.   As wlel, she completed w/c ot bed bed transfer with close S and bed mobility with supervision.

## 2018-04-15 ENCOUNTER — Encounter (HOSPITAL_COMMUNITY): Payer: 59 | Admitting: Psychology

## 2018-04-15 ENCOUNTER — Inpatient Hospital Stay (HOSPITAL_COMMUNITY): Payer: 59

## 2018-04-15 MED ORDER — BETHANECHOL CHLORIDE 10 MG PO TABS
10.0000 mg | ORAL_TABLET | Freq: Three times a day (TID) | ORAL | Status: DC
  Administered 2018-04-15 – 2018-04-17 (×6): 10 mg via ORAL
  Filled 2018-04-15 (×6): qty 1

## 2018-04-15 NOTE — Progress Notes (Signed)
Occupational Therapy Session Note  Patient Details  Name: Kelly Wall MRN: [id number] Date of Birth: [birth year]  Today's Date: 04/15/2018 OT Individual Time: 1100-1200 OT Individual Time Calculation (min): 60 min    Short Term Goals: Week 2:  OT Short Term Goal 1 (Week 2): Pt will complete 3/3 toileting tasks with steadying assist OT Short Term Goal 2 (Week 2): Pt will complete 2 grooming tasks using AE PRN with assist for set-up OT Short Term Goal 3 (Week 2): Pt will don B shoes with min A  Skilled Therapeutic Interventions/Progress Updates:    Pt resting in w/c upon arrival.  Pt stated she had already bathed and changed clothing earlier.  OT intervention with focus on functional amb without AD, standing balance, and BUE FMC/strengthening. Pt issued elastic shoe laces to increase independence with donning shoes.  Pt amb without AD to gym and engaged in BUE FMC/strengthening tasks with colored pegs/peg board and clothes pins.  Pt able to grasp and place yellow and red clothes pins.  Pt placed and removed colored pegs from peg board.  Pt returned to room and remained seated in w/c with chair alarm activated.  Therapist set up lunch tray before exiting room.    Therapy Documentation Precautions:  Precautions Precautions: Fall, Cervical Required Braces or Orthoses: Cervical Brace Cervical Brace: Hard collar(donned in sitting) Restrictions Weight Bearing Restrictions: No  Pain:  Pt c/o RUE/hand neuropathic pain; RN aware  See Function Navigator for Current Functional Status.   Therapy/Group: Individual Therapy  Rich Brave 04/15/2018, 12:05 PM
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
## 2018-04-15 NOTE — Progress Notes (Signed)
Occupational Therapy Note  Patient Details  Name: Kelly Wall MRN: [id number] Date of Birth: March 25, 1961  Today's Date: 04/15/2018 OT Individual Time: 1345-1430 OT Individual Time Calculation (min): 45 min   Pt c/o LUE neuropathic pain; RN aware and kinesio tape applied Individual Therapy  Pt resting in w/c upon arrival and agreeable to therapy.  Pt amb without AD to Day Room for OT intervention with focus on BUE [facility] and strengthening.  Pt c/o second digit (R hand) neuropathic pain and swelling.  Kinesio tape applied.  Pt educated on purpose and precautions.  Pt issued soft Theraputty with beads and educated on HEP for [facility] and strengthening activities.  Pt completed activities independently with more than a reasonable amount of time.  Pt stated her R hand was not as painful.  Pt returned to room and remained in w/c with chair alarm activated.    Lavone Neri [facility] 04/15/2018, 2:35 PM

## 2018-04-15 NOTE — Progress Notes (Signed)
Physical Therapy Session Note  Patient Details  Name: Kelly Wall MRN: [id number] Date of Birth: September 10, 1961  Today's Date: 04/15/2018 PT Individual Time: 1300-1330 PT Individual Time Calculation (min): 30 min   Short Term Goals: Week 2:  PT Short Term Goal 1 (Week 2): Pt will perform bed mobility with Supervision assist PT Short Term Goal 2 (Week 2): Pt will ambulate x 200 ft with LRAD with Supervision assist consistently PT Short Term Goal 3 (Week 2): Pt will utilize UE hooking to roll in bed with supervision to increase independence with pressure relief.   Skilled Therapeutic Interventions/Progress Updates:   Focused on NMR to address higher level balance, coordination,and during functional mobility tasks and on Biodex. During gait on unit focused on coordination, balance, and cadence without AD with overall close supervision to min assist. Simulated laundry task with pt manipulating knobs (though states her knobs at home are much harder to turn and the space in her vertically stacked laundry area does not allow for much movement) and demonstrating functional dynamic standing balance with close supervision. Biodex for limits of stability and postural control activities on noncompliant and then compliant surface (level 10) to address coordination, balance, and postural control retraining especially focusing on controlled movement. Upon return to room, pt with incontinent bowel movement prior to being able to get into the bathroom. Transitioned to toilet and handoff to NT to assist.   Therapy Documentation Precautions:  Precautions Precautions: Fall, Cervical Required Braces or Orthoses: Cervical Brace Cervical Brace: Hard collar(donned in sitting) Restrictions Weight Bearing Restrictions: No  Pain: No complaints.   See Function Navigator for Current Functional Status.   Therapy/Group: Individual Therapy  Karolee Stamps Darrol Poke, PT, DPT  04/15/2018, 1:54 PM
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
## 2018-04-15 NOTE — Progress Notes (Signed)
Physical Therapy Session Note  Patient Details  Name: Kelly Wall MRN: [id number] Date of Birth: 1961-07-26  Today's Date: 04/15/2018 PT Individual Time: 0930-1035 PT Individual Time Calculation (min): 65 min   Short Term Goals: Week 2:  PT Short Term Goal 1 (Week 2): Pt will perform bed mobility with Supervision assist PT Short Term Goal 2 (Week 2): Pt will ambulate x 200 ft with LRAD with Supervision assist consistently PT Short Term Goal 3 (Week 2): Pt will utilize UE hooking to roll in bed with supervision to increase independence with pressure relief.   Skilled Therapeutic Interventions/Progress Updates:    d/c planning discussed in regards to home environment, tasks at home pt can perform independently or may need assist with, shower set-up, and energy conservation techniques/set-up techniques with support she does have available. Ex. Recommended putting lotions or shampoos into pump bottles vs squeeze bottles to increase independence, discussed laundry, and accessing clothing/items in the home. Engaged self care and dressing activities including pt navigating in room without RW with min assist to gather clothing from drawers (also discussed home set-up of closet),  Sit <> stands with supervision without AD for clothing management and dynamic standing balance, dressing, and oral/hand hygiene at sink as well as lipstick (pt able to manipulate container independently). NMR for gait training without AD to/from therapy with focus on gait pattern, cadence, and balance - supervision to occasional steadying assist on the way but more often min assist on the way back due to fatigue and mild LOBs. Stair negotiation with L rail only for home entry practice several repetitions (x16 steps) with steadying assist and improved technique approaching close supervision level. Issued HEP to address strength and balance for BLE exercises including Level A and B and pt performing ~ 5 reps each with cues for  technique. Handout left with pt (still need to finish going through 2 of the exercises).   Therapy Documentation Precautions:  Precautions Precautions: Fall, Cervical Required Braces or Orthoses: Cervical Brace Cervical Brace: Hard collar(donned in sitting) Restrictions Weight Bearing Restrictions: No   Pain:  Premedicated - no real complaints currently.    See Function Navigator for Current Functional Status.   Therapy/Group: Individual Therapy  Karolee Stamps Darrol Poke, PT, DPT  04/15/2018, 11:07 AM

## 2018-04-16 ENCOUNTER — Telehealth: Payer: Self-pay | Admitting: Physical Medicine & Rehabilitation

## 2018-04-16 ENCOUNTER — Inpatient Hospital Stay (HOSPITAL_COMMUNITY): Payer: 59

## 2018-04-16 ENCOUNTER — Inpatient Hospital Stay (HOSPITAL_COMMUNITY): Payer: 59 | Admitting: Physical Therapy

## 2018-04-16 NOTE — Progress Notes (Signed)
Physical Therapy Session Note  Patient Details  Name: Kelly Wall MRN: [id number] Date of Birth: 1961-03-14  Today's Date: 04/16/2018 PT Individual Time: 1430-1530 PT Individual Time Calculation (min): 60 min   Short Term Goals: Week 2:  PT Short Term Goal 1 (Week 2): Pt will perform bed mobility with Supervision assist PT Short Term Goal 2 (Week 2): Pt will ambulate x 200 ft with LRAD with Supervision assist consistently PT Short Term Goal 3 (Week 2): Pt will utilize UE hooking to roll in bed with supervision to increase independence with pressure relief.   Skilled Therapeutic Interventions/Progress Updates:    no c/o pain at rest but does note "warming" sensation in LEs with exercise.  Session focus on LE strengthening and NMR via ambulation and therex.    Pt transfers throughout session with supervision. Ambulation to therapy gym with close min guard, cues for attention to foot placement.  Once seated, pt notes swelling in toes.  PT provides pt with exercises to complete outside of therapy to reduce edema and pt demonstrates understanding.  Pt completes 1 trial to fatigue +12 reps step ups forward/sideways focus on eccentric control for NMR and strengthening.  Pt requires extended rest break between trials.  Also pt noted burning sensation in L cubital area.  Provided pt with visuals for UE dermatomes and encouraged her to speak with MD if sensation was bothersome.  Pt returned to room in w/c at end of session, positioned upright with call bell in reach and needs met.   Therapy Documentation Precautions:  Precautions Precautions: Fall, Cervical Required Braces or Orthoses: Cervical Brace Cervical Brace: Hard collar(donned in sitting) Restrictions Weight Bearing Restrictions: No   See Function Navigator for Current Functional Status.   Therapy/Group: Individual Therapy  Michel Santee 04/16/2018, 4:02 PM
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
## 2018-04-16 NOTE — Telephone Encounter (Signed)
FMLA PAPERWORK COMPLETED TO [employer] FOR REVIEW AND SIGNATURE

## 2018-04-16 NOTE — Patient Care Conference (Signed)
Inpatient RehabilitationTeam Conference and Plan of Care Update Date: 04/16/2018   Time: 2:05 PM    Patient Name: Kelly Wall      Medical Record Number: [id number]  Date of Birth: [birth year] Sex: Female         Room/Bed: 4W03C/4W03C-01 Payor Info: Payor: [employer] / Plan: [employer] / Product Type: *No Product type* /    Admitting Diagnosis: Cervical myelopathy  Admit Date/Time:  04/02/2018  5:13 PM Admission Comments: No comment available   Primary Diagnosis:  <principal problem not specified> Principal Problem: <principal problem not specified>  Patient Active Problem List   Diagnosis Date Noted  . Cervical myelopathy (HCC) 04/02/2018  . Bilateral arm weakness   . Numbness and tingling in both hands   . Paresthesia and pain of both upper extremities   . Trauma   . Post-operative pain   . Uncomplicated asthma   . Benign essential HTN   . Bradycardia   . Steroid-induced hyperglycemia   . 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 of cervical spinal cord (HCC) 03/30/2018  . S/P cervical spinal fusion 04/20/2016  . Cervical radicular pain 03/22/2016  . Hamstring tightness 12/11/2014  . 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 Pain in joint, upper arm 11/17/2014  . Labyrinthitis 05/15/2012  . Sinusitis acute 02/12/2012  . Well adult exam 06/27/2011  . Rash, skin 06/27/2011  . ADHESIVE CAPSULITIS, LEFT 11/09/2009  . SHOULDER PAIN 09/09/2009  . HAIR LOSS 07/24/2008  . ELEVATED BP 07/24/2008  . CARPAL TUNNEL SYNDROME 03/23/2008  . PARESTHESIA 03/23/2008  . MIGRAINE HEADACHE 08/02/2007    Expected Discharge Date: Expected Discharge Date: 04/23/18  Team Members Present: Physician leading conference: Dr. Faith Rogue Social Worker Present: Staci Acosta, LCSW Nurse Present: Other (comment)(Caroline Burchett, RN) PT Present: Teodoro Kil, PT OT Present: Ardis Rowan, Daneil Dolin, OT SLP Present: Colin Benton, SLP PPS Coordinator present : Edson Snowball,  PT     Current Status/Progress Goal Weekly Team Focus  Medical   Patient making progress from a motor standpoint.  Persistent sensory symptoms.  Pain is controlled.  Having problems with fit of cervical collar  Pain control, maximize mobility      Bowel/Bladder   continent of B/B; PVR BID; LBM 5/20  remain continent of b/b with normal pattern and emptying  assist with brp PRN; continue PVR   Swallow/Nutrition/ Hydration             ADL's   bathing/dressing-min A; functional tranfsers-steady A; toileting-mod A  supervision overall  BUE NMR, trunk control, BUE FMC, ADL retraining, functional transfers, education   Mobility   min guard without AD for ambulation, progressing well with LE strength/coordination, slow with UE coordination  supervision for out of bed  LE and UE NMR, balance, activity tolerance, all functional mobility    Communication             Safety/Cognition/ Behavioral Observations            Pain   Pain controlled with current pain regimen; oxycodone prn, tylenol prn, baclofen prn  pain <4/10  assess pain qshift and PRN; medicate PRN and assess for relief   Skin   surgical incision to anterior neck  healing without any s/s of infection, skin free from breakdown  assess q shift and prn    Rehab Goals Patient on target to meet rehab goals: Yes Rehab Goals Revised: none *See Care Plan and progress notes for long and short-term goals.  Barriers to Discharge  Current Status/Progress Possible Resolutions Date Resolved   Physician    Medical stability        Continue with medical plan as outlined in the chart      Nursing                  PT                    OT                  SLP                SW                Discharge Planning/Teaching Needs:  Pt plans to go to her mother's home at d/c where her family can provide supervision, as needed.  Pt's family will be offered family education closer to d/c, if needed.   Team Discussion:  Pt is doing  better neurologically and her pain is decreasing.  Her collar has been adjusted.  Pt's bowel and bladder are progressing and MD is backing off medications for these.  Pt's hands are slow to improve, but are coming around.  Pt doing well per nurse.  Pt is making good accommodations with hands and she is regaining some strength.  Pt is min guard without a rolling walker and goals are supervision without a device and she will reach these.  PT is working on both upper and lower body extremity strengthening.   Revisions to Treatment Plan:  none    Continued Need for Acute Rehabilitation Level of Care: The patient requires daily medical management by a physician with specialized training in physical medicine and rehabilitation for the following conditions: Daily direction of a multidisciplinary physical rehabilitation program to ensure safe treatment while eliciting the highest outcome that is of practical value to the patient.: Yes Daily medical management of patient stability for increased activity during participation in an intensive rehabilitation regime.: Yes Daily analysis of laboratory values and/or radiology reports with any subsequent need for medication adjustment of medical intervention for : Post surgical problems;Neurological problems  Kelly Wall, [place] 04/16/2018, 2:14 PM

## 2018-04-16 NOTE — Progress Notes (Signed)
Physical Therapy Session Note  Patient Details  Name: Kelly Wall MRN: [id number] Date of Birth: [birth year]  Today's Date: 04/16/2018 PT Individual Time: 1300-1411 PT Individual Time Calculation (min): 71 min   Short Term Goals: Week 2:  PT Short Term Goal 1 (Week 2): Pt will perform bed mobility with Supervision assist PT Short Term Goal 2 (Week 2): Pt will ambulate x 200 ft with LRAD with Supervision assist consistently PT Short Term Goal 3 (Week 2): Pt will utilize UE hooking to roll in bed with supervision to increase independence with pressure relief.   Skilled Therapeutic Interventions/Progress Updates:    Pt using bathroom upon PT arrival, agreeable to therapy tx and reports pain 6/10 in L UE. Pt ambulated from toilet>sink with RW and supervision to wash hands. NMR for gait training without AD to/from therapy with focus on gait pattern, cadence, and balance - supervision to occasional steadying assist. Pt performed LE exercises for NMR including sidestepping with theraband for resistance, squats with theraband to emphasize hip abduction, DF on R and  added resistance using theraband for L ankle, calf raises in standing, hamstring curls with theraband. Pt worked on dynamic standing balance to perform toe taps, sit<>stands with on LE on dynadisc and on airex with manual perturbations. Pt ambulated back to room and left seated in w/c with needs in reach.      Therapy Documentation Precautions:  Precautions Precautions: Fall, Cervical Required Braces or Orthoses: Cervical Brace Cervical Brace: Hard collar(donned in sitting) Restrictions Weight Bearing Restrictions: No   See Function Navigator for Current Functional Status.   Therapy/Group: Individual Therapy  Cresenciano Genre, PT, DPT 04/16/2018, 12:54 PM

## 2018-04-16 NOTE — Progress Notes (Signed)
Occupational Therapy Session Note  Patient Details  Name: Kelly Wall MRN: [id number] Date of Birth: 1961/09/29  Today's Date: 04/16/2018 OT Individual Time: 0930-1100 OT Individual Time Calculation (min): 90 min    Short Term Goals: Week 2:  OT Short Term Goal 1 (Week 2): Pt will complete 3/3 toileting tasks with steadying assist OT Short Term Goal 2 (Week 2): Pt will complete 2 grooming tasks using AE PRN with assist for set-up OT Short Term Goal 3 (Week 2): Pt will don B shoes with min A  Skilled Therapeutic Interventions/Progress Updates:    OT intervention with focus on BADL retraining including bathing at shower level, toileting, and dressing with sit<>stand from w/c at sink.  Pt amb without AD (supervision) to bathroom and completed tasks at min A level.  Pt required assistance to wash hair. Pt required assistance to pull skirt over hips (tight fitting). Pt required assistance to don socks but was able to don shoes.  Pt amb to Day Room and engaged in BUE FMC/strengthening tasks seated at table.  Pt noted with increased strength and manipulation skills. Pt returned to room and remained in w/c with chair alarm activated.  Therapy Documentation Precautions:  Precautions Precautions: Fall, Cervical Required Braces or Orthoses: Cervical Brace Cervical Brace: Hard collar(donned in sitting) Restrictions Weight Bearing Restrictions: No  Pain:  Pt c/o increased LUE neuropathic pain; RN aware and meds admin  See Function Navigator for Current Functional Status.   Therapy/Group: Individual Therapy  Rich Brave 04/16/2018, 12:15 PM

## 2018-04-17 ENCOUNTER — Inpatient Hospital Stay (HOSPITAL_COMMUNITY): Payer: 59

## 2018-04-17 ENCOUNTER — Inpatient Hospital Stay (HOSPITAL_COMMUNITY): Payer: 59 | Admitting: Physical Therapy

## 2018-04-17 NOTE — Progress Notes (Signed)
Occupational Therapy Note  Patient Details  Name: Kelly Wall MRN: [id number] Date of Birth: [birth year]  Today's Date: 04/17/2018 OT Individual Time: 1345-1430 OT Individual Time Calculation (min): 45 min   Pt c/o ongoing LUE neuropathic pain but improved since AM session; RN aware Individual Therapy  Pt resting in w/c upon arrival and agreeable to participating in therapy.  OT intervention with focus on functional amb without AD, BLE/BUE therex on Nustep (8 mins @ load 7), FMC tasks with coins, and safety awareness to increase independence with BADLs.  Pt amb without AD to gather horseshoes from railings and carry to gym from Day room. Pt engaged in [facility] table tasks with coins including grasp and in-hand manipulation tasks.  Pt amb back to room and requested to return to bed.  Pt remained in bed with all needs within reach and bed alarm activated.    Lavone Neri [facility] 04/17/2018, 2:50 PM

## 2018-04-17 NOTE — Progress Notes (Signed)
Physical Therapy Session Note  Patient Details  Name: Kelly Wall MRN: [id number] Date of Birth: 1961/06/21  Today's Date: 04/17/2018 PT Individual Time: 1000-1100 PT Individual Time Calculation (min): 60 min   Short Term Goals: Week 2:  PT Short Term Goal 1 (Week 2): Pt will perform bed mobility with Supervision assist PT Short Term Goal 2 (Week 2): Pt will ambulate x 200 ft with LRAD with Supervision assist consistently PT Short Term Goal 3 (Week 2): Pt will utilize UE hooking to roll in bed with supervision to increase independence with pressure relief.   Skilled Therapeutic Interventions/Progress Updates:    no c/o pain at rest, does indicate some discomfort in B hands with use.  Session focus on fine motor coordination/strength in hands, and strength/NMR in LEs.    Pt transfers throughout session with supervision.  Ambulation without device throughout session with close supervision, 1 episode of LOB which pt self corrects.  UE task retrieving beads from yellow theraputty with increased time and cues for use of fine motor coordination to complete task.  LE strengthening with heel/toe raises and minisquats x15 reps with UE support on chair back.  LE strengthening/NMR with nustep at level 4 with all 4 extremities, focus on maintaining UE grasp and reciprocal stepping pattern retraining.  Pt returned to room in w/c at end of session with UEs propelling for strengthening and cardiovascular endurance.  Pt left upright in w/c with call bell in reach and needs met.   Therapy Documentation Precautions:  Precautions Precautions: Fall, Cervical Required Braces or Orthoses: Cervical Brace Cervical Brace: Hard collar(donned in sitting) Restrictions Weight Bearing Restrictions: No   See Function Navigator for Current Functional Status.   Therapy/Group: Individual Therapy  Michel Santee 04/17/2018, 3:59 PM
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
## 2018-04-17 NOTE — Progress Notes (Signed)
Occupational Therapy Session Note  Patient Details  Name: Kelly Wall MRN: [id number] Date of Birth: 1961/02/19  Today's Date: 04/17/2018 OT Individual Time: 0800-0930 OT Individual Time Calculation (min): 90 min    Short Term Goals: Week 2:  OT Short Term Goal 1 (Week 2): Pt will complete 3/3 toileting tasks with steadying assist OT Short Term Goal 2 (Week 2): Pt will complete 2 grooming tasks using AE PRN with assist for set-up OT Short Term Goal 3 (Week 2): Pt will don B shoes with min A  Skilled Therapeutic Interventions/Progress Updates:    Pt resting in w/c upon arrival.  Pt requested to use toilet and change clothing.  Pt declined shower this morning.  Pt amb without AD into bathroom (supervision) for toileting and changing clothing.  Pt was able to pull skirt over hips but required assistance pulling zipper.  Pt returned to room and donned socks and shoes.  Pt able to pull socks over toes and partially over heels but required assistance to complete task.  Pt able to don shoes without assistance.  Pt amb to ADL apartment and replaced sheet and comforter on bed, requiring more than a reasonable amount of time.  Pt amb to Day Room and engaged in B hand strengthening activities with super soft Theraputty before returning to room.  Pt remained in w/c with chair alarm activated and all needs within reach.   Therapy Documentation Precautions:  Precautions Precautions: Fall, Cervical Required Braces or Orthoses: Cervical Brace Cervical Brace: Hard collar(donned in sitting) Restrictions Weight Bearing Restrictions: No   Pain: Pain Assessment Pain Scale: 0-10 Pain Score: 7  Pain Type: Acute pain Pain Location: Arm Pain Orientation: Left Pain Descriptors / Indicators: Aching Pain Frequency: Intermittent Patients Stated Pain Goal: 2 Pain Intervention(s): Medication (See eMAR) Multiple Pain Sites: Yes 2nd Pain Site Pain Score: 7 Pain Type: Surgical pain Pain Location:  Neck Pain Orientation: Anterior;Right Pain Descriptors / Indicators: Aching Pain Frequency: Intermittent Pain Onset: On-going Patient's Stated Pain Goal: 2 Pain Intervention(s): Medication (See eMAR)  See Function Navigator for Current Functional Status.   Therapy/Group: Individual Therapy  Rich Brave 04/17/2018, 10:32 AM

## 2018-04-18 ENCOUNTER — Inpatient Hospital Stay (HOSPITAL_COMMUNITY): Payer: 59 | Admitting: Physical Therapy

## 2018-04-18 ENCOUNTER — Inpatient Hospital Stay (HOSPITAL_COMMUNITY): Payer: 59

## 2018-04-18 NOTE — Progress Notes (Signed)
Occupational Therapy Session Note  Patient Details  Name: YEVETTE KNUST MRN: [id number] Date of Birth: 1961-04-11  Today's Date: 04/18/2018 OT Individual Time: 0800-0930 OT Individual Time Calculation (min): 90 min    Short Term Goals: Week 3:  OT Short Term Goal 1 (Week 3): STG=LTG secondary to ELOS  Skilled Therapeutic Interventions/Progress Updates:    OT intervention with initial focus on bathing at shower level and dressing with sit<>stand from w/c to increase independence with BADLs.  Pt amb without AD to bathroom and used toilet prior to transfer to shower.  Pt completed all tasks in bathroom at supervision level.  Pt returned to room to complete dressing tasks.  Pt able to don shoes without assistance.  Pt completed grooming tasks while standing at sink.  Pt amb to gym and engaged in [facility] tasks including removing bottle caps from medicine bottles. Pt returned to room and remained in w/c with all needs within reach and chair alarm activated.   Therapy Documentation Precautions:  Precautions Precautions: Fall, Cervical Required Braces or Orthoses: Cervical Brace Cervical Brace: Hard collar(donned in sitting) Restrictions Weight Bearing Restrictions: No Pain:  PT c/o 4/10 pain in LUE this morning; RN aware  See Function Navigator for Current Functional Status.   Therapy/Group: Individual Therapy  Rich Brave 04/18/2018, 11:27 AM
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
## 2018-04-18 NOTE — Progress Notes (Signed)
Occupational Therapy Note  Patient Details  Name: Kelly Wall MRN: [id number] Date of Birth: 01/17/1961  Today's Date: 04/18/2018 OT Individual Time: 1300-1345 OT Individual Time Calculation (min): 45 min   Pt c/o 4/10 pain in LUE; RN aware Individual Therapy  OT intervention with focus on toileting at supervision level, changing clothing with min A for assistance with buttons, functional amb for simple home mgmt tasks (supervision), and BUE/BLE therex on NuStep (8 mins at load 7). Pt also engaged in grooming tasks while standing at sink.  Pt returned to room and requested to use toilet again.  Pt remained on toilet with instructions to pull staff alarm (RN and NT notified.)  Rich Brave 04/18/2018, 2:55 PM

## 2018-04-18 NOTE — Progress Notes (Signed)
Physical Therapy Session Note  Patient Details  Name: Kelly Wall MRN: [id number] Date of Birth: 08/09/1961  Today's Date: 04/18/2018 PT Individual Time: 1000-1100 PT Individual Time Calculation (min): 60 min   Short Term Goals: Week 2:  PT Short Term Goal 1 (Week 2): Pt will perform bed mobility with Supervision assist PT Short Term Goal 2 (Week 2): Pt will ambulate x 200 ft with LRAD with Supervision assist consistently PT Short Term Goal 3 (Week 2): Pt will utilize UE hooking to roll in bed with supervision to increase independence with pressure relief.   Skilled Therapeutic Interventions/Progress Updates:    no c/o pain.  Session focus on activity tolerance, strengthening, and UE/LE NMR.  Pt ambulates throughout session without device and close supervision, cues for increased R step length and heel strike.  Transfers throughout session mod I.  Pt completes floor transfer with min assist to power up from floor.  Education provided on falls recovery and pt verbalized understanding.  Would benefit from another trial for pt confidence.  UE [place] with zipper/button task during seated rest break.  Pt completes standing card matching on foam and foam wedge for ankle/hip strategy retraining, LE strengthening, balance, and UE coordination.  Car transfer at end of session with supervision.  Pt returned to room and positioned in w/c with call bell in reach and needs met.   Therapy Documentation Precautions:  Precautions Precautions: Fall, Cervical Required Braces or Orthoses: Cervical Brace Cervical Brace: Hard collar(donned in sitting) Restrictions Weight Bearing Restrictions: No   See Function Navigator for Current Functional Status.   Therapy/Group: Individual Therapy  Michel Santee 04/18/2018, 11:02 AM

## 2018-04-18 NOTE — Plan of Care (Signed)
  Problem: RH PAIN MANAGEMENT Goal: RH STG PAIN MANAGED AT OR BELOW PT'S PAIN GOAL Description Pain level < 3 on pain scale  Outcome: Progressing  Continues to administered pain regimen

## 2018-04-18 NOTE — Progress Notes (Signed)
Occupational Therapy Weekly Progress Note  Patient Details  Name: Kelly Wall MRN: [id number] Date of Birth: 03-May-1961  Beginning of progress report period: Apr 11, 2018 End of progress report period: Apr 18, 2018  Patient has met 3 of 3 short term goals. Pt has made steady progress with BADLs during the past week.  Pt amb without AD at supervision level and performs all functional transfers at supervision level.  Pt completes toileting tasks at supervision level.  Pt requires assistance donning socks and fastening buttons/zippers but is able to complete all other bathing/dressing tasks without assistance.  Pt incorporates appropriate compensatory strategies when completing functional tasks secondary to ongoing limited B hand dexterity/strength.  Patient continues to demonstrate the following deficits:abnormal posture, acute pain and tetraplegia at level C5  and therefore will continue to benefit from skilled OT intervention to enhance overall performance with BADL, iADL and Reduce care partner burden.  Patient progressing toward long term goals..  Continue plan of care.  OT Short Term Goals Week 2:  OT Short Term Goal 1 (Week 2): Pt will complete 3/3 toileting tasks with steadying assist OT Short Term Goal 1 - Progress (Week 2): Met OT Short Term Goal 2 (Week 2): Pt will complete 2 grooming tasks using AE PRN with assist for set-up OT Short Term Goal 2 - Progress (Week 2): Met OT Short Term Goal 3 (Week 2): Pt will don B shoes with min A OT Short Term Goal 3 - Progress (Week 2): Met Week 3:  OT Short Term Goal 1 (Week 3): STG=LTG secondary to ELOS   Therapy Documentation Precautions:  Precautions Precautions: Fall, Cervical Required Braces or Orthoses: Cervical Brace Cervical Brace: Hard collar(donned in sitting) Restrictions Weight Bearing Restrictions: No  See Function Navigator for Current Functional Status.     Leotis Shames [facility] 04/18/2018, 6:55 AM

## 2018-04-19 ENCOUNTER — Inpatient Hospital Stay (HOSPITAL_COMMUNITY): Payer: 59

## 2018-04-19 ENCOUNTER — Inpatient Hospital Stay (HOSPITAL_COMMUNITY): Payer: 59 | Admitting: Physical Therapy

## 2018-04-19 NOTE — Progress Notes (Signed)
Social Work Patient ID: Kelly Wall, female   DOB: March 23, 1961, 57 y.o.   MRN: [id number]   CSW met with pt 04-17-18 to update her on team conference discussion and that targeted d/c date remains 04-23-18.  Pt is pleased with this and her dtr is coming from [place] to be with her for a week or so.  CSW has assisted pt with paperwork for FMLA and short term disability and now pt has [employer] paperwork to complete.  PA has done provider part and CSW or pt's family will help her complete pt's portion, as her hands will not allow her to write.  Pt is to go for outpt therapy and CSW has this arranged.  CSW also ordered tub transfer bench.  CSW will continue to follow and assist as needed.

## 2018-04-19 NOTE — Progress Notes (Signed)
Occupational Therapy Session Note  Patient Details  Name: Kelly Wall MRN: [id number] Date of Birth: [birth year]  Today's Date: 04/19/2018 OT Individual Time: 0830-1000 OT Individual Time Calculation (min): 90 min    Short Term Goals: Week 3:  OT Short Term Goal 1 (Week 3): STG=LTG secondary to ELOS  Skilled Therapeutic Interventions/Progress Updates:    Pt engaged in BADL retraining including bathing at shower level, dressing with sit<>stand from w/c, and grooming while standing at sink.  Pt completed all bathing/dressing tasks without assistance this morning.  Pt completed toileting tasks without assistance.  Pt requires more than a reasonable amount of time to complete tasks.  Pt amb without AD to gym and engaged in BUE [facility] tasks with and nine hole peg test with marked improvement: R-40.95 secs and L-55.17 secs.  Pt returned to room and remained in w/c with all needs within reach.   Therapy Documentation Precautions:  Precautions Precautions: Fall, Cervical Required Braces or Orthoses: Cervical Brace Cervical Brace: Hard collar(donned in sitting) Restrictions Weight Bearing Restrictions: No  Pain:  Pt c/o increased neuropathic pain in LUE; RN aware and meds admin near end of session  See Function Navigator for Current Functional Status.   Therapy/Group: Individual Therapy  Rich Brave 04/19/2018, 3:18 PM

## 2018-04-19 NOTE — Discharge Instructions (Signed)
Inpatient Rehab Discharge Instructions  Kelly Wall Discharge date and time: No discharge date for patient encounter.   Activities/Precautions/ Functional Status: Activity: Cervical collar when out of bed or sitting up Diet: regular diet Wound Care: keep wound clean and dry Functional status:  ___ No restrictions     ___ Walk up steps independently ___ 24/7 supervision/assistance   ___ Walk up steps with assistance ___ Intermittent supervision/assistance  ___ Bathe/dress independently ___ Walk with walker     _x__ Bathe/dress with assistance ___ Walk Independently    ___ Shower independently ___ Walk with assistance    ___ Shower with assistance ___ No alcohol     ___ Return to work/school ________  COMMUNITY REFERRALS UPON DISCHARGE:   Outpatient: PT     OT  Agency:  [facility]                            [address]                            [place], [place]  [address]  Phone:  [phone]  Appointment Date/Time:  04-30-18 at 10:15 AM OT (arrive at 10 AM) and 11 AM PT Medical Equipment/Items Ordered:  Tub transfer bench  Agency/Supplier:  [facility]     Phone:  [phone]  Special Instructions: No driving   My questions have been answered and I understand these instructions. I will adhere to these goals and the provided educational materials after my discharge from the hospital.  Patient/Caregiver Signature _______________________________ Date __________  Clinician Signature _______________________________________ Date __________  Please bring this form and your medication list with you to all your follow-up doctor's appointments.

## 2018-04-19 NOTE — Progress Notes (Signed)
Physical Therapy Session Note  Patient Details  Name: Kelly Wall MRN: [id number] Date of Birth: 02-05-1961  Today's Date: 04/19/2018 PT Individual Time: 1345-1425 PT Individual Time Calculation (min): 40 min   Short Term Goals: Week 3:  PT Short Term Goal 1 (Week 3): =LTGs due to ELOS  Skilled Therapeutic Interventions/Progress Updates:    Pt seated in w/c in room, agreeable to PT. No complaints of pain. Ambulation x 15 ft from w/c to bathroom with no AD and Supervision. Toilet transfer with Supervision, pt is independent for 3/3 toileting steps. Ambulation x 1000 ft with no AD and Supervision while navigating functional environment from rehab floor down to gift shop. Pt is able to navigate elevator buttons independently and ambulate through narrow cluttered spaces and aisles in gift shop with close Supervision. Pt occasionally has decreased RLE clearance with gait when distracted but has no LOB. With onset of fatigue pt has increase in ataxia but is able to maintain balance with gait. Pt left seated in w/c in room with needs in reach at end of therapy session.  Therapy Documentation Precautions:  Precautions Precautions: Fall, Cervical Required Braces or Orthoses: Cervical Brace Cervical Brace: Hard collar(donned in sitting) Restrictions Weight Bearing Restrictions: No   See Function Navigator for Current Functional Status.   Therapy/Group: Individual Therapy  Peter Congo, PT, DPT  04/19/2018, 2:38 PM

## 2018-04-19 NOTE — Progress Notes (Signed)
Physical Therapy Weekly Progress Note  Patient Details  Name: Kelly Wall MRN: [id number] Date of Birth: July 15, 1961  Beginning of progress report period: Apr 11, 2018 End of progress report period: Apr 19, 2018  Today's Date: 04/19/2018 PT Individual Time: 1000-1100 PT Individual Time Calculation (min): 60 min   Patient has met 3 of 3 short term goals.  Pt continues to make excellent progress with therapy.  She is ambulating with close supervision without an AD.  Patient continues to demonstrate the following deficits muscle weakness and muscle paralysis, impaired timing and sequencing, abnormal tone, unbalanced muscle activation and decreased coordination and decreased standing balance, decreased postural control and decreased balance strategies and therefore will continue to benefit from skilled PT intervention to increase functional independence with mobility.  Patient progressing toward long term goals..  Continue plan of care.  PT Short Term Goals Week 2:  PT Short Term Goal 1 (Week 2): Pt will perform bed mobility with Supervision assist PT Short Term Goal 1 - Progress (Week 2): Met PT Short Term Goal 2 (Week 2): Pt will ambulate x 200 ft with LRAD with Supervision assist consistently PT Short Term Goal 2 - Progress (Week 2): Met PT Short Term Goal 3 (Week 2): Pt will utilize UE hooking to roll in bed with supervision to increase independence with pressure relief.  PT Short Term Goal 3 - Progress (Week 2): Met Week 3:  PT Short Term Goal 1 (Week 3): =LTGs due to ELOS  Skilled Therapeutic Interventions/Progress Updates:    no c/o pain.  Session focus on community level ambulation for strengthening, activity tolerance, and NMR.    Pt ambulates on/off unit (outside) without AD and up to close supervision on even surfaces, min guard on grass.  Pt ambulates on/off elevators, up/down inclines, up/down stairs, on grass, gravel, mulch, and brick, all with close supervision/min guard.   PT and pt discussed upcoming discharge and precautions for return to PLOF throughout session.  Pt returned to room in w/c at end of session for time management.   Therapy Documentation Precautions:  Precautions Precautions: Fall, Cervical Required Braces or Orthoses: Cervical Brace Cervical Brace: Hard collar(donned in sitting) Restrictions Weight Bearing Restrictions: No   See Function Navigator for Current Functional Status.  Therapy/Group: Individual Therapy  Michel Santee 04/19/2018, 11:08 AM

## 2018-04-19 NOTE — Progress Notes (Signed)
Physical Therapy Session Note  Patient Details  Name: Kelly Wall MRN: [id number] Date of Birth: 1961-06-20  Today's Date: 04/19/2018 PT Individual Time: 1501-1600 PT Individual Time Calculation (min): 59 min   Short Term Goals: Week 2:  PT Short Term Goal 1 (Week 2): Pt will perform bed mobility with Supervision assist PT Short Term Goal 2 (Week 2): Pt will ambulate x 200 ft with LRAD with Supervision assist consistently PT Short Term Goal 3 (Week 2): Pt will utilize UE hooking to roll in bed with supervision to increase independence with pressure relief.   Skilled Therapeutic Interventions/Progress Updates:    Pt seated in w/c upon PT arrival, agreeable to therapy tx and reports pain 3/10 in L arm. Pt ambulated from w/c<>bathroom with supervision, performed toileting with supervision. Pt ambulated from room>gym x 200 ft CGA with verbal cues for increased step length. Pt ambulated throughout rehab apartment working on reaching for items in the kitchen, opening cabinets and opening the fridge. Pt problem solving how to hold dishes and open containers. Pt performed transfer on/off full size bed in rehab apartment with supervision and verbal cues for techniques/precautions. Pt performed furniture transfer on couch with supervision. Pt ambulated to gym and ascended/descended 12 steps with rails and CGA for strengthening. Pt ambulated back to room with CGA, left supine in bed with needs in reach.    Therapy Documentation Precautions:  Precautions Precautions: Fall, Cervical Required Braces or Orthoses: Cervical Brace Cervical Brace: Hard collar(donned in sitting) Restrictions Weight Bearing Restrictions: No   See Function Navigator for Current Functional Status.   Therapy/Group: Individual Therapy  Cresenciano Genre, PT, DPT 04/19/2018, 7:52 AM

## 2018-04-20 ENCOUNTER — Inpatient Hospital Stay (HOSPITAL_COMMUNITY): Payer: 59 | Admitting: Physical Therapy

## 2018-04-20 DIAGNOSIS — G8250 Quadriplegia, unspecified: Secondary | ICD-10-CM
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
## 2018-04-20 NOTE — Plan of Care (Signed)
  Problem: RH SAFETY Goal: RH STG ADHERE TO SAFETY PRECAUTIONS W/ASSISTANCE/DEVICE Description STG Adhere to Safety Precautions With Assistance/Device Supervision.  Outcome: Progressing  Continue to use proper footwear, call light(soft pad) within reach

## 2018-04-20 NOTE — Progress Notes (Signed)
Physical Therapy Session Note  Patient Details  Name: Kelly Wall MRN: [id number] Date of Birth: 13-Oct-1961  Today's Date: 04/20/2018 PT Individual Time: 9718-2099 PT Individual Time Calculation (min): 30 min   Short Term Goals: Week 3:  PT Short Term Goal 1 (Week 3): =LTGs due to ELOS  Skilled Therapeutic Interventions/Progress Updates:   Pt received sitting in WC and agreeable to PT. Pt transported to main entrance in [place]. Gait training over unlevel cement dsidewalk and up/down handicar ramp to parking deck with distant supervision assist from PT and no AD x [phone]. Gait training also performed in hall of rehab unit x [phone] AD and distant supervision assist. Min cues through gait for improved heel contact and step length on the R. Patient returned to [facility] and [facility] reach and all needs met.        Therapy Documentation Precautions:  Precautions Precautions: Fall, Cervical Required Braces or Orthoses: Cervical Brace Cervical Brace: Hard collar(donned in sitting) Restrictions Weight Bearing Restrictions: No Pain: Pain Assessment Pain Scale: 0-10 Pain Score: 0-No pain   See Function Navigator for Current Functional Status.   Therapy/Group: Individual Therapy  AuLorie Phenix/25/2019, 9:52 AM

## 2018-04-21 ENCOUNTER — Inpatient Hospital Stay (HOSPITAL_COMMUNITY): Payer: 59 | Admitting: Occupational Therapy
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
## 2018-04-21 NOTE — Progress Notes (Signed)
Occupational Therapy Session Note  Patient Details  Name: Kelly Wall MRN: [id number] Date of Birth: Feb 26, 1961  Today's Date: 04/21/2018 OT Individual Time: 1045-1130 OT Individual Time Calculation (min): 45 min    Short Term Goals: Week 3:  OT Short Term Goal 1 (Week 3): STG=LTG secondary to ELOS  Skilled Therapeutic Interventions/Progress Updates:    Pt seen for OT ADL bathing/dressing session. Pt sitting up in w/c upon arrival, agreeable to tx session and desiring to take shower. She had already gathered clothing items. She ambulated throughout room without AD, close supervision and VCs not to reach out for furniture for support when ambulating. She bathed seated on tub bench, exicited as she was now able to reach behind head to wash hair and reach across to wash back, improvements from previous sessions.  She returned to w/c to dress, threading on button down shirt. With significantly increased time, she was able to button small buttons with shirt donned.  At end of session, pt desired to don socks independently without assist from OT. Pt left seated in w/c to don socks with visitor present, all needs in reach.    Therapy Documentation Precautions:  Precautions Precautions: Fall, Cervical Required Braces or Orthoses: Cervical Brace Cervical Brace: Hard collar(donned in sitting) Restrictions Weight Bearing Restrictions: No Pain: Pain Assessment Pain Scale: 0-10 Pain Score: No/denies pain ADL: ADL ADL Comments: refer to functional navigator  See Function Navigator for Current Functional Status.   Therapy/Group: Individual Therapy  [name] L 04/21/2018, 7:05 AM

## 2018-04-22 ENCOUNTER — Inpatient Hospital Stay (HOSPITAL_COMMUNITY): Payer: 59 | Admitting: Physical Therapy

## 2018-04-22 ENCOUNTER — Inpatient Hospital Stay (HOSPITAL_COMMUNITY): Payer: 59

## 2018-04-22 DIAGNOSIS — S14125S 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 of cervical spinal cord, sequela: Secondary | ICD-10-CM

## 2018-04-22 NOTE — Progress Notes (Signed)
Occupational Therapy Session Note  Patient Details  Name: Kelly Wall MRN: [id number] Date of Birth: 1961/09/14  Today's Date: 04/22/2018 OT Individual Time: 0930-1000 OT Individual Time Calculation (min): 30 min    Short Term Goals: Week 3:  OT Short Term Goal 1 (Week 3): STG=LTG secondary to ELOS  Skilled Therapeutic Interventions/Progress Updates:    Pt sitting up in w/c agreeable to therapy and eager to participate. (S) provided during 150 ft of functional mobility x2, with 1 LOB requiring min A to correct balance. Pt c/o stiffness in B hips and knees. Pt completed 8 min on NuStep on level 7 resistance to promote overall endurance and reciprocal stepping pattern, as well as increase B LE muscle elasticity. Vc provided throughout to increase pace for added challenge. Pt returned to room and was left in w/c with all needs met.   Therapy Documentation Precautions:  Precautions Precautions: Fall, Cervical Required Braces or Orthoses: Cervical Brace Cervical Brace: Hard collar(applied in sitting) Restrictions Weight Bearing Restrictions: No Pain: Pain Assessment Pain Scale: 0-10 Pain Score: 0-No pain Pain Type: Acute pain Pain Location: Arm Pain Orientation: Right;Left Pain Descriptors / Indicators: Aching Pain Frequency: Intermittent Pain Onset: On-going Patients Stated Pain Goal: 1 Pain Intervention(s): Medication (See eMAR) ADL: ADL ADL Comments: refer to functional navigator  See Function Navigator for Current Functional Status.   Therapy/Group: Individual Therapy  Curtis Sites 04/22/2018, 11:39 AM
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
## 2018-04-22 NOTE — Progress Notes (Signed)
Physical Therapy Session Note  Patient Details  Name: Kelly Wall MRN: [id number] Date of Birth: 03/16/1961  Today's Date: 04/22/2018 PT Individual Time: 1422-1516 PT Individual Time Calculation (min): 54 min   Short Term Goals: Week 3:  PT Short Term Goal 1 (Week 3): =LTGs due to ELOS  Skilled Therapeutic Interventions/Progress Updates:  Pt received in w/c & agreeable to tx, no c/o pain. Pt reports she wants to work on walking. Pt completes transfers and ambulates throughout unit without AD & close supervision. Pt does demonstrates decreased stride length & step length BLE, decreased gait speed, decreased heel strike RLE. During gait pt reports difficulty relaxing shoulders and observing LLE is stronger than RLE - pt with good awareness regarding deficits. Pt completed Berg Balance Test & scored 40/56; educated pt on interpretation of score & current fall risk. Patient demonstrates increased fall risk as noted by score of 40/56 on Berg Balance Scale.  (<36= high risk for falls, close to 100%; 37-45 significant >80%; 46-51 moderate >50%; 52-55 lower >25%). Pt negotiated 8 steps with L rail only to simulate home entry; therapist provides cuing to descend stairs laterally and with BUE support on rail with pt requiring min assist progressing to close supervision with task. Pt utilized cybex kinetron in sitting progressing to standing with BUE support with task focusing on BLE strengthening. At end of session pt left sitting in w/c in room in care of RN.    Therapy Documentation Precautions:  Precautions Precautions: Fall, Cervical Required Braces or Orthoses: Cervical Brace Cervical Brace: Hard collar(applied in sitting) Restrictions Weight Bearing Restrictions: No  Balance: Balance Balance Assessed: Yes Standardized Balance Assessment Standardized Balance Assessment: Berg Balance Test Berg Balance Test Sit to Stand: Able to stand without using hands and stabilize  independently Standing Unsupported: Able to stand safely 2 minutes Sitting with Back Unsupported but Feet Supported on Floor or Stool: Able to sit safely and securely 2 minutes Stand to Sit: Sits safely with minimal use of hands Transfers: Able to transfer safely, minor use of hands Standing Unsupported with Eyes Closed: Able to stand 10 seconds safely Standing Ubsupported with Feet Together: Able to place feet together independently and stand for 1 minute with supervision From Standing, Reach Forward with Outstretched Arm: Reaches forward but needs supervision(reaches forward 5" with supervision) From Standing Position, Pick up Object from Floor: Able to pick up shoe, needs supervision From Standing Position, Turn to Look Behind Over each Shoulder: Turn sideways only but maintains balance(also limited by cervical precautions) Turn 360 Degrees: Needs close supervision or verbal cueing Standing Unsupported, Alternately Place Feet on Step/Stool: Able to complete 4 steps without aid or supervision(completes 8 steps with close supervision) Standing Unsupported, One Foot in Front: Able to plae foot ahead of the other independently and hold 30 seconds Standing on One Leg: Tries to lift leg/unable to hold 3 seconds but remains standing independently Total Score: 40   See Function Navigator for Current Functional Status.   Therapy/Group: Individual Therapy  Sandi Mariscal 04/22/2018, 3:18 PM

## 2018-04-22 NOTE — Progress Notes (Signed)
Occupational Therapy Session Note  Patient Details  Name: Kelly Wall MRN: [id number] Date of Birth: 10-Apr-1961  Today's Date: 04/22/2018 OT Individual Time: 0700-0830 OT Individual Time Calculation (min): 90 min    Short Term Goals: Week 3:  OT Short Term Goal 1 (Week 3): STG=LTG secondary to ELOS  Skilled Therapeutic Interventions/Progress Updates:    OT intervention with focus on continued BADL retraining including bathing at shower level, toileting, dressing with sit<>stand from w/c, grooming at sink in standing, and selecting clothing from dresser drawers. Pt completed all tasks at supervision+ level.  Pt amb without AD to gym and engaged in BUE/hand strengthening/FMC tasks.  Pt completed 9 hole peg test with following resulting (improvement: L-35.85 secs and R-27.75 secs. Pt incorporates appropriate compensatory strategies for functional tasks. Pt returned to room and remained in w/c with all needs within reach.   Therapy Documentation Precautions:  Precautions Precautions: Fall, Cervical Required Braces or Orthoses: Cervical Brace Cervical Brace: Hard collar(applied in sitting) Restrictions Weight Bearing Restrictions: No Pain: Pain Assessment Pain Scale: 0-10 Pain Score: 0-No pain Pain Type: Acute pain Pain Location: Arm Pain Orientation: Right;Left Pain Descriptors / Indicators: Aching Pain Frequency: Intermittent Pain Onset: On-going Patients Stated Pain Goal: 1 Pain Intervention(s): Medication (See eMAR)  See Function Navigator for Current Functional Status.   Therapy/Group: Individual Therapy  Rich Brave 04/22/2018, 11:26 AM

## 2018-04-22 NOTE — Discharge Summary (Signed)
Discharge summary job [id number]

## 2018-04-22 NOTE — Progress Notes (Signed)
Physical Therapy Discharge Summary  Patient Details  Name: Kelly Wall MRN: [id number] Date of Birth: 10/17/1961  Today's Date: 04/22/2018 PT Individual Time: 1000-1100 PT Individual Time Calculation (min): 60 min    Patient has met 11 of 11 long term goals due to improved activity tolerance, improved balance, improved postural control, increased strength, decreased pain, ability to compensate for deficits and functional use of  all extremities.  Patient to discharge at an ambulatory level supervision/mod I.   Patient's care partner is independent to provide the necessary intermittent supervision assistance at discharge.  Recommendation:  Patient will benefit from ongoing skilled PT services in outpatient setting to continue to advance safe functional mobility, address ongoing impairments in muscle weakness, balance, and coordination, and minimize fall risk.  Equipment: RW  Reasons for discharge: treatment goals met  Patient/family agrees with progress made and goals achieved: Yes   Skilled PT Intervention: No c/o pain.  Session focus on d/c assessment, education, and d/c planning.  Pt currently performing all mobility with supervision or better (see below for details).  Discussed pt concerns for return home and fears of being in uncontrolled environment.  Provided emotional support and education on pacing, energy conservation, and timing outings to non-busy times.  Also reviewed verbally HEP, and provided exercises for reducing edema and increasing strength in feet to help with feeling more balanced/steady.  Pt returned to room at end of session, positioned upright in w/c with call bell in reach and needs met.   PT Discharge Precautions/Restrictions Precautions Precautions: Fall;Cervical Required Braces or Orthoses: Cervical Brace Cervical Brace: Hard collar(applied in sitting) Restrictions Weight Bearing Restrictions: No Vision/Perception  Perception Perception: Within  Functional Limits Praxis Praxis: Intact  Cognition Overall Cognitive Status: Within Functional Limits for tasks assessed Arousal/Alertness: Awake/alert Orientation Level: Oriented X4 Attention: Selective Selective Attention: Appears intact Memory: Appears intact Awareness: Appears intact Problem Solving: Appears intact Safety/Judgment: Appears intact Sensation Sensation Light Touch: Appears Intact(pt reports slightly diminished to LT on LLE) Coordination Gross Motor Movements are Fluid and Coordinated: Yes Fine Motor Movements are Fluid and Coordinated: No Coordination and Movement Description: [place] still limited in hands but improving since eval Finger Nose Finger Test: WFL, decreased speed on LUE Heel Shin Test: R=L, WFL Motor  Motor Motor: Tetraplegia  Mobility Bed Mobility Bed Mobility: Supine to Sit;Sit to Supine Supine to Sit: 6: Modified independent (Device/Increase time) Sit to Supine: 6: Modified independent (Device/Increase time) Transfers Transfers: Yes Stand Pivot Transfers: 6: Modified independent (Device/Increase time) Locomotion  Ambulation Ambulation: Yes Ambulation/Gait Assistance: 5: Supervision Ambulation Distance (Feet): 300 Feet Assistive device: None Gait Gait: Yes Gait Pattern: Impaired Gait Pattern: Step-through pattern;Decreased step length - right(decreased heel strike on R) High Level Ambulation High Level Ambulation: Side stepping;Backwards walking Side Stepping: supervision Backwards Walking: supervision/min guard Stairs / Additional Locomotion Stairs: Yes Stairs Assistance: 5: Supervision Stair Management Technique: One rail Right;One rail Left Number of Stairs: 12 Wheelchair Mobility Wheelchair Mobility: No  Trunk/Postural Assessment  Cervical Assessment Cervical Assessment: (cervical precautions) Thoracic Assessment Thoracic Assessment: Within Functional Limits Lumbar Assessment Lumbar Assessment: Within Functional  Limits Postural Control Postural Control: Within Functional Limits  Balance Balance Balance Assessed: Yes Static Sitting Balance Static Sitting - Balance Support: No upper extremity supported;Feet supported Static Sitting - Level of Assistance: 6: Modified independent (Device/Increase time) Dynamic Sitting Balance Dynamic Sitting - Balance Support: Feet supported;During functional activity Dynamic Sitting - Level of Assistance: 6: Modified independent (Device/Increase time) Static Standing Balance Static Standing - Balance Support: During  functional activity;No upper extremity supported Static Standing - Level of Assistance: 6: Modified independent (Device/Increase time) Dynamic Standing Balance Dynamic Standing - Balance Support: During functional activity;No upper extremity supported Dynamic Standing - Level of Assistance: 5: Stand by assistance Extremity Assessment      RLE Strength Right Hip Flexion: 4-/5 Right Knee Flexion: 4-/5 Right Knee Extension: 5/5 Right Ankle Dorsiflexion: 4-/5 Right Ankle Plantar Flexion: 4/5 LLE Strength Left Hip Flexion: 4+/5 Left Knee Flexion: 4+/5 Left Knee Extension: 5/5 Left Ankle Dorsiflexion: 4/5 Left Ankle Plantar Flexion: 4+/5   See Function Navigator for Current Functional Status.  Michel Santee 04/22/2018, 10:51 AM
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
## 2018-04-23 ENCOUNTER — Inpatient Hospital Stay (HOSPITAL_COMMUNITY): Payer: 59 | Admitting: Physical Therapy

## 2018-04-23 MED ORDER — OXYCODONE HCL 5 MG PO TABS: 5.0000 mg | ORAL_TABLET | ORAL | 0 refills | Status: DC | PRN

## 2018-04-23 MED ORDER — GABAPENTIN 600 MG PO TABS: 600.0000 mg | ORAL_TABLET | Freq: Four times a day (QID) | ORAL | 1 refills | Status: DC

## 2018-04-23 MED ORDER — POLYETHYLENE GLYCOL 3350 17 G PO PACK: 17.0000 g | PACK | Freq: Every day | ORAL | 0 refills | Status: DC

## 2018-04-23 MED ORDER — BACLOFEN 10 MG PO TABS: 10.0000 mg | ORAL_TABLET | Freq: Three times a day (TID) | ORAL | 0 refills | Status: DC | PRN

## 2018-04-23 MED ORDER — VITAMIN B-12 1000 MCG PO TABS: 1000.0000 ug | ORAL_TABLET | Freq: Every day | ORAL | 0 refills | Status: AC

## 2018-04-23 MED FILL — oxyCODONE HCL 5 MG TABS: 5 | 5 days supply | Qty: 30 | Fill #0

## 2018-04-23 MED FILL — BACLOFEN 10 MG TABLET: 10 | 10 days supply | Qty: 30 | Fill #0

## 2018-04-23 MED FILL — GABAPENTIN 600 MG TAB: 600 | 45 days supply | Qty: 180 | Fill #0

## 2018-04-23 NOTE — Progress Notes (Signed)
Pt discharged to home with daughter. Discharge instructions given. Pt treated for pain prior to discharge and has equipment at bedside.
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
## 2018-04-23 NOTE — Progress Notes (Signed)
Occupational Therapy Discharge Summary  Patient Details  Name: Kelly Wall MRN: [id number] Date of Birth: 18-Sep-1961  Patient has met 47 of 11 long term goals due to improved activity tolerance, improved balance, postural control, ability to compensate for deficits, functional use of  RIGHT upper and LEFT upper extremity and improved coordination.  Pt made excellent progress with BADLs during this admission.  Pt completes all bathing/dressing and toileting tasks at supervision/mod I level.  Pt completes functional transfers at supervision/mod I level.  Pt's B hands continue to exhibit a weak grasp with limited manipulation ability.  Pt incorporates effective compensatory strategies/techniques to complete functional tasks without assistance. Family has not been present for education. Patient to discharge at overall Supervision level.  Patient's care partner is independent to provide the necessary physical assistance at discharge.     Recommendation:  Patient will benefit from ongoing skilled OT services in outpatient setting to continue to advance functional skills in the area of BADL, iADL and Vocation.  Equipment: Tub Producer, television/film/video  Reasons for discharge: treatment goals met and discharge from hospital  Patient/family agrees with progress made and goals achieved: Yes  OT Discharge Vision Baseline Vision/History: No visual deficits Patient Visual Report: No change from baseline Vision Assessment?: No apparent visual deficits Perception  Perception: Within Functional Limits Praxis Praxis: Intact Cognition Overall Cognitive Status: Within Functional Limits for tasks assessed Arousal/Alertness: Awake/alert Orientation Level: Oriented X4 Attention: Selective Selective Attention: Appears intact Memory: Appears intact Awareness: Appears intact Problem Solving: Appears intact Safety/Judgment: Appears intact Sensation Sensation Light Touch: Appears Intact Stereognosis:  Impaired by gross assessment Hot/Cold: Appears Intact Proprioception: Appears Intact Coordination Gross Motor Movements are Fluid and Coordinated: Yes Fine Motor Movements are Fluid and Coordinated: No Coordination and Movement Description: [place] still limited in hands but improving since eval Finger Nose Finger Test: WFL, decreased speed on LUE Motor  Motor Motor: Tetraplegia Trunk/Postural Assessment  Cervical Assessment Cervical Assessment: (cervical collar/precautions) Thoracic Assessment Thoracic Assessment: Within Functional Limits Lumbar Assessment Lumbar Assessment: Within Functional Limits Postural Control Postural Control: Within Functional Limits  Balance Static Sitting Balance Static Sitting - Balance Support: No upper extremity supported;Feet supported Static Sitting - Level of Assistance: 6: Modified independent (Device/Increase time) Dynamic Sitting Balance Dynamic Sitting - Balance Support: Feet supported;During functional activity Dynamic Sitting - Level of Assistance: 6: Modified independent (Device/Increase time) Extremity/Trunk Assessment RUE Assessment RUE Assessment: Exceptions to [facility] RUE AROM (degrees) Right Composite Finger Extension: 75% Right Composite Finger Flexion: 75% RUE Strength Right Shoulder Flexion: 4-/5 Right Elbow Flexion: 4-/5 Right Wrist Extension: 4-/5 LUE Assessment LUE Assessment: Exceptions to WFL LUE AROM (degrees) Left Shoulder Flexion: 75 Degrees Left Composite Finger Extension: 75% Left Composite Finger Flexion: 75% LUE Strength Left Shoulder Flexion: 3+/5 Left Elbow Flexion: 3+/5 Left Elbow Extension: 3+/5   See Function Navigator for Current Functional Status.  Leotis Shames [facility] 04/23/2018, 6:40 AM

## 2018-04-23 NOTE — Progress Notes (Signed)
Social Work Discharge Note  The overall goal for the admission was met for:   Discharge location: Yes  Length of Stay: Yes - 3 weeks  Discharge activity level: Yes - supervision  Home/community participation: Yes  Services provided included: MD, RD, PT, OT, RN, Pharmacy, Neuropsych and SW  Financial Services: Private Insurance: [employer]  Follow-up services arranged: Outpatient: PT/OT at [facility], DME: tub transfer bench from [place] and Patient/Family has no preference for HH/DME agencies  Comments (or additional information):  Pt's dtr coming from [place] to be with pt for a while and then other family to assist.  Patient/Family verbalized understanding of follow-up arrangements: Yes  Individual responsible for coordination of the follow-up plan: pt with assistance from her dtr, siblings, and mother  Confirmed correct DME delivered: Trey Sailors 04/23/2018    Ayla Dunigan, Silvestre Mesi

## 2018-04-24 ENCOUNTER — Telehealth: Payer: Self-pay

## 2018-04-24 ENCOUNTER — Other Ambulatory Visit: Payer: Self-pay | Admitting: *Deleted

## 2018-04-24 NOTE — Telephone Encounter (Signed)
Transitional Care call-had to leave message for pt to call back   Appointment time, arrival time 2:00 for 2:20 appt [address]

## 2018-04-24 NOTE — Telephone Encounter (Signed)
Transitional Care call-patient    1. Are you/is patient experiencing any problems since coming home? No Are there any questions regarding any aspect of care? No 2. Are there any questions regarding medications administration/dosing? No  3.  Are meds being taken as prescribed? Yes Patient should review meds with caller to confirm 4. Have there been any falls? No 5. Has Home Health been to the house and/or have they contacted you? Yes  6. Are bowels and bladder emptying properly? Yes 7. Are there any unexpected incontinence issues? No  8. Any fevers, problems with breathing, unexpected pain? No 9. Are there any skin problems or new areas of breakdown? No 10. Has the patient/family member arranged specialty MD follow up (ie cardiology/neurology/renal/surgical/etc)?  Yes 11. Does the patient need any other services or support that we can help arrange? No 12. Are caregivers following through as expected in assisting the patient? Yes 13. Has the patient quit smoking, drinking alcohol, or using drugs as recommended? No  Appointment time, arrive time 2:00 for 2:20 appt on 04/29/18 [address]
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
## 2018-04-29 ENCOUNTER — Encounter: Payer: Self-pay | Admitting: Physical Medicine & Rehabilitation

## 2018-04-29 ENCOUNTER — Encounter: Payer: 59 | Attending: Physical Medicine & Rehabilitation | Admitting: Physical Medicine & Rehabilitation

## 2018-04-29 VITALS — BP 139/86 | HR 68 | Ht 61.0 in | Wt 106.0 lb

## 2018-04-29 DIAGNOSIS — R202 Paresthesia of skin: Secondary | ICD-10-CM | POA: Diagnosis not present

## 2018-04-29 DIAGNOSIS — G959 Disease of spinal cord, unspecified: Secondary | ICD-10-CM

## 2018-04-29 DIAGNOSIS — S14125S 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 of cervical spinal cord, sequela: Secondary | ICD-10-CM | POA: Diagnosis not present

## 2018-04-29 DIAGNOSIS — M4322 Fusion of spine, cervical region: Secondary | ICD-10-CM | POA: Diagnosis not present

## 2018-04-29 DIAGNOSIS — K592 Neurogenic bowel, not elsewhere classified: Secondary | ICD-10-CM | POA: Diagnosis not present

## 2018-04-29 DIAGNOSIS — I10 Essential (primary) hypertension: Secondary | ICD-10-CM | POA: Diagnosis not present

## 2018-04-29 DIAGNOSIS — R209 Unspecified disturbances of skin sensation: Secondary | ICD-10-CM

## 2018-04-29 DIAGNOSIS — J45909 Unspecified asthma, uncomplicated: Secondary | ICD-10-CM | POA: Diagnosis not present

## 2018-04-29 DIAGNOSIS — Z9889 Other specified postprocedural states: Secondary | ICD-10-CM | POA: Insufficient documentation

## 2018-04-29 DIAGNOSIS — G9589 Other specified diseases of spinal cord: Secondary | ICD-10-CM | POA: Insufficient documentation

## 2018-04-29 DIAGNOSIS — M79601 Pain in right arm: Secondary | ICD-10-CM | POA: Diagnosis not present

## 2018-04-29 DIAGNOSIS — M79602 Pain in left arm: Secondary | ICD-10-CM | POA: Diagnosis not present

## 2018-04-29 MED ORDER — PREGABALIN 50 MG PO CAPS: 50.0000 mg | ORAL_CAPSULE | Freq: Two times a day (BID) | ORAL | 4 refills | Status: DC

## 2018-04-29 MED ORDER — OXYCODONE HCL 5 MG PO TABS: 5.0000 mg | ORAL_TABLET | ORAL | 0 refills | Status: DC | PRN

## 2018-04-29 MED FILL — oxyCODONE HCL 5 MG TABS: 5 | 15 days supply | Qty: 90 | Fill #0

## 2018-04-29 MED FILL — LYRICA 50 MG CAPSULE: 50 | 30 days supply | Qty: 60 | Fill #0

## 2018-04-29 NOTE — Patient Instructions (Addendum)
PLEASE FEEL FREE TO CALL OUR OFFICE WITH ANY PROBLEMS OR QUESTIONS [phone])     LYRICA: 50MG  AT NIGHT FOR 3 NIGHTS, THEN INCREASE TO TWICE DAILY
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
## 2018-04-30 ENCOUNTER — Encounter: Payer: Self-pay | Admitting: Physical Therapy

## 2018-04-30 ENCOUNTER — Other Ambulatory Visit: Payer: Self-pay

## 2018-04-30 ENCOUNTER — Ambulatory Visit: Payer: 59 | Admitting: Physical Therapy

## 2018-04-30 ENCOUNTER — Ambulatory Visit: Payer: 59 | Attending: Physical Medicine & Rehabilitation | Admitting: Occupational Therapy

## 2018-04-30 ENCOUNTER — Encounter: Payer: Self-pay | Admitting: Occupational Therapy

## 2018-04-30 DIAGNOSIS — M6281 Muscle weakness (generalized): Secondary | ICD-10-CM

## 2018-04-30 DIAGNOSIS — R29818 Other symptoms and signs involving the nervous system: Secondary | ICD-10-CM | POA: Diagnosis not present

## 2018-04-30 DIAGNOSIS — R2681 Unsteadiness on feet: Secondary | ICD-10-CM | POA: Insufficient documentation

## 2018-04-30 DIAGNOSIS — R2689 Other abnormalities of gait and mobility: Secondary | ICD-10-CM | POA: Diagnosis not present

## 2018-04-30 DIAGNOSIS — M79602 Pain in left arm: Secondary | ICD-10-CM | POA: Diagnosis not present

## 2018-04-30 DIAGNOSIS — R208 Other disturbances of skin sensation: Secondary | ICD-10-CM | POA: Insufficient documentation

## 2018-04-30 DIAGNOSIS — R278 Other lack of coordination: Secondary | ICD-10-CM | POA: Diagnosis not present
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
## 2018-05-02 ENCOUNTER — Inpatient Hospital Stay: Payer: 59 | Admitting: Internal Medicine

## 2018-05-02 DIAGNOSIS — Z0289 Encounter for other administrative examinations: Secondary | ICD-10-CM

## 2018-05-06 MED FILL — CLINDAMYCIN PHOS-BENZOYL PE: 1-5 | 30 days supply | Qty: 25 | Fill #1

## 2018-05-07 ENCOUNTER — Ambulatory Visit: Payer: 59 | Admitting: Physical Therapy

## 2018-05-07 ENCOUNTER — Encounter: Payer: Self-pay | Admitting: Physical Therapy

## 2018-05-07 DIAGNOSIS — R2689 Other abnormalities of gait and mobility: Secondary | ICD-10-CM | POA: Diagnosis not present

## 2018-05-07 DIAGNOSIS — R2681 Unsteadiness on feet: Secondary | ICD-10-CM

## 2018-05-07 DIAGNOSIS — M6281 Muscle weakness (generalized): Secondary | ICD-10-CM | POA: Diagnosis not present

## 2018-05-07 DIAGNOSIS — M79602 Pain in left arm: Secondary | ICD-10-CM | POA: Diagnosis not present

## 2018-05-07 DIAGNOSIS — R29818 Other symptoms and signs involving the nervous system: Secondary | ICD-10-CM | POA: Diagnosis not present

## 2018-05-07 DIAGNOSIS — R278 Other lack of coordination: Secondary | ICD-10-CM | POA: Diagnosis not present

## 2018-05-07 DIAGNOSIS — R208 Other disturbances of skin sensation: Secondary | ICD-10-CM | POA: Diagnosis not present

## 2018-05-07 NOTE — Patient Instructions (Signed)
Access Code: [email or web address]  URL: [email or web address]  Date: 05/07/2018  Prepared by: Veda CanningLynn Benecio Kluger   Exercises  Side Stepping with Resistance at Ankles and Counter Support - 20 reps - 3 sets - 1x daily - 5x weekly  Forward Monster Walks - 20 reps - 3 sets - 1x daily - 5x weekly  Backward Monster Walks - 20 reps - 3 sets - 1x daily - 5x weekly  Tandem Walking with Counter Support - 10 reps - 1 sets - 1x daily - 5x weekly  Sit to Stand without Arm Support - 20 reps - 3 sets - 1x daily - 5x weekly
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
## 2018-05-09 DIAGNOSIS — M5412 Radiculopathy, cervical region: Secondary | ICD-10-CM | POA: Diagnosis not present

## 2018-05-13 ENCOUNTER — Encounter: Payer: Self-pay | Admitting: Physical Therapy

## 2018-05-13 ENCOUNTER — Ambulatory Visit: Payer: 59 | Admitting: Physical Therapy

## 2018-05-13 ENCOUNTER — Ambulatory Visit: Payer: 59 | Admitting: Occupational Therapy

## 2018-05-13 ENCOUNTER — Encounter: Payer: Self-pay | Admitting: Occupational Therapy

## 2018-05-13 DIAGNOSIS — R278 Other lack of coordination: Secondary | ICD-10-CM | POA: Diagnosis not present

## 2018-05-13 DIAGNOSIS — M6281 Muscle weakness (generalized): Secondary | ICD-10-CM | POA: Diagnosis not present

## 2018-05-13 DIAGNOSIS — R2689 Other abnormalities of gait and mobility: Secondary | ICD-10-CM | POA: Diagnosis not present

## 2018-05-13 DIAGNOSIS — M79602 Pain in left arm: Secondary | ICD-10-CM

## 2018-05-13 DIAGNOSIS — R208 Other disturbances of skin sensation: Secondary | ICD-10-CM

## 2018-05-13 DIAGNOSIS — R29818 Other symptoms and signs involving the nervous system: Secondary | ICD-10-CM

## 2018-05-13 DIAGNOSIS — R2681 Unsteadiness on feet: Secondary | ICD-10-CM
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
## 2018-05-13 NOTE — Patient Instructions (Addendum)
  Coordination Activities  Perform the following activities for 20  minutes 1-2 times per day with both hand(s).   Rotate ball in fingertips (clockwise and counter-clockwise).  Flip cards 1 at a time.  Keep shoulder down  Deal cards with your thumb (Hold deck in hand and push card off top with thumb).  Pick up coins, buttons, marbles, dried beans/pasta of different sizes and place in container.  Use index finger/thumb, don't drag to the edge.  Pick up coins and place in container or coin bank.  Pick up coins and stack.   Use index finger/thumb, don't drag to the edge.    Pick up coins one at a time until you get 5-10 in your hand, then move coins from palm to fingertips to stack one at a time.  Keep shoulder down  Practice writing and/or typing.  Place palm flat on table.  Lift each finger individually

## 2018-05-14 ENCOUNTER — Encounter: Payer: Self-pay | Admitting: Occupational Therapy

## 2018-05-14 ENCOUNTER — Ambulatory Visit: Payer: 59 | Admitting: Occupational Therapy

## 2018-05-14 DIAGNOSIS — M6281 Muscle weakness (generalized): Secondary | ICD-10-CM

## 2018-05-14 DIAGNOSIS — R208 Other disturbances of skin sensation: Secondary | ICD-10-CM | POA: Diagnosis not present

## 2018-05-14 DIAGNOSIS — R278 Other lack of coordination: Secondary | ICD-10-CM

## 2018-05-14 DIAGNOSIS — R2689 Other abnormalities of gait and mobility: Secondary | ICD-10-CM | POA: Diagnosis not present

## 2018-05-14 DIAGNOSIS — R2681 Unsteadiness on feet: Secondary | ICD-10-CM

## 2018-05-14 DIAGNOSIS — R29818 Other symptoms and signs involving the nervous system: Secondary | ICD-10-CM | POA: Diagnosis not present

## 2018-05-14 DIAGNOSIS — M79602 Pain in left arm: Secondary | ICD-10-CM

## 2018-05-14 NOTE — Patient Instructions (Addendum)
   Lay down.  Hold a ball/shoe box/pillow (shoulder width sized)  with arms straight. Slowly move arms up in an arc with elbows straight and then back down. Repeat 15 times.  Do 2 sets per day.  Then perform in diagonals (touch leg on right side and then move out and up to the left).  Repeat on both sides.      ROM: Extension - Wand (Standing)   Stand holding wand behind back. Raise arms as far as possible.  PALMS UP Repeat 10 times per set.  Do 2 sessions per day.    Active Assistive Shoulder External Rotation    SIT with stick at waist level, LEFT palm up, other palm down. Push forearm out from body with hand palm down, and keep elbows bent. Hold. Return to start position. Perform 10 reps. 2X DAY     1. Grip Strengthening (Resistive Putty)   Squeeze putty using thumb and all fingers. Repeat 15 times. Do 1 sessions per day.   Extension (Assistive Putty)   Roll putty back and forth, being sure to use all fingertips. Repeat 2 times. Do 1 sessions per day.  Then pinch as below.   Palmar Pinch Strengthening (Resistive Putty)   Pinch putty between thumb and each fingertip in turn after rolling out   Lateral Pinch Strengthening (Resistive Putty)    Squeeze between thumb and side of each finger in turn. Repeat 10 times. Do 1 sessions per day.  Copyright  VHI. All rights reserved.     MP Flexion (Resistive Putty)   Bending only at large knuckles, press putty down against thumb. Keep fingertips straight. Repeat 10 times. Do 1 sessions per day.
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
## 2018-05-17 ENCOUNTER — Encounter: Payer: Self-pay | Admitting: Physical Therapy

## 2018-05-17 ENCOUNTER — Ambulatory Visit: Payer: 59 | Admitting: Physical Therapy

## 2018-05-17 DIAGNOSIS — R29818 Other symptoms and signs involving the nervous system: Secondary | ICD-10-CM

## 2018-05-17 DIAGNOSIS — R2681 Unsteadiness on feet: Secondary | ICD-10-CM | POA: Diagnosis not present

## 2018-05-17 DIAGNOSIS — R2689 Other abnormalities of gait and mobility: Secondary | ICD-10-CM | POA: Diagnosis not present

## 2018-05-17 DIAGNOSIS — M6281 Muscle weakness (generalized): Secondary | ICD-10-CM | POA: Diagnosis not present

## 2018-05-17 DIAGNOSIS — R278 Other lack of coordination: Secondary | ICD-10-CM | POA: Diagnosis not present

## 2018-05-17 DIAGNOSIS — R208 Other disturbances of skin sensation: Secondary | ICD-10-CM | POA: Diagnosis not present

## 2018-05-17 DIAGNOSIS — M79602 Pain in left arm: Secondary | ICD-10-CM | POA: Diagnosis not present

## 2018-05-17 NOTE — Patient Instructions (Addendum)
Access Code: [email or web address]  URL: [email or web address]  Date: 05/17/2018  Prepared by: Sallyanne KusterKathy Nana Hoselton   Exercises  Supine Cervical Rotation AROM on Pillow - 10 reps - 1 sets - 5 hold - 1x daily - 7x weekly  Supine Chin Tucks on Flat Ball - 10 reps - 1 sets - 5 hold - 1x daily - 7x weekly
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
## 2018-05-20 ENCOUNTER — Ambulatory Visit: Payer: 59 | Admitting: Occupational Therapy

## 2018-05-20 ENCOUNTER — Ambulatory Visit: Payer: 59 | Admitting: Physical Therapy

## 2018-05-20 ENCOUNTER — Encounter: Payer: Self-pay | Admitting: Occupational Therapy

## 2018-05-20 ENCOUNTER — Encounter: Payer: Self-pay | Admitting: Physical Therapy

## 2018-05-20 DIAGNOSIS — R208 Other disturbances of skin sensation: Secondary | ICD-10-CM | POA: Diagnosis not present

## 2018-05-20 DIAGNOSIS — R2689 Other abnormalities of gait and mobility: Secondary | ICD-10-CM

## 2018-05-20 DIAGNOSIS — M79602 Pain in left arm: Secondary | ICD-10-CM | POA: Diagnosis not present

## 2018-05-20 DIAGNOSIS — R2681 Unsteadiness on feet: Secondary | ICD-10-CM | POA: Diagnosis not present

## 2018-05-20 DIAGNOSIS — R29818 Other symptoms and signs involving the nervous system: Secondary | ICD-10-CM | POA: Diagnosis not present

## 2018-05-20 DIAGNOSIS — R278 Other lack of coordination: Secondary | ICD-10-CM

## 2018-05-20 DIAGNOSIS — M6281 Muscle weakness (generalized): Secondary | ICD-10-CM

## 2018-05-20 NOTE — Patient Instructions (Addendum)
pper Extremity Extension (All-Fours)   Tighten stomach and raise left arm parallel to floor. Keep trunk rigid. Repeat 10 times per set.  Do 1 sessions per day.  orward USAALean (All-Fours)    Rock back so that weight is on heels then (arms out straight), Slowly lean forward, keeping stomach tight. Keep trunk rigid. Repeat 8 times per set. Do 10 sets per session. Do 1 sessions per day.     Strengthening: Resisted Flexion   Attach tube to door.  Hold tubing with one arm at side. Pull forward and up with elbow straight. Move shoulder through pain-free range of motion, no further than shoulder height. Repeat 10 times per set.  Do 1 sessions per day.    Strengthening: Resisted Extension   Attach one end to door.  Hold tubing in one hand, arm forward. Pull arm back, elbow straight. Repeat 10 times per set. Do 1sessions per day.   Resisted Horizontal Abduction: Bilateral   Sit or stand, tubing in both hands, palms down and arms out in front. Keeping arms straight, pinch shoulder blades together and stretch arms out. Repeat 15 times per set.  Do 1-2 sessions per day.   BELLY BUTTON LEVEL INSTEAD OF CHEST HEIGHT   Elbow Flexion: Resisted   Hold tubing wrapped around  One hand and and other end secured under foot, curl arm up as far as possible keeping elbow down by your side. Repeat 15 times per set.  Do 1-2 sessions per day.      Triceps Extension (Frontal)    One arm forward at chest height and bent to 90, end of band in hand, other end secured on same side shoulder by other hand, extend arm DOWN slowly. Hold 2seconds. Repeat 15 times, alternating arms. Do 1 sessions per day.   **REPEAT ALL WITH BOTH ARMS.
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
## 2018-05-21 MED FILL — LORYNA 3-0.02 MG TABS: 3-0.02 | 84 days supply | Qty: 84 | Fill #0
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
## 2018-05-22 ENCOUNTER — Ambulatory Visit: Payer: 59 | Admitting: Physical Therapy

## 2018-05-22 ENCOUNTER — Encounter: Payer: Self-pay | Admitting: Physical Therapy

## 2018-05-22 ENCOUNTER — Ambulatory Visit: Payer: 59 | Admitting: Occupational Therapy

## 2018-05-22 DIAGNOSIS — M6281 Muscle weakness (generalized): Secondary | ICD-10-CM | POA: Diagnosis not present

## 2018-05-22 DIAGNOSIS — R278 Other lack of coordination: Secondary | ICD-10-CM

## 2018-05-22 DIAGNOSIS — M79602 Pain in left arm: Secondary | ICD-10-CM | POA: Diagnosis not present

## 2018-05-22 DIAGNOSIS — R2689 Other abnormalities of gait and mobility: Secondary | ICD-10-CM

## 2018-05-22 DIAGNOSIS — R208 Other disturbances of skin sensation: Secondary | ICD-10-CM | POA: Diagnosis not present

## 2018-05-22 DIAGNOSIS — R2681 Unsteadiness on feet: Secondary | ICD-10-CM

## 2018-05-22 DIAGNOSIS — R29818 Other symptoms and signs involving the nervous system: Secondary | ICD-10-CM | POA: Diagnosis not present
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
## 2018-05-28 ENCOUNTER — Ambulatory Visit: Payer: 59 | Admitting: Occupational Therapy

## 2018-05-28 ENCOUNTER — Ambulatory Visit: Payer: 59 | Attending: Physical Medicine & Rehabilitation | Admitting: Physical Therapy

## 2018-05-28 ENCOUNTER — Encounter: Payer: Self-pay | Admitting: Physical Therapy

## 2018-05-28 DIAGNOSIS — R208 Other disturbances of skin sensation: Secondary | ICD-10-CM | POA: Diagnosis not present

## 2018-05-28 DIAGNOSIS — M79602 Pain in left arm: Secondary | ICD-10-CM

## 2018-05-28 DIAGNOSIS — R278 Other lack of coordination: Secondary | ICD-10-CM | POA: Diagnosis not present

## 2018-05-28 DIAGNOSIS — M6281 Muscle weakness (generalized): Secondary | ICD-10-CM | POA: Diagnosis not present

## 2018-05-28 DIAGNOSIS — R2689 Other abnormalities of gait and mobility: Secondary | ICD-10-CM

## 2018-05-28 DIAGNOSIS — R2681 Unsteadiness on feet: Secondary | ICD-10-CM | POA: Insufficient documentation

## 2018-05-28 DIAGNOSIS — R29818 Other symptoms and signs involving the nervous system: Secondary | ICD-10-CM
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
## 2018-05-28 NOTE — Patient Instructions (Addendum)
AROM: Wrist Extension   .  With right palm down, bend wrist up with HAND OPEN Repeat __15__ times per set.  Do __3__ sessions per day.
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
## 2018-05-31 ENCOUNTER — Encounter: Payer: Self-pay | Admitting: Physical Therapy

## 2018-05-31 ENCOUNTER — Ambulatory Visit: Payer: 59 | Admitting: Occupational Therapy

## 2018-05-31 ENCOUNTER — Ambulatory Visit: Payer: 59 | Admitting: Physical Therapy

## 2018-05-31 DIAGNOSIS — R2681 Unsteadiness on feet: Secondary | ICD-10-CM

## 2018-05-31 DIAGNOSIS — R2689 Other abnormalities of gait and mobility: Secondary | ICD-10-CM

## 2018-05-31 DIAGNOSIS — M6281 Muscle weakness (generalized): Secondary | ICD-10-CM | POA: Diagnosis not present

## 2018-05-31 DIAGNOSIS — R208 Other disturbances of skin sensation: Secondary | ICD-10-CM | POA: Diagnosis not present

## 2018-05-31 DIAGNOSIS — M79602 Pain in left arm: Secondary | ICD-10-CM | POA: Diagnosis not present

## 2018-05-31 DIAGNOSIS — R29818 Other symptoms and signs involving the nervous system: Secondary | ICD-10-CM

## 2018-05-31 DIAGNOSIS — R278 Other lack of coordination: Secondary | ICD-10-CM | POA: Diagnosis not present
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
## 2018-06-04 ENCOUNTER — Encounter: Payer: Self-pay | Admitting: Occupational Therapy

## 2018-06-04 ENCOUNTER — Ambulatory Visit: Payer: 59 | Admitting: Occupational Therapy

## 2018-06-04 ENCOUNTER — Encounter: Payer: Self-pay | Admitting: Physical Therapy

## 2018-06-04 ENCOUNTER — Ambulatory Visit: Payer: 59 | Admitting: Physical Therapy

## 2018-06-04 DIAGNOSIS — R29818 Other symptoms and signs involving the nervous system: Secondary | ICD-10-CM | POA: Diagnosis not present

## 2018-06-04 DIAGNOSIS — R278 Other lack of coordination: Secondary | ICD-10-CM

## 2018-06-04 DIAGNOSIS — R2689 Other abnormalities of gait and mobility: Secondary | ICD-10-CM

## 2018-06-04 DIAGNOSIS — R2681 Unsteadiness on feet: Secondary | ICD-10-CM | POA: Diagnosis not present

## 2018-06-04 DIAGNOSIS — M79602 Pain in left arm: Secondary | ICD-10-CM | POA: Diagnosis not present

## 2018-06-04 DIAGNOSIS — M6281 Muscle weakness (generalized): Secondary | ICD-10-CM

## 2018-06-04 DIAGNOSIS — R208 Other disturbances of skin sensation: Secondary | ICD-10-CM
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
## 2018-06-06 ENCOUNTER — Encounter: Payer: Self-pay | Admitting: Physical Therapy

## 2018-06-06 ENCOUNTER — Ambulatory Visit: Payer: 59 | Admitting: Occupational Therapy

## 2018-06-06 ENCOUNTER — Encounter: Payer: Self-pay | Admitting: Occupational Therapy

## 2018-06-06 ENCOUNTER — Ambulatory Visit: Payer: 59 | Admitting: Physical Therapy

## 2018-06-06 DIAGNOSIS — M79602 Pain in left arm: Secondary | ICD-10-CM

## 2018-06-06 DIAGNOSIS — R208 Other disturbances of skin sensation: Secondary | ICD-10-CM

## 2018-06-06 DIAGNOSIS — R2689 Other abnormalities of gait and mobility: Secondary | ICD-10-CM

## 2018-06-06 DIAGNOSIS — R29818 Other symptoms and signs involving the nervous system: Secondary | ICD-10-CM | POA: Diagnosis not present

## 2018-06-06 DIAGNOSIS — R2681 Unsteadiness on feet: Secondary | ICD-10-CM

## 2018-06-06 DIAGNOSIS — M6281 Muscle weakness (generalized): Secondary | ICD-10-CM

## 2018-06-06 DIAGNOSIS — R278 Other lack of coordination: Secondary | ICD-10-CM | POA: Diagnosis not present
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
## 2018-06-06 NOTE — Patient Instructions (Addendum)
Extension (Assistive Putty)    Roll putty back and forth, being sure to use all fingertips.  Then press putty with each fingertip.  Repeat 5 times. Do 1 sessions per day.   Yellow putty.  Copyright  VHI. All rights reserved.  Finger and Thumb Extension (Resistive Putty)    With thumb and all fingers in center of putty donut, stretch out.  Red putty Repeat 10 times. Do1 sessions per day.  Copyright  VHI. All rights reserved.

## 2018-06-10 ENCOUNTER — Ambulatory Visit: Payer: 59 | Admitting: Physical Therapy

## 2018-06-10 ENCOUNTER — Encounter: Payer: Self-pay | Admitting: Physical Therapy

## 2018-06-10 ENCOUNTER — Ambulatory Visit: Payer: 59 | Admitting: Occupational Therapy

## 2018-06-10 DIAGNOSIS — R29818 Other symptoms and signs involving the nervous system: Secondary | ICD-10-CM

## 2018-06-10 DIAGNOSIS — R2689 Other abnormalities of gait and mobility: Secondary | ICD-10-CM | POA: Diagnosis not present

## 2018-06-10 DIAGNOSIS — R278 Other lack of coordination: Secondary | ICD-10-CM

## 2018-06-10 DIAGNOSIS — M6281 Muscle weakness (generalized): Secondary | ICD-10-CM | POA: Diagnosis not present

## 2018-06-10 DIAGNOSIS — M79602 Pain in left arm: Secondary | ICD-10-CM

## 2018-06-10 DIAGNOSIS — R208 Other disturbances of skin sensation: Secondary | ICD-10-CM | POA: Diagnosis not present

## 2018-06-10 DIAGNOSIS — R2681 Unsteadiness on feet: Secondary | ICD-10-CM | POA: Diagnosis not present
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
## 2018-06-11 ENCOUNTER — Encounter: Payer: 59 | Attending: Physical Medicine & Rehabilitation | Admitting: Physical Medicine & Rehabilitation

## 2018-06-11 ENCOUNTER — Encounter: Payer: Self-pay | Admitting: Physical Medicine & Rehabilitation

## 2018-06-11 VITALS — BP 113/78 | HR 76 | Ht 61.0 in | Wt 111.0 lb

## 2018-06-11 DIAGNOSIS — M4322 Fusion of spine, cervical region: Secondary | ICD-10-CM | POA: Insufficient documentation

## 2018-06-11 DIAGNOSIS — R202 Paresthesia of skin: Secondary | ICD-10-CM | POA: Diagnosis not present

## 2018-06-11 DIAGNOSIS — Z5181 Encounter for therapeutic drug level monitoring: Secondary | ICD-10-CM

## 2018-06-11 DIAGNOSIS — M79601 Pain in right arm: Secondary | ICD-10-CM | POA: Diagnosis not present

## 2018-06-11 DIAGNOSIS — Z79899 Other long term (current) drug therapy: Secondary | ICD-10-CM

## 2018-06-11 DIAGNOSIS — I10 Essential (primary) hypertension: Secondary | ICD-10-CM | POA: Insufficient documentation

## 2018-06-11 DIAGNOSIS — G9589 Other specified diseases of spinal cord: Secondary | ICD-10-CM | POA: Diagnosis not present

## 2018-06-11 DIAGNOSIS — R209 Unspecified disturbances of skin sensation: Secondary | ICD-10-CM

## 2018-06-11 DIAGNOSIS — K592 Neurogenic bowel, not elsewhere classified: Secondary | ICD-10-CM | POA: Insufficient documentation

## 2018-06-11 DIAGNOSIS — S14125S 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 of cervical spinal cord, sequela: Secondary | ICD-10-CM

## 2018-06-11 DIAGNOSIS — Z9889 Other specified postprocedural states: Secondary | ICD-10-CM | POA: Diagnosis not present

## 2018-06-11 DIAGNOSIS — M79602 Pain in left arm: Secondary | ICD-10-CM | POA: Diagnosis not present

## 2018-06-11 DIAGNOSIS — J45909 Unspecified asthma, uncomplicated: Secondary | ICD-10-CM | POA: Diagnosis not present

## 2018-06-11 MED ORDER — GABAPENTIN 600 MG PO TABS: 600.0000 mg | ORAL_TABLET | Freq: Three times a day (TID) | ORAL | 1 refills | Status: DC

## 2018-06-11 MED ORDER — PREGABALIN 100 MG PO CAPS: 100.0000 mg | ORAL_CAPSULE | Freq: Two times a day (BID) | ORAL | 4 refills | Status: DC

## 2018-06-11 MED FILL — LYRICA 100 MG CAPSULE: 100 | 30 days supply | Qty: 60 | Fill #0

## 2018-06-11 NOTE — Patient Instructions (Signed)
Reduce gabapentin to 600mg  three x daily  Increase lyrica to 100mg  twice daily   PLEASE FEEL FREE TO CALL OUR OFFICE WITH ANY PROBLEMS OR QUESTIONS [phone])
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
## 2018-06-13 ENCOUNTER — Encounter: Payer: Self-pay | Admitting: Occupational Therapy

## 2018-06-13 ENCOUNTER — Ambulatory Visit: Payer: 59 | Admitting: Occupational Therapy

## 2018-06-13 ENCOUNTER — Encounter: Payer: Self-pay | Admitting: Physical Therapy

## 2018-06-13 ENCOUNTER — Ambulatory Visit: Payer: 59 | Admitting: Physical Therapy

## 2018-06-13 DIAGNOSIS — R29818 Other symptoms and signs involving the nervous system: Secondary | ICD-10-CM

## 2018-06-13 DIAGNOSIS — R208 Other disturbances of skin sensation: Secondary | ICD-10-CM

## 2018-06-13 DIAGNOSIS — R2681 Unsteadiness on feet: Secondary | ICD-10-CM | POA: Diagnosis not present

## 2018-06-13 DIAGNOSIS — R2689 Other abnormalities of gait and mobility: Secondary | ICD-10-CM

## 2018-06-13 DIAGNOSIS — R278 Other lack of coordination: Secondary | ICD-10-CM

## 2018-06-13 DIAGNOSIS — M6281 Muscle weakness (generalized): Secondary | ICD-10-CM | POA: Diagnosis not present

## 2018-06-13 DIAGNOSIS — M79602 Pain in left arm: Secondary | ICD-10-CM
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
## 2018-06-16 LAB — TOXASSURE SELECT,+ANTIDEPR,UR

## 2018-06-17 ENCOUNTER — Encounter: Payer: 59 | Admitting: Occupational Therapy

## 2018-06-17 ENCOUNTER — Telehealth: Payer: Self-pay | Admitting: *Deleted

## 2018-06-17 NOTE — Telephone Encounter (Signed)
Urine drug screen for this encounter is consistent for prescribed medication 

## 2018-06-20 ENCOUNTER — Encounter: Payer: 59 | Admitting: Occupational Therapy

## 2018-06-24 ENCOUNTER — Encounter: Payer: 59 | Admitting: Occupational Therapy

## 2018-06-24 ENCOUNTER — Other Ambulatory Visit: Payer: Self-pay | Admitting: Physical Medicine & Rehabilitation

## 2018-06-24 DIAGNOSIS — R209 Unspecified disturbances of skin sensation: Secondary | ICD-10-CM

## 2018-06-24 DIAGNOSIS — M79601 Pain in right arm: Secondary | ICD-10-CM

## 2018-06-24 DIAGNOSIS — M79602 Pain in left arm: Principal | ICD-10-CM

## 2018-06-24 DIAGNOSIS — S14125S 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 of cervical spinal cord, sequela: Secondary | ICD-10-CM

## 2018-06-24 DIAGNOSIS — R202 Paresthesia of skin: Principal | ICD-10-CM

## 2018-06-24 MED FILL — GABAPENTIN 600 MG TABS: 600 | 45 days supply | Qty: 180 | Fill #1

## 2018-06-24 MED FILL — CLINDAMYCIN PHOS-BENZOYL PE: 1-5 | 30 days supply | Qty: 25 | Fill #2

## 2018-06-24 MED FILL — TRETINOIN 0.025% CREAM: 0.025 | 90 days supply | Qty: 45 | Fill #1

## 2018-06-25 ENCOUNTER — Encounter: Payer: Self-pay | Admitting: Physical Therapy

## 2018-06-25 ENCOUNTER — Encounter: Payer: Self-pay | Admitting: Occupational Therapy

## 2018-06-25 ENCOUNTER — Ambulatory Visit: Payer: 59 | Admitting: Physical Therapy

## 2018-06-25 ENCOUNTER — Ambulatory Visit: Payer: 59 | Admitting: Occupational Therapy

## 2018-06-25 DIAGNOSIS — M6281 Muscle weakness (generalized): Secondary | ICD-10-CM

## 2018-06-25 DIAGNOSIS — R2689 Other abnormalities of gait and mobility: Secondary | ICD-10-CM

## 2018-06-25 DIAGNOSIS — M79602 Pain in left arm: Secondary | ICD-10-CM | POA: Diagnosis not present

## 2018-06-25 DIAGNOSIS — R2681 Unsteadiness on feet: Secondary | ICD-10-CM | POA: Diagnosis not present

## 2018-06-25 DIAGNOSIS — R29818 Other symptoms and signs involving the nervous system: Secondary | ICD-10-CM

## 2018-06-25 DIAGNOSIS — R208 Other disturbances of skin sensation: Secondary | ICD-10-CM

## 2018-06-25 DIAGNOSIS — R278 Other lack of coordination: Secondary | ICD-10-CM | POA: Diagnosis not present
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
## 2018-06-25 NOTE — Telephone Encounter (Signed)
Recieved electronic medication refill request from patients pharmacy for oxycodone, according to last note on 06-11-18:  -Oxycodone was not refilled today as she is only using this at nighttime  Verified in PMP last prescription:  PMP = 04/29/2018 Oxycodone Hcl 5 Mg Tablet   D#90      Za Swa

## 2018-06-25 NOTE — Patient Instructions (Addendum)
Access Code: [email or web address]  URL: [email or web address]  Date: 06/25/2018  Prepared by: Veda CanningLynn Lyly Canizales   Exercises  Side Stepping with Resistance at Ankles and Counter Support - 20 reps - 3 sets - 1x daily - 5x weekly  Forward Monster Walks - 20 reps - 3 sets - 1x daily - 5x weekly  Backward Monster Walks - 20 reps - 3 sets - 1x daily - 5x weekly  Tandem Walking with Counter Support - 10 reps - 1 sets - 1x daily - 5x weekly  Sit to Stand without Arm Support - 20 reps - 3 sets - 1x daily - 5x weekly   ADDED to program today: Clamshell - 10 reps - 3 sets - 3 sec hold - 2x daily - 5x weekly  Sidelying Hip Abduction - 10 reps - 3 sets - 3 sec hold - 2x daily - 5x weekly  Standing Hip Abduction with Counter Support - 10 reps - 3 sets - 3 seconds hold - 2x daily - 5x weekly

## 2018-06-26 MED FILL — oxyCODONE HCL 5 MG TABS: 5 | 30 days supply | Qty: 60 | Fill #0
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
## 2018-06-26 NOTE — Telephone Encounter (Signed)
I sent in rx

## 2018-06-27 ENCOUNTER — Encounter: Payer: Self-pay | Admitting: Physical Therapy

## 2018-06-27 ENCOUNTER — Encounter: Payer: Self-pay | Admitting: Occupational Therapy

## 2018-06-27 ENCOUNTER — Ambulatory Visit: Payer: 59 | Attending: Physical Medicine & Rehabilitation | Admitting: Occupational Therapy

## 2018-06-27 ENCOUNTER — Ambulatory Visit: Payer: 59 | Admitting: Physical Therapy

## 2018-06-27 DIAGNOSIS — R208 Other disturbances of skin sensation: Secondary | ICD-10-CM | POA: Insufficient documentation

## 2018-06-27 DIAGNOSIS — R278 Other lack of coordination: Secondary | ICD-10-CM

## 2018-06-27 DIAGNOSIS — M542 Cervicalgia: Secondary | ICD-10-CM | POA: Diagnosis not present

## 2018-06-27 DIAGNOSIS — R2689 Other abnormalities of gait and mobility: Secondary | ICD-10-CM

## 2018-06-27 DIAGNOSIS — R2681 Unsteadiness on feet: Secondary | ICD-10-CM | POA: Diagnosis not present

## 2018-06-27 DIAGNOSIS — M6281 Muscle weakness (generalized): Secondary | ICD-10-CM | POA: Insufficient documentation

## 2018-06-27 DIAGNOSIS — R29818 Other symptoms and signs involving the nervous system: Secondary | ICD-10-CM | POA: Diagnosis not present

## 2018-06-27 DIAGNOSIS — M79602 Pain in left arm: Secondary | ICD-10-CM | POA: Diagnosis not present
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
## 2018-06-28 ENCOUNTER — Ambulatory Visit: Payer: 59 | Admitting: Physical Therapy

## 2018-07-01 ENCOUNTER — Ambulatory Visit: Payer: 59 | Admitting: Physical Therapy

## 2018-07-01 ENCOUNTER — Encounter: Payer: Self-pay | Admitting: Occupational Therapy

## 2018-07-01 ENCOUNTER — Encounter: Payer: Self-pay | Admitting: Physical Therapy

## 2018-07-01 ENCOUNTER — Ambulatory Visit: Payer: 59 | Admitting: Occupational Therapy

## 2018-07-01 DIAGNOSIS — R29818 Other symptoms and signs involving the nervous system: Secondary | ICD-10-CM

## 2018-07-01 DIAGNOSIS — M6281 Muscle weakness (generalized): Secondary | ICD-10-CM

## 2018-07-01 DIAGNOSIS — R2681 Unsteadiness on feet: Secondary | ICD-10-CM

## 2018-07-01 DIAGNOSIS — M79602 Pain in left arm: Secondary | ICD-10-CM | POA: Diagnosis not present

## 2018-07-01 DIAGNOSIS — R278 Other lack of coordination: Secondary | ICD-10-CM

## 2018-07-01 DIAGNOSIS — M542 Cervicalgia: Secondary | ICD-10-CM

## 2018-07-01 DIAGNOSIS — R2689 Other abnormalities of gait and mobility: Secondary | ICD-10-CM

## 2018-07-01 DIAGNOSIS — R208 Other disturbances of skin sensation: Secondary | ICD-10-CM
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
## 2018-07-04 ENCOUNTER — Ambulatory Visit: Payer: 59 | Admitting: Physical Therapy

## 2018-07-04 ENCOUNTER — Encounter: Payer: Self-pay | Admitting: Occupational Therapy

## 2018-07-04 ENCOUNTER — Ambulatory Visit: Payer: 59 | Admitting: Occupational Therapy

## 2018-07-04 ENCOUNTER — Encounter: Payer: Self-pay | Admitting: Physical Therapy

## 2018-07-04 DIAGNOSIS — R29818 Other symptoms and signs involving the nervous system: Secondary | ICD-10-CM

## 2018-07-04 DIAGNOSIS — R2689 Other abnormalities of gait and mobility: Secondary | ICD-10-CM

## 2018-07-04 DIAGNOSIS — M79602 Pain in left arm: Secondary | ICD-10-CM

## 2018-07-04 DIAGNOSIS — R278 Other lack of coordination: Secondary | ICD-10-CM

## 2018-07-04 DIAGNOSIS — R208 Other disturbances of skin sensation: Secondary | ICD-10-CM | POA: Diagnosis not present

## 2018-07-04 DIAGNOSIS — M542 Cervicalgia: Secondary | ICD-10-CM

## 2018-07-04 DIAGNOSIS — M6281 Muscle weakness (generalized): Secondary | ICD-10-CM | POA: Diagnosis not present

## 2018-07-04 DIAGNOSIS — R2681 Unsteadiness on feet: Secondary | ICD-10-CM
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
## 2018-07-04 NOTE — Patient Instructions (Signed)
Access Code: [email or web address]  URL: [email or web address]  Date: 07/04/2018  Prepared by: Sallyanne KusterKathy Jesyka Slaght   Exercises  Supine Cervical Rotation AROM on Pillow - 10 reps - 1 sets - 5 hold - 1x daily - 7x weekly  Supine Chin Tucks on Flat Ball - 10 reps - 1 sets - 5 hold - 1x daily - 7x weekly  Supine Thoracic Mobilization Towel Roll Vertical - 2 reps - 1 sets - 60 sec hold - 1x daily - 5x weekly  Gentle Levator Scapulae Stretch - 3 reps - 1 sets - 20 hold - 1x daily - 7x weekly
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
## 2018-07-08 ENCOUNTER — Ambulatory Visit: Payer: 59 | Admitting: Occupational Therapy

## 2018-07-08 ENCOUNTER — Encounter: Payer: Self-pay | Admitting: Physical Therapy

## 2018-07-08 ENCOUNTER — Ambulatory Visit: Payer: 59 | Admitting: Physical Therapy

## 2018-07-08 ENCOUNTER — Encounter: Payer: Self-pay | Admitting: Occupational Therapy

## 2018-07-08 DIAGNOSIS — R2689 Other abnormalities of gait and mobility: Secondary | ICD-10-CM

## 2018-07-08 DIAGNOSIS — R278 Other lack of coordination: Secondary | ICD-10-CM | POA: Diagnosis not present

## 2018-07-08 DIAGNOSIS — M6281 Muscle weakness (generalized): Secondary | ICD-10-CM

## 2018-07-08 DIAGNOSIS — R2681 Unsteadiness on feet: Secondary | ICD-10-CM

## 2018-07-08 DIAGNOSIS — M79602 Pain in left arm: Secondary | ICD-10-CM | POA: Diagnosis not present

## 2018-07-08 DIAGNOSIS — R208 Other disturbances of skin sensation: Secondary | ICD-10-CM | POA: Diagnosis not present

## 2018-07-08 DIAGNOSIS — M542 Cervicalgia: Secondary | ICD-10-CM | POA: Diagnosis not present

## 2018-07-08 DIAGNOSIS — R29818 Other symptoms and signs involving the nervous system: Secondary | ICD-10-CM | POA: Diagnosis not present
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
## 2018-07-08 NOTE — Patient Instructions (Signed)
   1.  Work on keeping your elbow down when brushing your teeth.  2.  Try to move fingers to wash hair.  3. Loop putty around each finger and thumb and extend   4.  Place and holds:  Use left hand to help bend all 3 joints of fingers of right hand then remove left hand and try to hold hand in a first.  Then do the same thing with straightening.

## 2018-07-08 NOTE — Patient Instructions (Signed)
Access Code: [email or web address]  URL: [email or web address]  Date: 07/08/2018  Prepared by: Veda CanningLynn Sriansh Farra   Exercises  Supine Cervical Rotation AROM on Pillow - 10 reps - 1 sets - 5 hold - 1x daily - 7x weekly  Supine Chin Tucks on Flat Ball - 10 reps - 1 sets - 5 hold - 1x daily - 7x weekly  Supine Thoracic Mobilization Towel Roll Vertical - 2 reps - 1 sets - 60 sec hold - 1x daily - 5x weekly  Gentle Levator Scapulae Stretch - 3 reps - 1 sets - 20 hold - 1x daily - 7x weekly  ADDED TODAY:  Seated Piriformis Stretch - 3 reps - 1 sets - 30 seconds hold - 3x daily - 7x weekly  Supine Figure 4 Piriformis Stretch - 3 reps - 1 sets - 30 seconds hold - 3x daily - 7x weekly  Supine Piriformis Stretch with Foot on Ground - 3 reps - 1 sets - 30 hold - 3x daily - 7x weekly  Supine Bridge with Mini Swiss Ball Between Knees - 10 reps - 3 sets - 5 seconds hold - 2x daily - 7x weekly

## 2018-07-09 ENCOUNTER — Ambulatory Visit: Payer: 59 | Admitting: Occupational Therapy

## 2018-07-09 ENCOUNTER — Ambulatory Visit: Payer: 59 | Admitting: Physical Therapy

## 2018-07-10 ENCOUNTER — Ambulatory Visit: Payer: 59 | Admitting: Rehabilitative and Restorative Service Providers"

## 2018-07-10 ENCOUNTER — Encounter: Payer: Self-pay | Admitting: Rehabilitative and Restorative Service Providers"

## 2018-07-10 DIAGNOSIS — R2689 Other abnormalities of gait and mobility: Secondary | ICD-10-CM

## 2018-07-10 DIAGNOSIS — M6281 Muscle weakness (generalized): Secondary | ICD-10-CM | POA: Diagnosis not present

## 2018-07-10 DIAGNOSIS — M542 Cervicalgia: Secondary | ICD-10-CM

## 2018-07-10 DIAGNOSIS — R208 Other disturbances of skin sensation: Secondary | ICD-10-CM | POA: Diagnosis not present

## 2018-07-10 DIAGNOSIS — R29818 Other symptoms and signs involving the nervous system: Secondary | ICD-10-CM | POA: Diagnosis not present

## 2018-07-10 DIAGNOSIS — R2681 Unsteadiness on feet: Secondary | ICD-10-CM | POA: Diagnosis not present

## 2018-07-10 DIAGNOSIS — M79602 Pain in left arm: Secondary | ICD-10-CM | POA: Diagnosis not present

## 2018-07-10 DIAGNOSIS — R278 Other lack of coordination: Secondary | ICD-10-CM | POA: Diagnosis not present
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
## 2018-07-11 ENCOUNTER — Encounter: Payer: Self-pay | Admitting: Occupational Therapy

## 2018-07-11 ENCOUNTER — Ambulatory Visit: Payer: 59 | Admitting: Occupational Therapy

## 2018-07-11 DIAGNOSIS — R2681 Unsteadiness on feet: Secondary | ICD-10-CM

## 2018-07-11 DIAGNOSIS — M542 Cervicalgia: Secondary | ICD-10-CM | POA: Diagnosis not present

## 2018-07-11 DIAGNOSIS — R2689 Other abnormalities of gait and mobility: Secondary | ICD-10-CM | POA: Diagnosis not present

## 2018-07-11 DIAGNOSIS — R29818 Other symptoms and signs involving the nervous system: Secondary | ICD-10-CM

## 2018-07-11 DIAGNOSIS — R208 Other disturbances of skin sensation: Secondary | ICD-10-CM

## 2018-07-11 DIAGNOSIS — M6281 Muscle weakness (generalized): Secondary | ICD-10-CM

## 2018-07-11 DIAGNOSIS — M79602 Pain in left arm: Secondary | ICD-10-CM

## 2018-07-11 DIAGNOSIS — R278 Other lack of coordination: Secondary | ICD-10-CM
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
## 2018-07-12 ENCOUNTER — Encounter

## 2018-07-12 ENCOUNTER — Ambulatory Visit: Payer: 59 | Admitting: Physical Therapy

## 2018-07-12 DIAGNOSIS — Z1231 Encounter for screening mammogram for malignant neoplasm of breast: Secondary | ICD-10-CM | POA: Diagnosis not present

## 2018-07-12 DIAGNOSIS — R87610 Atypical squamous cells of undetermined significance on cytologic smear of cervix (ASC-US): Secondary | ICD-10-CM | POA: Diagnosis not present

## 2018-07-12 DIAGNOSIS — N951 Menopausal and female climacteric states: Secondary | ICD-10-CM | POA: Diagnosis not present

## 2018-07-12 DIAGNOSIS — Z124 Encounter for screening for malignant neoplasm of cervix: Secondary | ICD-10-CM | POA: Diagnosis not present

## 2018-07-12 DIAGNOSIS — Z01411 Encounter for gynecological examination (general) (routine) with abnormal findings: Secondary | ICD-10-CM | POA: Diagnosis not present

## 2018-07-12 DIAGNOSIS — Z6821 Body mass index (BMI) 21.0-21.9, adult: Secondary | ICD-10-CM | POA: Diagnosis not present

## 2018-07-12 MED FILL — PREGABALIN 100 MG CAPS: 100 | 30 days supply | Qty: 60 | Fill #1

## 2018-07-16 ENCOUNTER — Encounter: Payer: Self-pay | Admitting: Physical Therapy

## 2018-07-16 ENCOUNTER — Encounter: Payer: Self-pay | Admitting: Occupational Therapy

## 2018-07-16 ENCOUNTER — Ambulatory Visit: Payer: 59 | Admitting: Occupational Therapy

## 2018-07-16 ENCOUNTER — Ambulatory Visit: Payer: 59 | Admitting: Physical Therapy

## 2018-07-16 DIAGNOSIS — M79602 Pain in left arm: Secondary | ICD-10-CM | POA: Diagnosis not present

## 2018-07-16 DIAGNOSIS — M6281 Muscle weakness (generalized): Secondary | ICD-10-CM

## 2018-07-16 DIAGNOSIS — M542 Cervicalgia: Secondary | ICD-10-CM | POA: Diagnosis not present

## 2018-07-16 DIAGNOSIS — R2689 Other abnormalities of gait and mobility: Secondary | ICD-10-CM

## 2018-07-16 DIAGNOSIS — R2681 Unsteadiness on feet: Secondary | ICD-10-CM | POA: Diagnosis not present

## 2018-07-16 DIAGNOSIS — R278 Other lack of coordination: Secondary | ICD-10-CM

## 2018-07-16 DIAGNOSIS — R208 Other disturbances of skin sensation: Secondary | ICD-10-CM

## 2018-07-16 DIAGNOSIS — R29818 Other symptoms and signs involving the nervous system: Secondary | ICD-10-CM
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
## 2018-07-17 ENCOUNTER — Ambulatory Visit: Payer: 59 | Admitting: Occupational Therapy

## 2018-07-17 ENCOUNTER — Ambulatory Visit: Payer: 59 | Admitting: Physical Therapy

## 2018-07-17 ENCOUNTER — Encounter: Payer: Self-pay | Admitting: Physical Therapy

## 2018-07-17 DIAGNOSIS — M6281 Muscle weakness (generalized): Secondary | ICD-10-CM

## 2018-07-17 DIAGNOSIS — R2681 Unsteadiness on feet: Secondary | ICD-10-CM | POA: Diagnosis not present

## 2018-07-17 DIAGNOSIS — R29818 Other symptoms and signs involving the nervous system: Secondary | ICD-10-CM | POA: Diagnosis not present

## 2018-07-17 DIAGNOSIS — R208 Other disturbances of skin sensation: Secondary | ICD-10-CM | POA: Diagnosis not present

## 2018-07-17 DIAGNOSIS — M79602 Pain in left arm: Secondary | ICD-10-CM | POA: Diagnosis not present

## 2018-07-17 DIAGNOSIS — R278 Other lack of coordination: Secondary | ICD-10-CM

## 2018-07-17 DIAGNOSIS — R2689 Other abnormalities of gait and mobility: Secondary | ICD-10-CM

## 2018-07-17 DIAGNOSIS — M542 Cervicalgia: Secondary | ICD-10-CM

## 2018-07-17 MED FILL — FLUCONAZOLE 150 MG TABS: 150 | 1 days supply | Qty: 1 | Fill #0
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
## 2018-07-19 ENCOUNTER — Ambulatory Visit: Payer: 59 | Admitting: Physical Therapy

## 2018-07-22 ENCOUNTER — Ambulatory Visit: Payer: 59 | Admitting: Occupational Therapy

## 2018-07-22 ENCOUNTER — Encounter: Payer: Self-pay | Admitting: Occupational Therapy

## 2018-07-22 ENCOUNTER — Ambulatory Visit: Payer: 59 | Admitting: Rehabilitative and Restorative Service Providers"

## 2018-07-22 DIAGNOSIS — R29818 Other symptoms and signs involving the nervous system: Secondary | ICD-10-CM | POA: Diagnosis not present

## 2018-07-22 DIAGNOSIS — R2689 Other abnormalities of gait and mobility: Secondary | ICD-10-CM | POA: Diagnosis not present

## 2018-07-22 DIAGNOSIS — R278 Other lack of coordination: Secondary | ICD-10-CM

## 2018-07-22 DIAGNOSIS — M542 Cervicalgia: Secondary | ICD-10-CM | POA: Diagnosis not present

## 2018-07-22 DIAGNOSIS — R2681 Unsteadiness on feet: Secondary | ICD-10-CM | POA: Diagnosis not present

## 2018-07-22 DIAGNOSIS — M79602 Pain in left arm: Secondary | ICD-10-CM

## 2018-07-22 DIAGNOSIS — M6281 Muscle weakness (generalized): Secondary | ICD-10-CM | POA: Diagnosis not present

## 2018-07-22 DIAGNOSIS — R208 Other disturbances of skin sensation: Secondary | ICD-10-CM | POA: Diagnosis not present

## 2018-07-22 NOTE — Therapy (Signed)
[facility] [address] [place], [place], [address] Phone: [phone]   Fax:  [phone]  Physical Therapy Treatment  Patient Details  Name: Kelly Wall MRN: [id number] Date of Birth: 09-21-1961 Referring Provider: Dr. Faith Rogue   Encounter Date: 07/22/2018  PT End of Session - 07/22/18 1413    Visit Number  21    Number of Visits  25   recert 07/01/18   Date for PT Re-Evaluation  08/02/18    Authorization Type  UMR    PT Start Time  1412    PT Stop Time  1450    PT Time Calculation (min)  38 min    Equipment Utilized During Treatment  Gait belt    Activity Tolerance  Patient tolerated treatment well;No increased pain    Behavior During Therapy  WFL for tasks assessed/performed       Past Medical History:  Diagnosis Date  . Asthma    child- occ exersie induced now  . Family history of adverse reaction to anesthesia    dad hard to awaken  . Hypertension    no rx  in 5 yrs fish oil helps now  . Medical history non-contributory    no anesthesia    Past Surgical History:  Procedure Laterality Date  . ANTERIOR CERVICAL DECOMP/DISCECTOMY FUSION N/A 03/30/2018   Procedure: ANTERIOR CERVICAL DECOMPRESSION/DISCECTOMY FUSION Cervical four-five and Cervical five-six;  Surgeon: Coletta Memos, MD;  Location: [facility] OR;  Service: Neurosurgery;  Laterality: N/A;  . CERVICAL DISC ARTHROPLASTY N/A 04/20/2016   Procedure: Cervical Six-Seven Artificial Disc Replacement-Cervical;  Surgeon: Tia Alert, MD;  Location: [facility];  Service: Neurosurgery;  Laterality: N/A;    There were no vitals filed for this visit.  Subjective Assessment - 07/22/18 1411    Subjective  The patient notes stiffness bad in the morning, and also she notes a sensation of resistance in the right side with walking, especially in cooler weather.    Patient Stated Goals  regain her independence (to live alone again); improve her leg  strength to reduce Rt knee pain    Currently in Pain?  Yes    Pain Score  6    *see OT notes   Pain Location  Arm        [facility] Adult PT Treatment/Exercise - 07/22/18 1503      Ambulation/Gait   Ambulation/Gait  Yes    Ambulation/Gait Assistance  6: Modified independent (Device/Increase time)    Ambulation/Gait Assistance Details  PT trialed Bioness today due to subjective complaints of tightness with temperature changes, increased muscle tone in the morning, and a general heaviness noted in the right leg.  Patient notes she can maintain a more "normal" pattern when trialing today.    Ambulation Distance (Feet)  800 Feet    Assistive device  None    Gait Pattern  Step-through pattern;Narrow base of support;Lateral hip instability    Ambulation Surface  Level      Self-Care   Self-Care  Other Self-Care Comments    Other Self-Care Comments   Patient inquired about tightness in right foot/ toes and noting dec'd muscle movement worse in the morning due to spasms.  We discussed oral meds for tone (patient was on baclofen per report, but it made her tired).  PT recomended she f/u with Dr. Rosalyn Charters office about tone medication.        Exercises   Exercises  Knee/Hip  Knee/Hip Exercises: Prone   Hamstring Curl  5 reps;2 sets    Hamstring Curl Limitations  added some manual resistance to tolerance    Hip Extension  5 reps    Hip Extension Limitations  then performed with knee flexion (with PT assisting maintaining knee flexion)    Other Prone Exercises  Quadriped hip extension + knee flexion x 10 reps with assist, sidelying hip abduction 4/5 MMT, hip extension R 3/5, knee flexion R is 3+/5.      Programme researcher, broadcasting/film/videolectrical Stimulation   Electrical Stimulation Location  anterior tibialis    Electrical Stimulation Action  improve timing of gait to work through increased muscle tone and encourage more reciprocal pattern    Electrical Stimulation Parameters  bioness set at intensity of 12    Electrical  Stimulation Goals  Tone   motor timing     *PT inquired about Bioness contraindications and precautions prior to use to ensure safety.        PT Long Term Goals - 07/08/18 2013      PT LONG TERM GOAL #1   Title  Patient is independent with HEP including focus on neck stretching and strengthening to reduce neck pain. (Target for all LTGs 08/02/18)    Time  4    Period  Weeks    Status  New      PT LONG TERM GOAL #2   Title  Patient will improve rt neck rotation to >=65 degrees and left rotation >= 70 degrees for improved ability to scan environment with walking or driving.     Baseline  07/01/18 rt 52 degrees, lt 60 degrees    Time  4    Period  Weeks    Status  New      PT LONG TERM GOAL #3   Title  Patient will ambulate independently over unlevel outdoor surfaces (including slopes/hill, curbs, grass, mulch) x 10600ft    Time  4    Period  Weeks    Status  New            Plan - 07/22/18 1514    Clinical Impression Statement  The patient is c/o muscle tone in the morning and during temperature change this weekend (weather cooled off).  PT recommended f/u with MD re: tone medications.  Also, trialed bioness for purpose of moving through leg tightness and determine if motor timing with device improved.  Patient notes she can walk faster with Bioness donned.   Current focus is more on neck activities to reduce stiffness and improve ROM for scanning.  PT to check HEP next session and then consider assessing gait speed with bioness donned.     PT Treatment/Interventions  ADLs/Self Care Home Management;Aquatic Therapy;Gait training;Electrical Stimulation;DME Instruction;Stair training;Functional mobility training;Therapeutic activities;Therapeutic exercise;Balance training;Neuromuscular re-education;Manual techniques;Patient/family education;Passive range of motion;Biofeedback;Moist Heat;Traction;Taping;Dry needling;Scar mobilization    PT Next Visit Plan  Continue to address  cervical pain, tightness, and strength as needed; walking with head turns/gait over unlevel ground with turning head to scan environment.  Check HEP/ ? gait speed with bioness donned.    Consulted and Agree with Plan of Care  Patient       Patient will benefit from skilled therapeutic intervention in order to improve the following deficits and impairments:  Abnormal gait, Decreased activity tolerance, Decreased balance, Decreased mobility, Decreased strength, Impaired sensation, Impaired UE functional use, Pain, Decreased knowledge of use of DME, Decreased scar mobility, Increased muscle spasms, Increased fascial restricitons  Visit Diagnosis: Muscle  weakness (generalized)  Other abnormalities of gait and mobility  Cervicalgia  Other symptoms and signs involving the nervous system  Unsteadiness on feet     Problem List Patient Active Problem List   Diagnosis Date Noted  . Cervical myelopathy (HCC) 04/02/2018  . Bilateral arm weakness   . Numbness and tingling in both hands   . Paresthesia and pain of both upper extremities   . Trauma   . Post-operative pain   . Uncomplicated asthma   . Benign essential HTN   . Bradycardia   . Steroid-induced hyperglycemia   . 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 of cervical spinal cord (HCC) 03/30/2018  . S/P cervical spinal fusion 04/20/2016  . Cervical radicular pain 03/22/2016  . Hamstring tightness 12/11/2014  . 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 Pain in joint, upper arm 11/17/2014  . Labyrinthitis 05/15/2012  . Sinusitis acute 02/12/2012  . Well adult exam 06/27/2011  . Rash, skin 06/27/2011  . ADHESIVE CAPSULITIS, LEFT 11/09/2009  . SHOULDER PAIN 09/09/2009  . HAIR LOSS 07/24/2008  . ELEVATED BP 07/24/2008  . CARPAL TUNNEL SYNDROME 03/23/2008  . PARESTHESIA 03/23/2008  . MIGRAINE HEADACHE 08/02/2007    Kelly Wall, PT 07/22/2018, 3:17 PM  [facility] [address] [place], [place], [address] Phone: [phone]   Fax:  [phone]  Name: Kelly Wall MRN: [id number] Date of Birth: July 14, 1961
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
## 2018-07-23 ENCOUNTER — Encounter: Payer: 59 | Admitting: Occupational Therapy

## 2018-07-23 ENCOUNTER — Ambulatory Visit: Payer: 59 | Admitting: Physical Therapy

## 2018-07-24 ENCOUNTER — Telehealth: Payer: Self-pay

## 2018-07-24 ENCOUNTER — Encounter: Payer: Self-pay | Admitting: Rehabilitative and Restorative Service Providers"

## 2018-07-24 ENCOUNTER — Encounter: Payer: Self-pay | Admitting: Occupational Therapy

## 2018-07-24 ENCOUNTER — Ambulatory Visit: Payer: 59 | Admitting: Occupational Therapy

## 2018-07-24 ENCOUNTER — Ambulatory Visit: Payer: 59 | Admitting: Rehabilitative and Restorative Service Providers"

## 2018-07-24 DIAGNOSIS — R278 Other lack of coordination: Secondary | ICD-10-CM | POA: Diagnosis not present

## 2018-07-24 DIAGNOSIS — R208 Other disturbances of skin sensation: Secondary | ICD-10-CM | POA: Diagnosis not present

## 2018-07-24 DIAGNOSIS — R2681 Unsteadiness on feet: Secondary | ICD-10-CM | POA: Diagnosis not present

## 2018-07-24 DIAGNOSIS — M542 Cervicalgia: Secondary | ICD-10-CM | POA: Diagnosis not present

## 2018-07-24 DIAGNOSIS — R2689 Other abnormalities of gait and mobility: Secondary | ICD-10-CM | POA: Diagnosis not present

## 2018-07-24 DIAGNOSIS — M79602 Pain in left arm: Secondary | ICD-10-CM | POA: Diagnosis not present

## 2018-07-24 DIAGNOSIS — M6281 Muscle weakness (generalized): Secondary | ICD-10-CM | POA: Diagnosis not present

## 2018-07-24 DIAGNOSIS — R29818 Other symptoms and signs involving the nervous system: Secondary | ICD-10-CM

## 2018-07-24 MED ORDER — BACLOFEN 10 MG PO TABS: 10.0000 mg | ORAL_TABLET | Freq: Three times a day (TID) | ORAL | 1 refills | Status: DC | PRN

## 2018-07-24 MED FILL — BACLOFEN 10 MG TABLET: 10 | 30 days supply | Qty: 90 | Fill #0

## 2018-07-24 NOTE — Telephone Encounter (Signed)
Pt called requesting a refill on Baclofen. Pt notified sent to pharmacy.
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
## 2018-07-26 ENCOUNTER — Ambulatory Visit: Payer: 59 | Admitting: Physical Therapy

## 2018-07-26 ENCOUNTER — Telehealth: Payer: Self-pay | Admitting: Physical Medicine & Rehabilitation

## 2018-07-26 NOTE — Telephone Encounter (Signed)
Baclofen was refilled this week.

## 2018-08-01 ENCOUNTER — Ambulatory Visit: Payer: 59 | Attending: Physical Medicine & Rehabilitation | Admitting: Rehabilitative and Restorative Service Providers"

## 2018-08-02 ENCOUNTER — Ambulatory Visit: Payer: 59 | Admitting: Physical Therapy

## 2018-08-12 ENCOUNTER — Encounter: Payer: 59 | Attending: Physical Medicine & Rehabilitation | Admitting: Physical Medicine & Rehabilitation

## 2018-08-12 DIAGNOSIS — Z9889 Other specified postprocedural states: Secondary | ICD-10-CM | POA: Insufficient documentation

## 2018-08-12 DIAGNOSIS — M79602 Pain in left arm: Secondary | ICD-10-CM | POA: Insufficient documentation

## 2018-08-12 DIAGNOSIS — G9589 Other specified diseases of spinal cord: Secondary | ICD-10-CM | POA: Insufficient documentation

## 2018-08-12 DIAGNOSIS — K592 Neurogenic bowel, not elsewhere classified: Secondary | ICD-10-CM | POA: Insufficient documentation

## 2018-08-12 DIAGNOSIS — I10 Essential (primary) hypertension: Secondary | ICD-10-CM | POA: Insufficient documentation

## 2018-08-12 DIAGNOSIS — M4322 Fusion of spine, cervical region: Secondary | ICD-10-CM | POA: Insufficient documentation

## 2018-08-12 DIAGNOSIS — J45909 Unspecified asthma, uncomplicated: Secondary | ICD-10-CM | POA: Insufficient documentation

## 2018-08-13 ENCOUNTER — Encounter: Payer: Self-pay | Admitting: Physical Medicine & Rehabilitation

## 2018-08-13 ENCOUNTER — Encounter (HOSPITAL_BASED_OUTPATIENT_CLINIC_OR_DEPARTMENT_OTHER): Payer: 59 | Admitting: Physical Medicine & Rehabilitation

## 2018-08-13 VITALS — BP 121/69 | HR 72 | Ht 61.0 in | Wt 109.8 lb

## 2018-08-13 DIAGNOSIS — S14125S 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 of cervical spinal cord, sequela: Secondary | ICD-10-CM | POA: Diagnosis not present

## 2018-08-13 DIAGNOSIS — R202 Paresthesia of skin: Secondary | ICD-10-CM | POA: Diagnosis not present

## 2018-08-13 DIAGNOSIS — M79601 Pain in right arm: Secondary | ICD-10-CM

## 2018-08-13 DIAGNOSIS — M79602 Pain in left arm: Secondary | ICD-10-CM | POA: Diagnosis not present

## 2018-08-13 DIAGNOSIS — Z9889 Other specified postprocedural states: Secondary | ICD-10-CM | POA: Diagnosis not present

## 2018-08-13 DIAGNOSIS — M4322 Fusion of spine, cervical region: Secondary | ICD-10-CM | POA: Diagnosis not present

## 2018-08-13 DIAGNOSIS — G9589 Other specified diseases of spinal cord: Secondary | ICD-10-CM | POA: Diagnosis not present

## 2018-08-13 DIAGNOSIS — J45909 Unspecified asthma, uncomplicated: Secondary | ICD-10-CM | POA: Diagnosis not present

## 2018-08-13 DIAGNOSIS — I10 Essential (primary) hypertension: Secondary | ICD-10-CM | POA: Diagnosis not present

## 2018-08-13 DIAGNOSIS — K592 Neurogenic bowel, not elsewhere classified: Secondary | ICD-10-CM | POA: Diagnosis not present

## 2018-08-13 MED ORDER — PREGABALIN 150 MG PO CAPS: 150.0000 mg | ORAL_CAPSULE | Freq: Two times a day (BID) | ORAL | 2 refills | Status: DC

## 2018-08-13 MED FILL — PREGABALIN 150 MG CAPS: 150 | 30 days supply | Qty: 60 | Fill #0

## 2018-08-13 NOTE — Patient Instructions (Signed)
PLEASE FEEL FREE TO CALL OUR OFFICE WITH ANY PROBLEMS OR QUESTIONS [phone])     BOWELS TRY SENOKOT-S 1-2 TABLETS AT NIGHT
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
## 2018-08-19 ENCOUNTER — Ambulatory Visit: Payer: 59 | Admitting: Occupational Therapy

## 2018-08-20 MED FILL — LORYNA 3-0.02 MG TABS: 3-0.02 | 84 days supply | Qty: 84 | Fill #0

## 2018-08-26 ENCOUNTER — Encounter: Payer: 59 | Admitting: Occupational Therapy

## 2018-08-27 DIAGNOSIS — M5412 Radiculopathy, cervical region: Secondary | ICD-10-CM | POA: Diagnosis not present

## 2018-08-28 ENCOUNTER — Ambulatory Visit: Payer: 59 | Attending: Physical Medicine & Rehabilitation | Admitting: Physical Therapy

## 2018-08-28 ENCOUNTER — Encounter: Payer: Self-pay | Admitting: Physical Therapy

## 2018-08-28 DIAGNOSIS — M6281 Muscle weakness (generalized): Secondary | ICD-10-CM

## 2018-08-28 DIAGNOSIS — R2689 Other abnormalities of gait and mobility: Secondary | ICD-10-CM

## 2018-08-28 DIAGNOSIS — R29898 Other symptoms and signs involving the musculoskeletal system: Secondary | ICD-10-CM | POA: Diagnosis not present

## 2018-08-28 DIAGNOSIS — R29818 Other symptoms and signs involving the nervous system: Secondary | ICD-10-CM | POA: Insufficient documentation
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
## 2018-09-02 ENCOUNTER — Encounter: Payer: 59 | Admitting: Occupational Therapy

## 2018-09-05 ENCOUNTER — Encounter: Payer: Self-pay | Admitting: Physical Therapy

## 2018-09-05 ENCOUNTER — Ambulatory Visit: Payer: 59 | Admitting: Physical Therapy

## 2018-09-05 DIAGNOSIS — R29898 Other symptoms and signs involving the musculoskeletal system: Secondary | ICD-10-CM

## 2018-09-05 DIAGNOSIS — R2689 Other abnormalities of gait and mobility: Secondary | ICD-10-CM

## 2018-09-05 DIAGNOSIS — R29818 Other symptoms and signs involving the nervous system: Secondary | ICD-10-CM | POA: Diagnosis not present

## 2018-09-05 DIAGNOSIS — M6281 Muscle weakness (generalized): Secondary | ICD-10-CM | POA: Diagnosis not present

## 2018-09-05 NOTE — Therapy (Signed)
[facility] [address] [place], [place], [address] Phone: [phone]   Fax:  [phone]  Physical Therapy Treatment  Patient Details  Name: Kelly Wall MRN: [id number] Date of Birth: [birth year] Referring Provider (PT): Dr. Faith Rogue   Encounter Date: 09/05/2018  PT End of Session - 09/05/18 1641    Visit Number  24    Number of Visits  29    Date for PT Re-Evaluation  09/27/18    PT Start Time  1546    PT Stop Time  1630    PT Time Calculation (min)  44 min    Activity Tolerance  Patient tolerated treatment well    Behavior During Therapy  [facility] for tasks assessed/performed       Past Medical History:  Diagnosis Date  . Asthma    child- occ exersie induced now  . Family history of adverse reaction to anesthesia    dad hard to awaken  . Hypertension    no rx  in 5 yrs fish oil helps now  . Medical history non-contributory    no anesthesia    Past Surgical History:  Procedure Laterality Date  . ANTERIOR CERVICAL DECOMP/DISCECTOMY FUSION N/A 03/30/2018   Procedure: ANTERIOR CERVICAL DECOMPRESSION/DISCECTOMY FUSION Cervical four-five and Cervical five-six;  Surgeon: Coletta Memos, MD;  Location: [facility] OR;  Service: Neurosurgery;  Laterality: N/A;  . CERVICAL DISC ARTHROPLASTY N/A 04/20/2016   Procedure: Cervical Six-Seven Artificial Disc Replacement-Cervical;  Surgeon: Tia Alert, MD;  Location: [facility];  Service: Neurosurgery;  Laterality: N/A;    There were no vitals filed for this visit.  Subjective Assessment - 09/05/18 1550    Subjective  " I am having increased soreness in the R hp and leg and it gets fatigued, and the pain is at 6/10"     Currently in Pain?  Yes    Pain Score  6     Pain Orientation  Right    Pain Descriptors / Indicators  Sharp    Pain Onset  More than a month ago    Pain Frequency  Intermittent         [facility] PT Assessment - 09/05/18 0001      Special Tests   Special Tests  Sacrolliac Tests    Sacroiliac Tests   Sacral Thrust      Pelvic Dictraction   Findings  Positive    Comment  increased pain in the R SIJ      Pelvic Compression   Findings  Positive    comment  for relief of pain at the R SIJ      Sacral thrust    Findings  Positive    Side  Right                   [facility] Adult PT Treatment/Exercise - 09/05/18 1612      Knee/Hip Exercises: Stretches   ITB Stretch  2 reps;30 seconds   R hip     Knee/Hip Exercises: Seated   Sit to Sand  1 set;10 reps;without UE support   with ball squeeze     Knee/Hip Exercises: Supine   Other Supine Knee/Hip Exercises  posterior pelvic tilt with ball squeeze and bil hip flexion 1 x 10       Knee/Hip Exercises: Sidelying   Hip ADduction  2 sets;15 reps;Both;Strengthening   ball squeeze      Manual Therapy   Manual Therapy  Joint mobilization  Manual therapy comments  MTPR along the along the glute medius    Joint Mobilization  innominate inflare grade mob grade 3 performed in combination with sidelying hp adduction, L inferior angel sacral mobs grade 3 (due to slight sacral     Soft tissue mobilization  IASTM along the glute medius             PT Education - 09/05/18 1640    Education Details  updated HEP for hip adductor strengthening and glute med manual trigger point release    Person(s) Educated  Patient    Methods  Explanation;Verbal cues;Handout    Comprehension  Verbalized understanding;Verbal cues required          PT Long Term Goals - 08/28/18 1619      PT LONG TERM GOAL #1   Title  Patient is independent with updated HEP (Target all LTGs 09/27/2018)    Time  4    Period  Weeks    Status  New      PT LONG TERM GOAL #2   Title  Patient's right hip abduction strength will increase to 3+/5 and left hip abduction to 4+/5 with no left trendelenberg with walking.     Baseline  10/2  3-/5    Time  4    Period  Weeks    Status  New      PT LONG TERM  GOAL #3   Title  Patient will maintain normal pelvic alignment (as indicated by level ASIS or iliac crests) and report no left SI joint pain with activities.     Time  4    Period  Weeks    Status  New            Plan - 09/05/18 1729    Clinical Impression Statement  pt noted 6/10 in the R low back along the R SIJ. special testing was positive for possible inflare on the R, which following MTPR of the glute medius and activation of the adductors she report relief of pain. worked on core encorporating adductor activation as well as functional sit to stand. end of session she noted 1/10 pain.     PT Next Visit Plan  re-assess pelvic alignment, possible inflare on R. work on single leg stance with maintaining level pelvis; update HEP for hip abductor strengthening; confirm pool appointments       Patient will benefit from skilled therapeutic intervention in order to improve the following deficits and impairments:  Abnormal gait, Decreased activity tolerance, Decreased balance, Decreased mobility, Decreased strength, Impaired sensation, Impaired UE functional use, Pain, Decreased knowledge of use of DME, Decreased scar mobility, Increased muscle spasms, Increased fascial restricitons  Visit Diagnosis: Muscle weakness (generalized)  Other abnormalities of gait and mobility  Other symptoms and signs involving the nervous system  Other symptoms and signs involving the musculoskeletal system     Problem List Patient Active Problem List   Diagnosis Date Noted  . Cervical myelopathy (HCC) 04/02/2018  . Bilateral arm weakness   . Numbness and tingling in both hands   . Paresthesia and pain of both upper extremities   . Trauma   . Post-operative pain   . Uncomplicated asthma   . Benign essential HTN   . Bradycardia   . Steroid-induced hyperglycemia   . 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 of cervical spinal cord (HCC) 03/30/2018  . S/P cervical spinal fusion 04/20/2016  . Cervical  radicular pain 03/22/2016  . Hamstring tightness 12/11/2014  .  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 Pain in joint, upper arm 11/17/2014  . Labyrinthitis 05/15/2012  . Sinusitis acute 02/12/2012  . Well adult exam 06/27/2011  . Rash, skin 06/27/2011  . ADHESIVE CAPSULITIS, LEFT 11/09/2009  . SHOULDER PAIN 09/09/2009  . HAIR LOSS 07/24/2008  . ELEVATED BP 07/24/2008  . CARPAL TUNNEL SYNDROME 03/23/2008  . PARESTHESIA 03/23/2008  . MIGRAINE HEADACHE 08/02/2007   Lulu Riding PT, DPT, LAT, ATC  09/05/18  5:45 PM      [facility] [address] [place], [place], [address] Phone: [phone]   Fax:  [phone]  Name: LAVON HORN MRN: [id number] Date of Birth: [birth year]

## 2018-09-10 MED FILL — CLINDAMYCIN PHOS-BENZOYL PE: 1-5 | 30 days supply | Qty: 25 | Fill #3

## 2018-09-10 MED FILL — PREGABALIN 150 MG CAPS: 150 | 30 days supply | Qty: 60 | Fill #1

## 2018-09-20 ENCOUNTER — Encounter: Payer: Self-pay | Admitting: Physical Therapy

## 2018-09-20 ENCOUNTER — Ambulatory Visit: Payer: 59 | Admitting: Physical Therapy

## 2018-09-20 DIAGNOSIS — M6281 Muscle weakness (generalized): Secondary | ICD-10-CM | POA: Diagnosis not present

## 2018-09-20 DIAGNOSIS — R29898 Other symptoms and signs involving the musculoskeletal system: Secondary | ICD-10-CM | POA: Diagnosis not present

## 2018-09-20 DIAGNOSIS — R29818 Other symptoms and signs involving the nervous system: Secondary | ICD-10-CM | POA: Diagnosis not present

## 2018-09-20 DIAGNOSIS — R2689 Other abnormalities of gait and mobility: Secondary | ICD-10-CM | POA: Diagnosis not present

## 2018-09-20 NOTE — Patient Instructions (Addendum)
  Access Code: [email or web address]  URL: [email or web address]  Date: 09/22/2018  Prepared by: Veda Canning   Exercises  Supine Hip Adduction Isometric with Newman Pies - 10 reps - 2 sets - 5 seconds hold - 1x daily - 7x weekly  Seated Hip Adduction Isometrics with Ball - 10 reps - 2 sets - 5 seconds hold - 1x daily - 7x weekly  Sit to Stand with Newman Pies Between Knees - 10 reps - 2 sets - 1x daily - 7x weekly   Added today for realigning pelvis: Prone isometric left hip flexion - 3 reps - 1 sets - 6-10 seconds hold - 1x daily - 7x weekly  Right hip resisted isometric extension - 3 reps - 1 sets - 6-10 seconds hold - 1x daily - 7x weekly
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
## 2018-09-23 ENCOUNTER — Encounter: Payer: Self-pay | Admitting: Occupational Therapy

## 2018-09-23 NOTE — Therapy (Signed)
[facility] [address] [place], [place], [address] Phone: [phone]   Fax:  [phone]  Patient Details  Name: Kelly Wall MRN: [id number] Date of Birth: [birth year] Referring Provider:  No ref. provider found  Encounter Date: 09/23/2018   OCCUPATIONAL THERAPY DISCHARGE SUMMARY  Visits from Start of Care: 21  Current functional level related to goals / functional outcomes: OT Short Term Goals - 07/24/18 1341      OT SHORT TERM GOAL #1   Title  Pt will be independent with initial HEP.--check STGs 05/30/18    Time  4    Period  Weeks    Status  Achieved      OT SHORT TERM GOAL #2   Title  Pt will be able to cut food mod I.    Time  4    Period  Weeks    Status  Achieved   07/04/18:  difficulty, particularly with chopping.  07/24/18     OT SHORT TERM GOAL #3   Title  Pt will be able to shower mod I.    Time  4    Period  Weeks    Status  Achieved   05/14/18     OT SHORT TERM GOAL #4   Title  Pt will improve coordination for ADLs as shown by improving time on 9-hole peg test by at [place] bilaterally.    Baseline  R-34.41, L-55.12sec    Time  4    Period  Weeks    Status  Partially Met   RUE  29.06 LUE 31.47 SECS, met for LUE only.  07/04/18:  R- 35.13sec, 36.97sec, L-30.03sec.  R-33.47sec, L-30.54      OT SHORT TERM GOAL #5   Title  Pt will improve bilateral grip strength to at least 15lbs to assist in ADLs.    Baseline  R-3lbs, L-6lbs    Time  4    Period  Weeks    Status  Achieved   05/28/18:  Partially met.  R-3lbs, L-18lbs.  07/04/18:  R-19lbs, L-31     OT Long Term Goals - 07/24/18 1330      OT LONG TERM GOAL #1   Title  Pt will be independent with updated HEP.--check LTGs 06/30/18, 07/31/18    Time  12    Period  Weeks    Status  Met    would benefit from additional updates intermittently, as she progresses     OT LONG TERM GOAL #2   Title  Pt will be able to fasten/unfasten 3 buttons in  less than 40sec.    Baseline  77.72sec    Time  12    Period  Weeks    Status Not met  07/04/18:  77.72sec.    07/24/18:  60.31sec     OT LONG TERM GOAL #3   Title  Pt will demo ability to type/use mouse sufficient to perform work tasks.      Time  12    Period  Weeks    Status  Partially Met   07/24/18:  incr time/difficulty     OT LONG TERM GOAL #4   Title  Pt will be able to retrieve 2lb object from overhead shelf safely with LUE.    Time  12    Period  Weeks    Status  Achieved      OT LONG TERM GOAL #5   Title  Pt will improve bilateral grip strength  to at least 25lbs for ADLs.    Baseline  R-3lbs, L-6lbs    Time  12    Period  Weeks    Status  Not met   07/04/18:  R-19lbs, L-31lbs.  07/22/2018 Partially met.   L = 22 pounds, R = 35 pounds     OT LONG TERM GOAL #6   Title  Pt will demo at least 6lbs tip pinch for ADLs bilaterally.    Baseline  1.5lbs bilaterally    Time  12    Period  Weeks    Status  Not met   07/04/18:  R-4.5, L-4.5.  07/24/18:  Partially met R-7lbs, 4.5lbs     OT LONG TERM GOAL #7   Title  Pt will perform simple-mod complex cooking/home maintenance tasks safely mod I.    Time  12    Period  Weeks    Status  Achieved   07/04/18:  not fully met (difficulty peeling, chopping, unable to put things in oven, unable to carry heavier pots.  07/24/18     OT LONG TERM GOAL #8   Title  Pt will improve L hand coordination as shown by completing 9-hole peg test in 35sec or less.    Baseline  55.12sec    Time  12    Period  Weeks    Status  Achieved   07/04/18:  30.03sec        Remaining deficits: decr sensation, decr coordination, decr strength, pain, spasticity affecting UE functional use   Education / Equipment: Pt instructed in updated HEP, strategies for ADLs.  Pt verbalized understanding of all education provided.  Plan: Patient agrees to discharge.  Patient goals were partially met. Patient is being discharged due to being pleased with the current  functional level.  Pt has returned to work part time and does not feel she needs to return to OT.      [facility] 09/23/2018, 1:26 PM  [place] [address] [place], [place], [address] Phone: [phone]   Fax:  [place], OTR/L [facility] [address] [place], [place]  [address] [phone] phone [phone] 09/23/18 1:26 PM

## 2018-09-25 ENCOUNTER — Encounter: Payer: Self-pay | Admitting: Physical Therapy

## 2018-09-25 ENCOUNTER — Ambulatory Visit: Payer: 59 | Admitting: Physical Therapy

## 2018-09-25 DIAGNOSIS — M6281 Muscle weakness (generalized): Secondary | ICD-10-CM

## 2018-09-25 DIAGNOSIS — R29898 Other symptoms and signs involving the musculoskeletal system: Secondary | ICD-10-CM

## 2018-09-25 DIAGNOSIS — R2689 Other abnormalities of gait and mobility: Secondary | ICD-10-CM | POA: Diagnosis not present

## 2018-09-25 DIAGNOSIS — R29818 Other symptoms and signs involving the nervous system: Secondary | ICD-10-CM | POA: Diagnosis not present

## 2018-09-25 NOTE — Therapy (Signed)
[place], [place], [address] Phone: [phone]   Fax:  [phone]  Physical Therapy Treatment  Patient Details  Name: Kelly Wall MRN: [id number] Date of Birth: 06/04/1961 Referring Provider (PT): Dr. Alger Simons   Encounter Date: 09/25/2018  PT End of Session - 09/25/18 1549    Visit Number  26    Number of Visits  29    Date for PT Re-Evaluation  09/27/18    Authorization Type  UMR    PT Start Time  1549    PT Stop Time  1629    PT Time Calculation (min)  40 min    Activity Tolerance  Patient tolerated treatment well    Behavior During Therapy  [facility] for tasks assessed/performed       Past Medical History:  Diagnosis Date  . Asthma    child- occ exersie induced now  . Family history of adverse reaction to anesthesia    dad hard to awaken  . Hypertension    no rx  in 5 yrs fish oil helps now  . Medical history non-contributory    no anesthesia    Past Surgical History:  Procedure Laterality Date  . ANTERIOR CERVICAL DECOMP/DISCECTOMY FUSION N/A 03/30/2018   Procedure: ANTERIOR CERVICAL DECOMPRESSION/DISCECTOMY FUSION Cervical four-five and Cervical five-six;  Surgeon: Ashok Pall, MD;  Location: [place];  Service: Neurosurgery;  Laterality: N/A;  . CERVICAL DISC ARTHROPLASTY N/A 04/20/2016   Procedure: Cervical Six-Seven Artificial Disc Replacement-Cervical;  Surgeon: Eustace Moore, MD;  Location: [place] [facility];  Service: Neurosurgery;  Laterality: N/A;    There were no vitals filed for this visit.  Subjective Assessment - 09/25/18 1549    Subjective  " I am feeling more sore today, and I have been wearing the SIJ belt which helps take pressure of my back but its still at 4-5/10 today.     Currently in Pain?  Yes    Pain Score  4     Pain Orientation  Right    Pain Descriptors / Indicators  Aching;Sore    Pain Type  Chronic pain    Pain Onset  More than a month ago    Pain  Frequency  Constant    Aggravating Factors   standing/ walking, bending forward,     Pain Relieving Factors  SIJ belt, meds,                        [facility] Adult PT Treatment/Exercise - 09/25/18 0001      Exercises   Exercises  Knee/Hip      Lumbar Exercises: Supine   Bent Knee Raise  10 reps   x 2 sets keeping core tight throughout session   Other Supine Lumbar Exercises  lower trunk rotation 2 x 10 holding end range x 1 sec      Knee/Hip Exercises: Stretches   Active Hamstring Stretch  3 reps;Right;30 seconds   PNF contract/ relax   ITB Stretch  2 reps;30 seconds      Knee/Hip Exercises: Supine   Straight Leg Raises  1 set;Right;Strengthening;10 reps      Manual Therapy   Manual therapy comments  MTPR along the along the glute medius    Joint Mobilization  LAD grade 5 on RLE    Soft tissue mobilization  IASTM along the glute medius    Myofascial Release  Fascial rolling/ stretching along lateral hip musculature  Muscle Energy Technique  in supine Resisted R hip flexion 5 x 10 sec hold                  PT Long Term Goals - 09/22/18 1642      PT LONG TERM GOAL #1   Title  Patient is independent with updated HEP (Target all LTGs 09/27/2018--updated to 10/04/18 due to delayed scheduling due to census)    Time  4    Period  Weeks    Status  New      PT LONG TERM GOAL #2   Title  Patient's right hip abduction strength will increase to 3+/5 and left hip abduction to 4+/5 with no left trendelenberg with walking.     Baseline  10/2  3-/5    Time  4    Period  Weeks    Status  New      PT LONG TERM GOAL #3   Title  Patient will maintain normal pelvic alignment (as indicated by level ASIS or iliac crests) and report no left SI joint pain with activities.     Time  4    Period  Weeks    Status  New            Plan - 09/25/18 1729    Clinical Impression Statement  patient reported 4/10 pain located at the R SIJ. 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 following hamstring stretching and MET with hip flexor actvation she reported improvemen to pain and symptoms. continued STW along glute medius and exercise to promote hip flexor strength. she reported feeling sore following the session but stated she felt alot better standing and walkng .     PT Treatment/Interventions  ADLs/Self Care Home Management;Aquatic Therapy;Gait training;Electrical Stimulation;DME Instruction;Stair training;Functional mobility training;Therapeutic activities;Therapeutic exercise;Balance training;Neuromuscular re-education;Manual techniques;Patient/family education;Passive range of motion;Biofeedback;Moist Heat;Traction;Taping;Dry needling;Scar mobilization;Compression bandaging    PT Next Visit Plan   re-assess pelvic alignment, reassess need for more visits next session.     Consulted and Agree with Plan of Care  Patient       Patient will benefit from skilled therapeutic intervention in order to improve the following deficits and impairments:  Abnormal gait, Decreased activity tolerance, Decreased balance, Decreased mobility, Decreased strength, Impaired sensation, Impaired UE functional use, Pain, Decreased knowledge of use of DME, Decreased scar mobility, Increased muscle spasms, Increased fascial restricitons  Visit Diagnosis: Muscle weakness (generalized)  Other symptoms and signs involving the musculoskeletal system  Other abnormalities of gait and mobility     Problem List Patient Active Problem List   Diagnosis Date Noted  . Cervical myelopathy ([place]) 04/02/2018  . Bilateral arm weakness   . Numbness and tingling in both hands   . Paresthesia and pain of both upper extremities   . Trauma   . Post-operative pain   . Uncomplicated asthma   . Benign essential HTN   . Bradycardia   . Steroid-induced hyperglycemia   .  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 of cervical spinal cord ([place]) 03/30/2018  . S/P cervical spinal fusion 04/20/2016  . Cervical radicular pain 03/22/2016  . Hamstring tightness 12/11/2014  . 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 Pain in joint, upper arm 11/17/2014  . Labyrinthitis 05/15/2012  . Sinusitis acute 02/12/2012  . Well adult exam 06/27/2011  . Rash, skin 06/27/2011  . ADHESIVE CAPSULITIS, LEFT 11/09/2009  . SHOULDER  PAIN 09/09/2009  . HAIR LOSS 07/24/2008  . ELEVATED BP 07/24/2008  . CARPAL TUNNEL SYNDROME 03/23/2008  . PARESTHESIA 03/23/2008  . MIGRAINE HEADACHE 08/02/2007   [name] PT, DPT, LAT, ATC  09/25/18  5:33 PM      [place] [name] [place], [place], [address] Phone: [phone]   Fax:  [phone]  Name: Kelly Wall MRN: [id number] Date of Birth: Oct 28, 1961

## 2018-09-27 ENCOUNTER — Ambulatory Visit: Payer: 59 | Admitting: Physical Therapy

## 2018-10-03 ENCOUNTER — Telehealth: Payer: Self-pay | Admitting: Physical Therapy

## 2018-10-03 ENCOUNTER — Ambulatory Visit: Payer: 59 | Admitting: Physical Therapy

## 2018-10-03 ENCOUNTER — Encounter: Payer: Self-pay | Admitting: Physical Therapy

## 2018-10-03 DIAGNOSIS — R29818 Other symptoms and signs involving the nervous system: Secondary | ICD-10-CM

## 2018-10-03 DIAGNOSIS — M4322 Fusion of spine, cervical region: Secondary | ICD-10-CM | POA: Diagnosis not present

## 2018-10-03 DIAGNOSIS — R208 Other disturbances of skin sensation: Secondary | ICD-10-CM

## 2018-10-03 DIAGNOSIS — M6281 Muscle weakness (generalized): Secondary | ICD-10-CM

## 2018-10-03 DIAGNOSIS — R2681 Unsteadiness on feet: Secondary | ICD-10-CM | POA: Insufficient documentation

## 2018-10-03 DIAGNOSIS — S14125S 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 of cervical spinal cord, sequela: Secondary | ICD-10-CM

## 2018-10-03 DIAGNOSIS — R2689 Other abnormalities of gait and mobility: Secondary | ICD-10-CM

## 2018-10-03 DIAGNOSIS — G9589 Other specified diseases of spinal cord: Secondary | ICD-10-CM | POA: Diagnosis not present

## 2018-10-03 DIAGNOSIS — K592 Neurogenic bowel, not elsewhere classified: Secondary | ICD-10-CM | POA: Diagnosis not present

## 2018-10-03 DIAGNOSIS — I10 Essential (primary) hypertension: Secondary | ICD-10-CM | POA: Diagnosis not present

## 2018-10-03 DIAGNOSIS — J45909 Unspecified asthma, uncomplicated: Secondary | ICD-10-CM | POA: Diagnosis not present

## 2018-10-03 DIAGNOSIS — R29898 Other symptoms and signs involving the musculoskeletal system: Secondary | ICD-10-CM

## 2018-10-03 DIAGNOSIS — Z9889 Other specified postprocedural states: Secondary | ICD-10-CM | POA: Diagnosis not present

## 2018-10-03 DIAGNOSIS — M79602 Pain in left arm: Secondary | ICD-10-CM | POA: Diagnosis not present

## 2018-10-03 NOTE — Therapy (Signed)
[place], [place], [address] Phone: [phone]   Fax:  [phone]  Physical Therapy Treatment  Kelly Wall Details  Name: Kelly Wall MRN: [id number] Date of Birth: [birth year] Referring Provider (PT): Dr. Alger Simons   Encounter Date: 10/03/2018  PT End of Session - 10/03/18 0933    Visit Number  27    Number of Visits  31    Date for PT Re-Evaluation  11/07/18    Authorization Type  UMR    PT Start Time  0933    PT Stop Time  1015    PT Time Calculation (min)  42 min    Activity Tolerance  Kelly Wall tolerated treatment well    Behavior During Therapy  [facility] for tasks assessed/performed       Past Medical History:  Diagnosis Date  . Asthma    child- occ exersie induced now  . Family history of adverse reaction to anesthesia    dad hard to awaken  . Hypertension    no rx  in 5 yrs fish oil helps now  . Medical history non-contributory    no anesthesia    Past Surgical History:  Procedure Laterality Date  . ANTERIOR CERVICAL DECOMP/DISCECTOMY FUSION N/A 03/30/2018   Procedure: ANTERIOR CERVICAL DECOMPRESSION/DISCECTOMY FUSION Cervical four-five and Cervical five-six;  Surgeon: Ashok Pall, MD;  Location: [place];  Service: Neurosurgery;  Laterality: N/A;  . CERVICAL DISC ARTHROPLASTY N/A 04/20/2016   Procedure: Cervical Six-Seven Artificial Disc Replacement-Cervical;  Surgeon: Eustace Moore, MD;  Location: [place] [facility];  Service: Neurosurgery;  Laterality: N/A;    There were no vitals filed for this visit.      [facility] PT Assessment - 10/03/18 0001      AROM   Overall AROM Comments  R hip is WFL with soreness noted during hip flexion/ abduction     AROM Assessment Site  Hip    Right/Left Hip  Right;Left      Strength   Strength Assessment Site  Knee    Right/Left Hip  Right;Left    Right Hip Flexion  3+/5   soreness during testing   Right Hip Extension  4-/5    Right Hip ABduction   3+/5    Right Hip ADduction  4-/5    Left Hip Flexion  4/5    Right/Left Knee  Right;Left    Right Knee Flexion  4/5    Right Knee Extension  4/5    Left Knee Flexion  4/5    Left Knee Extension  4/5      Special Tests    Special Tests  Leg LengthTest    Sacroiliac Tests   Sacral Thrust    Leg length test   True;Apparent      True   Right  78.7 in.    Left   78.4 in.      Apparent   Right  85 in.    Left  85.5 in.                   Kelly Wall - 10/03/18 0001      Self-Care   Other Self-Care Comments   trialed small heel lift in the R shoe to promote pelvic height/ equality while working on strengthening of the hip of the hip flexors to decresae apparent LLD on the R      Exercises   Exercises  Knee/Hip    Other Exercises   --  Lumbar Exercises: Supine   Pelvic Tilt  10 reps;5 seconds   tactile cues to for proper form   Bent Knee Raise  15 reps   cues to keep core tight andback flat     Knee/Hip Exercises: Stretches   Active Hamstring Stretch  30 seconds;5 reps   PNF contract/ relax   Active Hamstring Stretch Limitations  2 stretched performed in sitting   provided handout     Knee/Hip Exercises: Supine   Straight Leg Raises  2 sets;10 reps;Strengthening;Right    Other Supine Knee/Hip Exercises  sliding R heel of L shin 2 x 10 for iliopsoas strengthening      Manual Therapy   Manual therapy comments  MTPR along semi-membranosus working distal to proximal    Joint Mobilization  LAD grade 5 on RLE    Muscle Energy Technique  in supine Resisted R hip flexion10 x 5  sec hold             PT Education - 10/03/18 1209    Education Details  disucssed avoiding overexercising to prevent any compensation due to fatigue. hold off on hamstring strengthening for now and focus on hip flexor activation to promote hip strength balance.     Kelly Wall  Kelly Wall    Methods  Explanation;Verbal cues    Comprehension  Verbalized  understanding;Verbal cues required          PT Long Term Goals - 10/03/18 1230      PT LONG TERM GOAL #1   Title  Kelly Wall is independent with updated HEP (Target all LTGs 09/27/2018--updated to 10/04/18 due to delayed scheduling due to census)    Baseline   independent with current HEP; progressing as able.    Time  4    Period  Weeks    Status  On-going      PT LONG TERM GOAL #2   Title  Kelly Wall's right hip abduction strength will increase to 3+/5 and left hip abduction to 4+/5 with no left trendelenberg with walking.     Time  4    Period  Weeks    Status  On-going      PT LONG TERM GOAL #3   Title  Kelly Wall will maintain normal pelvic alignment (as indicated by level ASIS or iliac crests) and report no left SI joint pain with activities.     Baseline  continues posteriorly rotated innominate onthe R with apparrant leg length discrepancy    Time  4    Period  Weeks    Status  On-going      PT LONG TERM GOAL #4   Title  Kelly Wall will walk 1000 ft on level outdoor surfaces modified independent without  rt genu recurvatum at least 90% of the time (with or without brace if needed)    Time  4    Period  Weeks    Status  Partially Met      PT LONG TERM GOAL #5   Title  Kelly Wall will ascend/descend 12 steps with step over step pattern with rail modified independent for access to Kelly Wall upstairs room.     Time  4    Period  Weeks    Status  Achieved            Plan - 10/03/18 1212    Clinical Impression Statement  Kelly Wall continues to make progress with decreased pain in the R low back but reports it does fluctuate depending the activities she does. She  demonstrates weakness in the hip flexors and abductors with soreness during testing but demonstrates functional hip mobility. 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 she would benefit from continued physical therapy to decrease R hp pain, promote functional strength and movement  patterns by addressing the deficits listed.     Rehab Potential  Good    PT Frequency  1x / week    PT Duration  4 weeks    PT Treatment/Interventions  ADLs/Self Care Home Management;Aquatic Therapy;Gait training;Electrical Stimulation;DME Instruction;Stair training;Functional mobility training;Therapeutic activities;Therapeutic exercise;Balance training;Neuromuscular re-education;Manual techniques;Kelly Wall/family education;Passive range of motion;Biofeedback;Moist Heat;Traction;Taping;Dry needling;Scar mobilization;Compression bandaging    PT Next Visit Plan   re-assess pelvic alignment, reassess need for more visits next session.     Consulted and Agree with Plan of Care  Kelly Wall       Kelly Wall will benefit from skilled therapeutic intervention in order to improve the following deficits and impairments:  Abnormal gait, Decreased activity tolerance, Decreased balance, Decreased mobility, Decreased strength, Impaired sensation, Impaired UE functional use, Pain, Decreased knowledge of use of DME, Decreased scar mobility, Increased muscle spasms, Increased fascial restricitons  Visit Diagnosis: Muscle weakness (generalized)  Other symptoms and signs involving the musculoskeletal system  Other abnormalities of gait and mobility     Problem List Kelly Wall Active Problem List   Diagnosis Date Noted  . Cervical myelopathy ([place]) 04/02/2018  . Bilateral arm weakness   . Numbness and tingling in both hands   . Paresthesia and pain of both upper extremities   . Trauma   . Post-operative pain   . Uncomplicated asthma   . Benign essential HTN   . Bradycardia   . Steroid-induced hyperglycemia   . 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 of cervical spinal cord ([place]) 03/30/2018  . S/P cervical spinal fusion 04/20/2016  . Cervical radicular pain 03/22/2016  . Hamstring tightness 12/11/2014  . 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 Pain in joint, upper arm 11/17/2014  . Labyrinthitis 05/15/2012  . Sinusitis  acute 02/12/2012  . Well adult exam 06/27/2011  . Rash, skin 06/27/2011  . ADHESIVE CAPSULITIS, LEFT 11/09/2009  . SHOULDER PAIN 09/09/2009  . HAIR LOSS 07/24/2008  . ELEVATED BP 07/24/2008  . CARPAL TUNNEL SYNDROME 03/23/2008  . PARESTHESIA 03/23/2008  . MIGRAINE HEADACHE 08/02/2007   [name] PT, DPT, LAT, ATC  10/03/18  12:39 PM      [facility] [address] [place], [place], [address] Phone: [phone]   Fax:  [phone]  Name: SHADY PADRON MRN: [id number] Date of Birth: Mar 27, 1961

## 2018-10-03 NOTE — Telephone Encounter (Signed)
Dr. Riley Kill,  Kelly Wall has been followed by PT at [facility] (and recently discharged from OT).   PT has been trying to get her into pool therapy, however the PT pool schedule is VERY full. The OT pool schedule has some openings. I have discussed with the OTs and they feel the pool through OT would be an appropriate referral.   The patient would need a new OT evaluation and treat order. Please specify the evaluating OT as Kelly Wall, OTR/L  If you agree, please place an order in [facility] workque in [facility] or fax the order to [phone].   Thank you,  Kelly Wall, PT [facility] [address] [place], [place] [address] [phone]

## 2018-10-03 NOTE — Telephone Encounter (Signed)
done

## 2018-10-04 ENCOUNTER — Ambulatory Visit: Payer: 59 | Admitting: Physical Therapy

## 2018-10-07 ENCOUNTER — Ambulatory Visit: Payer: 59 | Admitting: Occupational Therapy

## 2018-10-07 ENCOUNTER — Encounter: Payer: Self-pay | Admitting: Occupational Therapy

## 2018-10-07 DIAGNOSIS — R2681 Unsteadiness on feet: Secondary | ICD-10-CM

## 2018-10-07 DIAGNOSIS — R208 Other disturbances of skin sensation: Secondary | ICD-10-CM

## 2018-10-07 DIAGNOSIS — M4322 Fusion of spine, cervical region: Secondary | ICD-10-CM | POA: Diagnosis not present

## 2018-10-07 DIAGNOSIS — K592 Neurogenic bowel, not elsewhere classified: Secondary | ICD-10-CM | POA: Diagnosis not present

## 2018-10-07 DIAGNOSIS — I10 Essential (primary) hypertension: Secondary | ICD-10-CM | POA: Diagnosis not present

## 2018-10-07 DIAGNOSIS — G9589 Other specified diseases of spinal cord: Secondary | ICD-10-CM | POA: Diagnosis not present

## 2018-10-07 DIAGNOSIS — M79602 Pain in left arm: Secondary | ICD-10-CM | POA: Diagnosis not present

## 2018-10-07 DIAGNOSIS — J45909 Unspecified asthma, uncomplicated: Secondary | ICD-10-CM | POA: Diagnosis not present

## 2018-10-07 DIAGNOSIS — Z9889 Other specified postprocedural states: Secondary | ICD-10-CM | POA: Diagnosis not present

## 2018-10-07 DIAGNOSIS — M6281 Muscle weakness (generalized): Secondary | ICD-10-CM

## 2018-10-07 DIAGNOSIS — R29898 Other symptoms and signs involving the musculoskeletal system: Secondary | ICD-10-CM

## 2018-10-07 DIAGNOSIS — R29818 Other symptoms and signs involving the nervous system: Secondary | ICD-10-CM
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
## 2018-10-08 ENCOUNTER — Encounter: Payer: Self-pay | Admitting: Occupational Therapy

## 2018-10-08 ENCOUNTER — Ambulatory Visit: Payer: 59 | Admitting: Occupational Therapy

## 2018-10-08 DIAGNOSIS — K592 Neurogenic bowel, not elsewhere classified: Secondary | ICD-10-CM | POA: Diagnosis not present

## 2018-10-08 DIAGNOSIS — M79602 Pain in left arm: Secondary | ICD-10-CM | POA: Diagnosis not present

## 2018-10-08 DIAGNOSIS — R208 Other disturbances of skin sensation: Secondary | ICD-10-CM

## 2018-10-08 DIAGNOSIS — R29818 Other symptoms and signs involving the nervous system: Secondary | ICD-10-CM

## 2018-10-08 DIAGNOSIS — M6281 Muscle weakness (generalized): Secondary | ICD-10-CM

## 2018-10-08 DIAGNOSIS — R2681 Unsteadiness on feet: Secondary | ICD-10-CM

## 2018-10-08 DIAGNOSIS — G9589 Other specified diseases of spinal cord: Secondary | ICD-10-CM | POA: Diagnosis not present

## 2018-10-08 DIAGNOSIS — R29898 Other symptoms and signs involving the musculoskeletal system: Secondary | ICD-10-CM

## 2018-10-08 DIAGNOSIS — Z9889 Other specified postprocedural states: Secondary | ICD-10-CM | POA: Diagnosis not present

## 2018-10-08 DIAGNOSIS — M4322 Fusion of spine, cervical region: Secondary | ICD-10-CM | POA: Diagnosis not present

## 2018-10-08 DIAGNOSIS — J45909 Unspecified asthma, uncomplicated: Secondary | ICD-10-CM | POA: Diagnosis not present

## 2018-10-08 DIAGNOSIS — I10 Essential (primary) hypertension: Secondary | ICD-10-CM | POA: Diagnosis not present
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
## 2018-10-10 MED FILL — PREGABALIN 150 MG CAPS: 150 | 30 days supply | Qty: 60 | Fill #2

## 2018-10-11 ENCOUNTER — Ambulatory Visit: Payer: 59 | Admitting: Physical Therapy

## 2018-10-15 ENCOUNTER — Encounter: Payer: 59 | Attending: Physical Medicine & Rehabilitation | Admitting: Physical Medicine & Rehabilitation

## 2018-10-15 ENCOUNTER — Encounter: Payer: Self-pay | Admitting: Physical Medicine & Rehabilitation

## 2018-10-15 ENCOUNTER — Encounter: Payer: Self-pay | Admitting: Occupational Therapy

## 2018-10-15 ENCOUNTER — Other Ambulatory Visit: Payer: Self-pay

## 2018-10-15 ENCOUNTER — Ambulatory Visit: Payer: 59 | Admitting: Occupational Therapy

## 2018-10-15 VITALS — BP 144/85 | HR 65 | Ht 61.0 in | Wt 115.6 lb

## 2018-10-15 DIAGNOSIS — M79602 Pain in left arm: Secondary | ICD-10-CM | POA: Insufficient documentation

## 2018-10-15 DIAGNOSIS — I10 Essential (primary) hypertension: Secondary | ICD-10-CM | POA: Diagnosis not present

## 2018-10-15 DIAGNOSIS — M533 Sacrococcygeal disorders, not elsewhere classified: Secondary | ICD-10-CM | POA: Diagnosis not present

## 2018-10-15 DIAGNOSIS — R208 Other disturbances of skin sensation: Secondary | ICD-10-CM

## 2018-10-15 DIAGNOSIS — S14125S 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 of cervical spinal cord, sequela: Secondary | ICD-10-CM

## 2018-10-15 DIAGNOSIS — J45909 Unspecified asthma, uncomplicated: Secondary | ICD-10-CM | POA: Insufficient documentation

## 2018-10-15 DIAGNOSIS — R2681 Unsteadiness on feet: Secondary | ICD-10-CM

## 2018-10-15 DIAGNOSIS — M6281 Muscle weakness (generalized): Secondary | ICD-10-CM

## 2018-10-15 DIAGNOSIS — R29898 Other symptoms and signs involving the musculoskeletal system: Secondary | ICD-10-CM

## 2018-10-15 DIAGNOSIS — Z9889 Other specified postprocedural states: Secondary | ICD-10-CM | POA: Insufficient documentation

## 2018-10-15 DIAGNOSIS — R29818 Other symptoms and signs involving the nervous system: Secondary | ICD-10-CM

## 2018-10-15 DIAGNOSIS — G9589 Other specified diseases of spinal cord: Secondary | ICD-10-CM | POA: Insufficient documentation

## 2018-10-15 DIAGNOSIS — K592 Neurogenic bowel, not elsewhere classified: Secondary | ICD-10-CM | POA: Diagnosis not present

## 2018-10-15 DIAGNOSIS — M4322 Fusion of spine, cervical region: Secondary | ICD-10-CM | POA: Diagnosis not present

## 2018-10-15 MED ORDER — CYCLOBENZAPRINE HCL 5 MG PO TABS: 5.0000 mg | ORAL_TABLET | Freq: Three times a day (TID) | ORAL | 2 refills | Status: DC | PRN

## 2018-10-15 MED FILL — CYCLOBENZAPRINE 5 MG TABLET: 5 | 20 days supply | Qty: 60 | Fill #0

## 2018-10-15 NOTE — Patient Instructions (Addendum)
WORK ON MAINTAINING SAFETY AND REALISTIC  EXPECTATIONS AS YOU WORK ON STRENGTH, BALANCE AND COORDINATION.
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
## 2018-10-16 ENCOUNTER — Ambulatory Visit: Payer: 59 | Admitting: Physical Therapy

## 2018-10-16 ENCOUNTER — Encounter: Payer: Self-pay | Admitting: Physical Therapy

## 2018-10-16 DIAGNOSIS — I10 Essential (primary) hypertension: Secondary | ICD-10-CM | POA: Diagnosis not present

## 2018-10-16 DIAGNOSIS — R29818 Other symptoms and signs involving the nervous system: Secondary | ICD-10-CM

## 2018-10-16 DIAGNOSIS — M79602 Pain in left arm: Secondary | ICD-10-CM | POA: Diagnosis not present

## 2018-10-16 DIAGNOSIS — M6281 Muscle weakness (generalized): Secondary | ICD-10-CM

## 2018-10-16 DIAGNOSIS — G9589 Other specified diseases of spinal cord: Secondary | ICD-10-CM | POA: Diagnosis not present

## 2018-10-16 DIAGNOSIS — Z9889 Other specified postprocedural states: Secondary | ICD-10-CM | POA: Diagnosis not present

## 2018-10-16 DIAGNOSIS — M4322 Fusion of spine, cervical region: Secondary | ICD-10-CM | POA: Diagnosis not present

## 2018-10-16 DIAGNOSIS — K592 Neurogenic bowel, not elsewhere classified: Secondary | ICD-10-CM | POA: Diagnosis not present

## 2018-10-16 DIAGNOSIS — J45909 Unspecified asthma, uncomplicated: Secondary | ICD-10-CM | POA: Diagnosis not present

## 2018-10-16 DIAGNOSIS — R2681 Unsteadiness on feet: Secondary | ICD-10-CM

## 2018-10-16 DIAGNOSIS — R29898 Other symptoms and signs involving the musculoskeletal system: Secondary | ICD-10-CM

## 2018-10-16 NOTE — Therapy (Signed)
[facility] [place], [place], [address] Phone: [phone]   Fax:  [phone]  Physical Therapy Treatment  Patient Details  Name: Kelly Wall MRN: [id number] Date of Birth: [birth year] Referring Provider (PT): Dr. Alger Simons   Encounter Date: 10/16/2018  PT End of Session - 10/16/18 1247    Visit Number  28    Number of Visits  31    Date for PT Re-Evaluation  11/07/18    Authorization Type  [facility]    PT Start Time  1102    PT Stop Time  1144    PT Time Calculation (min)  42 min    Activity Tolerance  Patient tolerated treatment well    Behavior During Therapy  [facility] for tasks assessed/performed       Past Medical History:  Diagnosis Date  . Asthma    child- occ exersie induced now  . Family history of adverse reaction to anesthesia    dad hard to awaken  . Hypertension    no rx  in 5 yrs fish oil helps now  . Medical history non-contributory    no anesthesia    Past Surgical History:  Procedure Laterality Date  . ANTERIOR CERVICAL DECOMP/DISCECTOMY FUSION N/A 03/30/2018   Procedure: ANTERIOR CERVICAL DECOMPRESSION/DISCECTOMY FUSION Cervical four-five and Cervical five-six;  Surgeon: Ashok Pall, MD;  Location: [facility];  Service: Neurosurgery;  Laterality: N/A;  . CERVICAL DISC ARTHROPLASTY N/A 04/20/2016   Procedure: Cervical Six-Seven Artificial Disc Replacement-Cervical;  Surgeon: Eustace Moore, MD;  Location: [facility];  Service: Neurosurgery;  Laterality: N/A;    There were no vitals filed for this visit.  Subjective Assessment - 10/16/18 1107    Subjective  "The pool has been going well but does make me alittle sore and I had to use a muscle rub to calm it down"     Currently in Pain?  Yes    Pain Score  4     Pain Orientation  Right;Left    Pain Score  4    Pain Location  Hip    Pain Orientation  Right    Pain Descriptors / Indicators  Sharp;Sore    Pain Type  Chronic pain    Pain  Frequency  Intermittent                       [facility] Adult PT Treatment/Exercise - 10/16/18 1111      Exercises   Exercises  Knee/Hip;Lumbar      Lumbar Exercises: Seated   Other Seated Lumbar Exercises  seated on dynadisc 2 x 10 marching keeping pelvic anteriorly tilted       Knee/Hip Exercises: Stretches   Active Hamstring Stretch  3 reps;30 seconds;Right   PNF contract/ Relax     Knee/Hip Exercises: Aerobic   Nustep  L5 x 5 min UE/LE      Manual Therapy   Manual therapy comments  MTPR along semi-membranosus/ tendionosus  distal to proximal    Joint Mobilization  LAD grade 3 on RLE, anterior innominate mobs grade 3 on the R    Muscle Energy Technique  R hip isometric contraction 5 x 10 sec             PT Education - 10/16/18 1246    Education Details  updated HEP for seated iliopsoas strengthening.     Person(s) Educated  Patient    Methods  Explanation;Verbal cues  Comprehension  Verbalized understanding;Verbal cues required          PT Long Term Goals - 10/03/18 1230      PT LONG TERM GOAL #1   Title  Patient is independent with updated HEP ([facility] all LTGs 09/27/2018--updated to 10/04/18 due to delayed scheduling due to census)    Baseline   independent with current HEP; progressing as able.    Time  4    Period  Weeks    Status  On-going      PT LONG TERM GOAL #2   Title  Patient's right hip abduction strength will increase to 3+/5 and left hip abduction to 4+/5 with no left trendelenberg with walking.     Time  4    Period  Weeks    Status  On-going      PT LONG TERM GOAL #3   Title  Patient will maintain normal pelvic alignment (as indicated by level ASIS or iliac crests) and report no left SI joint pain with activities.     Baseline  continues posteriorly rotated innominate onthe R with apparrant leg length discrepancy    Time  4    Period  Weeks    Status  On-going      PT LONG TERM GOAL #4   Title  Patient will walk 1000 ft on  level outdoor surfaces modified independent without  rt genu recurvatum at least 90% of the time (with or without brace if needed)    Time  4    Period  Weeks    Status  Partially Met      PT LONG TERM GOAL #5   Title  Patient will ascend/descend 12 steps with step over step pattern with rail modified independent for access to her upstairs room.     Time  4    Period  Weeks    Status  Achieved            Plan - 10/16/18 1248    Clinical Impression Statement  Kelly Wall has begun pool therapy and has been to 2-3 visits since being seen in [place]. continued working on knee / hip strengthening folloing MET techniques for the R hip flexor activation and hamstring stretching, continued working on core activation which she performed well. report of decreased pain at end of session.     PT Treatment/Interventions  ADLs/Self Care Home Management;Aquatic Therapy;Gait training;Electrical Stimulation;DME Instruction;Stair training;Functional mobility training;Therapeutic activities;Therapeutic exercise;Balance training;Neuromuscular re-education;Manual techniques;Patient/family education;Passive range of motion;Biofeedback;Moist Heat;Traction;Taping;Dry needling;Scar mobilization;Compression bandaging    PT Next Visit Plan  core strengthening, R hip MET for posterior pelvic rotation, functional activities standing/ sitting,     Consulted and Agree with Plan of Care  Patient       Patient will benefit from skilled therapeutic intervention in order to improve the following deficits and impairments:  Abnormal gait, Decreased activity tolerance, Decreased balance, Decreased mobility, Decreased strength, Impaired sensation, Impaired UE functional use, Pain, Decreased knowledge of use of DME, Decreased scar mobility, Increased muscle spasms, Increased fascial restricitons  Visit Diagnosis: Muscle weakness (generalized)  Other symptoms and signs involving the nervous system  Other symptoms and signs involving  the musculoskeletal system  Unsteadiness on feet     Problem List Patient Active Problem List   Diagnosis Date Noted  . Cervical myelopathy ([place]) 04/02/2018  . Bilateral arm weakness   . Numbness and tingling in both hands   . Paresthesia and pain of both upper extremities   . Trauma   .  Post-operative pain   . Uncomplicated asthma   . Benign essential HTN   . Bradycardia   . Steroid-induced hyperglycemia   . 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 of cervical spinal cord ([place]) 03/30/2018  . S/P cervical spinal fusion 04/20/2016  . Cervical radicular pain 03/22/2016  . Hamstring tightness 12/11/2014  . 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 Pain in joint, upper arm 11/17/2014  . Labyrinthitis 05/15/2012  . Sinusitis acute 02/12/2012  . Well adult exam 06/27/2011  . Rash, skin 06/27/2011  . ADHESIVE CAPSULITIS, LEFT 11/09/2009  . SHOULDER PAIN 09/09/2009  . HAIR LOSS 07/24/2008  . ELEVATED BP 07/24/2008  . CARPAL TUNNEL SYNDROME 03/23/2008  . PARESTHESIA 03/23/2008  . MIGRAINE HEADACHE 08/02/2007   [name] PT, DPT, LAT, ATC  10/16/18  12:57 PM      [facility] [address] [place], [place], [address] Phone: [phone]   Fax:  [phone]  Name: Kelly Wall MRN: [id number] Date of Birth: [birth year]

## 2018-10-18 ENCOUNTER — Encounter

## 2018-10-21 ENCOUNTER — Encounter: Payer: Self-pay | Admitting: Physical Therapy

## 2018-10-21 ENCOUNTER — Ambulatory Visit: Payer: 59 | Admitting: Physical Therapy

## 2018-10-21 DIAGNOSIS — R29818 Other symptoms and signs involving the nervous system: Secondary | ICD-10-CM

## 2018-10-21 DIAGNOSIS — R29898 Other symptoms and signs involving the musculoskeletal system: Secondary | ICD-10-CM

## 2018-10-21 DIAGNOSIS — M4322 Fusion of spine, cervical region: Secondary | ICD-10-CM | POA: Diagnosis not present

## 2018-10-21 DIAGNOSIS — Z9889 Other specified postprocedural states: Secondary | ICD-10-CM | POA: Diagnosis not present

## 2018-10-21 DIAGNOSIS — K592 Neurogenic bowel, not elsewhere classified: Secondary | ICD-10-CM | POA: Diagnosis not present

## 2018-10-21 DIAGNOSIS — M79602 Pain in left arm: Secondary | ICD-10-CM | POA: Diagnosis not present

## 2018-10-21 DIAGNOSIS — G9589 Other specified diseases of spinal cord: Secondary | ICD-10-CM | POA: Diagnosis not present

## 2018-10-21 DIAGNOSIS — J45909 Unspecified asthma, uncomplicated: Secondary | ICD-10-CM | POA: Diagnosis not present

## 2018-10-21 DIAGNOSIS — I10 Essential (primary) hypertension: Secondary | ICD-10-CM | POA: Diagnosis not present

## 2018-10-21 DIAGNOSIS — M6281 Muscle weakness (generalized): Secondary | ICD-10-CM

## 2018-10-21 NOTE — Therapy (Signed)
[place], [place], [address] Phone: [phone]   Fax:  [phone]  Physical Therapy Treatment  Patient Details  Name: Kelly Wall MRN: [id number] Date of Birth: 1961/08/29 Referring Provider (PT): Dr. Alger Simons   Encounter Date: 10/21/2018  PT End of Session - 10/21/18 1439    Visit Number  29    Number of Visits  31    Date for PT Re-Evaluation  11/07/18    Authorization Type  UMR    PT Start Time  1334    PT Stop Time  1413    PT Time Calculation (min)  39 min    Activity Tolerance  Patient tolerated treatment well    Behavior During Therapy  [facility] for tasks assessed/performed       Past Medical History:  Diagnosis Date  . Asthma    child- occ exersie induced now  . Family history of adverse reaction to anesthesia    dad hard to awaken  . Hypertension    no rx  in 5 yrs fish oil helps now  . Medical history non-contributory    no anesthesia    Past Surgical History:  Procedure Laterality Date  . ANTERIOR CERVICAL DECOMP/DISCECTOMY FUSION N/A 03/30/2018   Procedure: ANTERIOR CERVICAL DECOMPRESSION/DISCECTOMY FUSION Cervical four-five and Cervical five-six;  Surgeon: Ashok Pall, MD;  Location: [facility];  Service: Neurosurgery;  Laterality: N/A;  . CERVICAL DISC ARTHROPLASTY N/A 04/20/2016   Procedure: Cervical Six-Seven Artificial Disc Replacement-Cervical;  Surgeon: Eustace Moore, MD;  Location: [facility];  Service: Neurosurgery;  Laterality: N/A;    There were no vitals filed for this visit.  Subjective Assessment - 10/21/18 1336    Subjective  " I still have some soreness in the knee which I do think it is getting better"     Currently in Pain?  Yes    Pain Score  4    Pain Location  Hip    Pain Orientation  Right    Pain Descriptors / Indicators  Sore    Pain Type  Chronic pain    Pain Frequency  Intermittent                       [facility] Adult PT  Treatment/Exercise - 10/21/18 0001      Self-Care   Other Self-Care Comments   MTPR along the hamstring using a tennis ball.      Exercises   Exercises  Knee/Hip;Lumbar      Lumbar Exercises: Stretches   Piriformis Stretch  2 reps;30 seconds      Lumbar Exercises: Seated   Other Seated Lumbar Exercises  seated on dynadisc 2 x 10 marching keeping pelvic anteriorly tilted       Lumbar Exercises: Supine   Bent Knee Raise  15 reps   with sustained posterior pelvic tilt     Knee/Hip Exercises: Stretches   Active Hamstring Stretch  2 reps;Right;30 seconds    Gastroc Stretch  2 reps;Both;30 seconds   slant board     Knee/Hip Exercises: Aerobic   Nustep  L5 x 5 min UE/LE      Knee/Hip Exercises: Standing   Gait Training  forward/ retro walking utilzing heel strike/ toe off with smaller steps in // x 6 ea.      Manual Therapy   Manual therapy comments  MTPR along semi-membranosus/ tendionosus  distal to proximal  PT Education - 10/21/18 1438    Education Details  proper gait pattern with heel strike and toe off to maximze efficiency taking smaller steps     Person(s) Educated  Patient    Methods  Explanation;Verbal cues;Handout    Comprehension  Verbalized understanding;Verbal cues required          PT Long Term Goals - 10/03/18 1230      PT LONG TERM GOAL #1   Title  Patient is independent with updated HEP (Target all LTGs 09/27/2018--updated to 10/04/18 due to delayed scheduling due to census)    Baseline   independent with current HEP; progressing as able.    Time  4    Period  Weeks    Status  On-going      PT LONG TERM GOAL #2   Title  Patient's right hip abduction strength will increase to 3+/5 and left hip abduction to 4+/5 with no left trendelenberg with walking.     Time  4    Period  Weeks    Status  On-going      PT LONG TERM GOAL #3   Title  Patient will maintain normal pelvic alignment (as indicated by level ASIS or iliac crests) and  report no left SI joint pain with activities.     Baseline  continues posteriorly rotated innominate onthe R with apparrant leg length discrepancy    Time  4    Period  Weeks    Status  On-going      PT LONG TERM GOAL #4   Title  Patient will walk 1000 ft on level outdoor surfaces modified independent without  rt genu recurvatum at least 90% of the time (with or without brace if needed)    Time  4    Period  Weeks    Status  Partially Met      PT LONG TERM GOAL #5   Title  Patient will ascend/descend 12 steps with step over step pattern with rail modified independent for access to her upstairs room.     Time  4    Period  Weeks    Status  Achieved            Plan - 10/21/18 1444    Clinical Impression Statement  Continued workiong on STW along hamstrings followed with stretching and hip / core strengthening. worked on Personnel officer to maximize efficency with cues and demonstration proper form. end of session she continues to report improvement of pain.     PT Next Visit Plan  core strengthening, R hip MET for posterior pelvic rotation, functional activities standing/ sitting,     Consulted and Agree with Plan of Care  Patient       Patient will benefit from skilled therapeutic intervention in order to improve the following deficits and impairments:  Abnormal gait, Decreased activity tolerance, Decreased balance, Decreased mobility, Decreased strength, Impaired sensation, Impaired UE functional use, Pain, Decreased knowledge of use of DME, Decreased scar mobility, Increased muscle spasms, Increased fascial restricitons  Visit Diagnosis: Muscle weakness (generalized)  Other symptoms and signs involving the nervous system  Other symptoms and signs involving the musculoskeletal system     Problem List Patient Active Problem List   Diagnosis Date Noted  . Cervical myelopathy ([place]) 04/02/2018  . Bilateral arm weakness   . Numbness and tingling in both hands   . Paresthesia  and pain of both upper extremities   . Trauma   . Post-operative pain   .  Uncomplicated asthma   . Benign essential HTN   . Bradycardia   . Steroid-induced hyperglycemia   . 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 of cervical spinal cord ([place]) 03/30/2018  . S/P cervical spinal fusion 04/20/2016  . Cervical radicular pain 03/22/2016  . Hamstring tightness 12/11/2014  . 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 Pain in joint, upper arm 11/17/2014  . Labyrinthitis 05/15/2012  . Sinusitis acute 02/12/2012  . Well adult exam 06/27/2011  . Rash, skin 06/27/2011  . ADHESIVE CAPSULITIS, LEFT 11/09/2009  . SHOULDER PAIN 09/09/2009  . HAIR LOSS 07/24/2008  . ELEVATED BP 07/24/2008  . CARPAL TUNNEL SYNDROME 03/23/2008  . PARESTHESIA 03/23/2008  . MIGRAINE HEADACHE 08/02/2007   [name] PT, DPT, LAT, ATC  10/21/18  2:59 PM      [facility] [address] [place], [place], [address] Phone: [phone]   Fax:  [phone]  Name: MARKEYA MINCY MRN: [id number] Date of Birth: February 22, 1961

## 2018-10-25 ENCOUNTER — Encounter

## 2018-10-29 ENCOUNTER — Ambulatory Visit: Payer: 59 | Admitting: Occupational Therapy

## 2018-10-31 ENCOUNTER — Ambulatory Visit: Payer: 59 | Attending: Physical Medicine & Rehabilitation | Admitting: Physical Therapy

## 2018-10-31 DIAGNOSIS — R29898 Other symptoms and signs involving the musculoskeletal system: Secondary | ICD-10-CM | POA: Diagnosis present

## 2018-10-31 DIAGNOSIS — R208 Other disturbances of skin sensation: Secondary | ICD-10-CM | POA: Insufficient documentation

## 2018-10-31 DIAGNOSIS — R2681 Unsteadiness on feet: Secondary | ICD-10-CM | POA: Insufficient documentation

## 2018-10-31 DIAGNOSIS — R29818 Other symptoms and signs involving the nervous system: Secondary | ICD-10-CM

## 2018-10-31 DIAGNOSIS — S14109A Unspecified injury at unspecified level of cervical spinal cord, initial encounter: Secondary | ICD-10-CM | POA: Diagnosis not present

## 2018-10-31 DIAGNOSIS — M6281 Muscle weakness (generalized): Secondary | ICD-10-CM | POA: Diagnosis not present

## 2018-10-31 NOTE — Therapy (Signed)
Kelly Wall, [place], [address] Phone: [phone]   Fax:  [phone]  Physical Therapy Treatment  Patient Details  Name: Kelly Wall MRN: [id number] Date of Birth: 08-May-1961 Referring Provider (PT): Dr. Alger Wall   Encounter Date: 10/31/2018  PT End of Session - 10/31/18 1233    Visit Number  30    Number of Visits  31    Date for PT Re-Evaluation  11/07/18    Authorization Type  UMR    PT Start Time  1200   pt arrived early but didn't start session until 12 due to using the bathroom   PT Stop Time  1230    PT Time Calculation (min)  30 min    Activity Tolerance  Patient tolerated treatment well    Behavior During Therapy  [facility] for tasks assessed/performed       Past Medical History:  Diagnosis Date  . Asthma    child- occ exersie induced now  . Family history of adverse reaction to anesthesia    dad hard to awaken  . Hypertension    no rx  in 5 yrs fish oil helps now  . Medical history non-contributory    no anesthesia    Past Surgical History:  Procedure Laterality Date  . ANTERIOR CERVICAL DECOMP/DISCECTOMY FUSION N/A 03/30/2018   Procedure: ANTERIOR CERVICAL DECOMPRESSION/DISCECTOMY FUSION Cervical four-five and Cervical five-six;  Surgeon: Kelly Pall, MD;  Location: [facility];  Service: Neurosurgery;  Laterality: N/A;  . CERVICAL DISC ARTHROPLASTY N/A 04/20/2016   Procedure: Cervical Six-Seven Artificial Disc Replacement-Cervical;  Surgeon: Kelly Moore, MD;  Location: [facility];  Service: Neurosurgery;  Laterality: N/A;    There were no vitals filed for this visit.                    [facility] Adult PT Treatment/Exercise - 10/31/18 0001      Exercises   Exercises  Knee/Hip;Lumbar      Lumbar Exercises: Stretches   Other Lumbar Stretch Exercise  gastroc/ soleus slant board stretch 2 x 30 ea.      Lumbar Exercises: Aerobic   Nustep  L5 x 1 min, L6 x 1 min,  L7 x 3 min   LE only     Lumbar Exercises: Machines for Strengthening   Cybex Knee Extension  2 x 15 20# bil LE   focus on exccentric loading   Leg Press  2 x 15 avoiding locking the knees and for slow controlled movement   1 set 20#, 1 set 35#         Balance Exercises - 10/31/18 1234      Balance Exercises: Standing   Standing Eyes Closed  4 reps;Narrow base of support (BOS);30 secs    Tandem Stance  Eyes closed;4 reps;30 secs        PT Education - 10/31/18 1232    Education Details  updated HEP for corner balance    Person(s) Educated  Patient    Methods  Explanation;Verbal cues;Handout    Comprehension  Verbalized understanding;Verbal cues required          PT Long Term Goals - 10/03/18 1230      PT LONG TERM GOAL #1   Title  Patient is independent with updated HEP (Target all LTGs 09/27/2018--updated to 10/04/18 due to delayed scheduling due to census)    Baseline   independent with current HEP; progressing as able.  Time  4    Period  Weeks    Status  On-going      PT LONG TERM GOAL #2   Title  Patient's right hip abduction strength will increase to 3+/5 and left hip abduction to 4+/5 with no left trendelenberg with walking.     Time  4    Period  Weeks    Status  On-going      PT LONG TERM GOAL #3   Title  Patient will maintain normal pelvic alignment (as indicated by level ASIS or iliac crests) and report no left SI joint pain with activities.     Baseline  continues posteriorly rotated innominate onthe R with apparrant leg length discrepancy    Time  4    Period  Weeks    Status  On-going      PT LONG TERM GOAL #4   Title  Patient will walk 1000 ft on level outdoor surfaces modified independent without  rt genu recurvatum at least 90% of the time (with or without brace if needed)    Time  4    Period  Weeks    Status  Partially Met      PT LONG TERM GOAL #5   Title  Patient will ascend/descend 12 steps with step over step pattern with rail  modified independent for access to her upstairs room.     Time  4    Period  Weeks    Status  Achieved            Plan - 10/31/18 1238    Clinical Impression Statement  pt arrived 15 min late after checking in due to using the restroom. cotninued working on strengthening and progressed balance in the corner to promote stability. end of session she reported no increase in pain.     PT Next Visit Plan  core strengthening, R hip MET for posterior pelvic rotation, functional activities standing/ sitting,     PT Home Exercise Plan  balance training    Consulted and Agree with Plan of Care  Patient       Patient will benefit from skilled therapeutic intervention in order to improve the following deficits and impairments:     Visit Diagnosis: Muscle weakness (generalized)  Other symptoms and signs involving the nervous system  Other symptoms and signs involving the musculoskeletal system  Unsteadiness on feet     Problem List Patient Active Problem List   Diagnosis Date Noted  . Cervical myelopathy ([place]) 04/02/2018  . Bilateral arm weakness   . Numbness and tingling in both hands   . Paresthesia and pain of both upper extremities   . Trauma   . Post-operative pain   . Uncomplicated asthma   . Benign essential HTN   . Bradycardia   . Steroid-induced hyperglycemia   . 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 of cervical spinal cord ([place]) 03/30/2018  . S/P cervical spinal fusion 04/20/2016  . Cervical radicular pain 03/22/2016  . Hamstring tightness 12/11/2014  . 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 Pain in joint, upper arm 11/17/2014  . Labyrinthitis 05/15/2012  . Sinusitis acute 02/12/2012  . Well adult exam 06/27/2011  . Rash, skin 06/27/2011  . ADHESIVE CAPSULITIS, LEFT 11/09/2009  . SHOULDER PAIN 09/09/2009  . HAIR LOSS 07/24/2008  . ELEVATED BP 07/24/2008  . CARPAL TUNNEL SYNDROME 03/23/2008  . PARESTHESIA 03/23/2008  . MIGRAINE HEADACHE 08/02/2007   Kelly Wall PT,  DPT, LAT, ATC  10/31/18  12:40  PM      [place], [place], [address] Phone: [phone]   Fax:  [phone]  Name: Kelly Wall MRN: [id number] Date of Birth: 10-Jan-1961

## 2018-11-05 ENCOUNTER — Ambulatory Visit: Payer: 59 | Admitting: Occupational Therapy

## 2018-11-05 ENCOUNTER — Ambulatory Visit: Payer: 59 | Admitting: Physical Therapy

## 2018-11-05 ENCOUNTER — Encounter: Payer: Self-pay | Admitting: Physical Therapy

## 2018-11-05 DIAGNOSIS — R208 Other disturbances of skin sensation: Secondary | ICD-10-CM | POA: Diagnosis not present

## 2018-11-05 DIAGNOSIS — R29818 Other symptoms and signs involving the nervous system: Secondary | ICD-10-CM

## 2018-11-05 DIAGNOSIS — M6281 Muscle weakness (generalized): Secondary | ICD-10-CM | POA: Diagnosis not present

## 2018-11-05 DIAGNOSIS — R2681 Unsteadiness on feet: Secondary | ICD-10-CM | POA: Diagnosis not present

## 2018-11-05 DIAGNOSIS — R29898 Other symptoms and signs involving the musculoskeletal system: Secondary | ICD-10-CM | POA: Diagnosis not present

## 2018-11-05 DIAGNOSIS — S14109A Unspecified injury at unspecified level of cervical spinal cord, initial encounter: Secondary | ICD-10-CM | POA: Diagnosis not present

## 2018-11-05 NOTE — Therapy (Signed)
[place], [place], [address] Phone: [phone]   Fax:  [phone]  Physical Therapy Treatment  Patient Details  Name: Kelly Wall MRN: [id number] Date of Birth: 01-07-1961 Referring Provider (PT): Dr. Alger Simons   Encounter Date: 11/05/2018  PT End of Session - 11/05/18 1204    Visit Number  31    Number of Visits  31    Date for PT Re-Evaluation  11/07/18    Authorization Type  UMR    PT Start Time  1203   pt was 18 min late   PT Stop Time  1228    PT Time Calculation (min)  25 min    Activity Tolerance  Patient tolerated treatment well    Behavior During Therapy  [facility] for tasks assessed/performed       Past Medical History:  Diagnosis Date  . Asthma    child- occ exersie induced now  . Family history of adverse reaction to anesthesia    dad hard to awaken  . Hypertension    no rx  in 5 yrs fish oil helps now  . Medical history non-contributory    no anesthesia    Past Surgical History:  Procedure Laterality Date  . ANTERIOR CERVICAL DECOMP/DISCECTOMY FUSION N/A 03/30/2018   Procedure: ANTERIOR CERVICAL DECOMPRESSION/DISCECTOMY FUSION Cervical four-five and Cervical five-six;  Surgeon: Ashok Pall, MD;  Location: [facility];  Service: Neurosurgery;  Laterality: N/A;  . CERVICAL DISC ARTHROPLASTY N/A 04/20/2016   Procedure: Cervical Six-Seven Artificial Disc Replacement-Cervical;  Surgeon: Eustace Moore, MD;  Location: [facility];  Service: Neurosurgery;  Laterality: N/A;    There were no vitals filed for this visit.                    [facility] Adult PT Treatment/Exercise - 11/05/18 0001      Exercises   Exercises  Knee/Hip;Lumbar    Other Exercises   revisited if patient is interested in pool therapy and she states she is and never received the messages I left for her; discussed safety issues when hiking (see subjective) and possible use of hiking poles; always  hiking with a friend       Knee/Hip Exercises: Stretches   Hip Flexor Stretch  2 reps;30 seconds      Knee/Hip Exercises: Aerobic   Nustep  L7 x 4 min LE only      Knee/Hip Exercises: Standing   Step Down  2 sets;10 reps;Step Height: 4"    Other Standing Knee Exercises  wall squat 2 x 10                  PT Long Term Goals - 10/03/18 1230      PT LONG TERM GOAL #1   Title  Patient is independent with updated HEP (Target all LTGs 09/27/2018--updated to 10/04/18 due to delayed scheduling due to census)    Baseline   independent with current HEP; progressing as able.    Time  4    Period  Weeks    Status  On-going      PT LONG TERM GOAL #2   Title  Patient's right hip abduction strength will increase to 3+/5 and left hip abduction to 4+/5 with no left trendelenberg with walking.     Time  4    Period  Weeks    Status  On-going      PT LONG TERM GOAL #3  Title  Patient will maintain normal pelvic alignment (as indicated by level ASIS or iliac crests) and report no left SI joint pain with activities.     Baseline  continues posteriorly rotated innominate onthe R with apparrant leg length discrepancy    Time  4    Period  Weeks    Status  On-going      PT LONG TERM GOAL #4   Title  Patient will walk 1000 ft on level outdoor surfaces modified independent without  rt genu recurvatum at least 90% of the time (with or without brace if needed)    Time  4    Period  Weeks    Status  Partially Met      PT LONG TERM GOAL #5   Title  Patient will ascend/descend 12 steps with step over step pattern with rail modified independent for access to her upstairs room.     Time  4    Period  Weeks    Status  Achieved            Plan - 11/05/18 1230    Clinical Impression Statement  pt was 18 min late today. focused on strengthening utilizing body weight with step-ups and wall squat which she did well with. She reports continued improvement of function noting decreased  incidence of the knee buckling. plan to reassess function/ strength next visit.     PT Treatment/Interventions  ADLs/Self Care Home Management;Aquatic Therapy;Gait training;Electrical Stimulation;DME Instruction;Stair training;Functional mobility training;Therapeutic activities;Therapeutic exercise;Balance training;Neuromuscular re-education;Manual techniques;Patient/family education;Passive range of motion;Biofeedback;Moist Heat;Traction;Taping;Dry needling;Scar mobilization;Compression bandaging    PT Next Visit Plan  ERO, core strengthening, R hip MET for posterior pelvic rotation, functional activities standing/ sitting,     PT Home Exercise Plan  balance training    Consulted and Agree with Plan of Care  Patient       Patient will benefit from skilled therapeutic intervention in order to improve the following deficits and impairments:  Abnormal gait, Decreased activity tolerance, Decreased balance, Decreased mobility, Decreased strength, Impaired sensation, Impaired UE functional use, Pain, Decreased knowledge of use of DME, Decreased scar mobility, Increased muscle spasms, Increased fascial restricitons  Visit Diagnosis: Muscle weakness (generalized)  Other symptoms and signs involving the nervous system  Other symptoms and signs involving the musculoskeletal system     Problem List Patient Active Problem List   Diagnosis Date Noted  . Cervical myelopathy ([place]) 04/02/2018  . Bilateral arm weakness   . Numbness and tingling in both hands   . Paresthesia and pain of both upper extremities   . Trauma   . Post-operative pain   . Uncomplicated asthma   . Benign essential HTN   . Bradycardia   . Steroid-induced hyperglycemia   . 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 of cervical spinal cord ([place]) 03/30/2018  . S/P cervical spinal fusion 04/20/2016  . Cervical radicular pain 03/22/2016  . Hamstring tightness 12/11/2014  . 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 Pain in joint, upper arm 11/17/2014   . Labyrinthitis 05/15/2012  . Sinusitis acute 02/12/2012  . Well adult exam 06/27/2011  . Rash, skin 06/27/2011  . ADHESIVE CAPSULITIS, LEFT 11/09/2009  . SHOULDER PAIN 09/09/2009  . HAIR LOSS 07/24/2008  . ELEVATED BP 07/24/2008  . CARPAL TUNNEL SYNDROME 03/23/2008  . PARESTHESIA 03/23/2008  . MIGRAINE HEADACHE 08/02/2007   [name] PT, DPT, LAT, ATC  11/05/18  12:36 PM      [place]  [place], [place], [address] Phone: [phone]   Fax:  [phone]  Name: Kelly Wall MRN: [id number] Date of Birth: [birth year]

## 2018-11-07 ENCOUNTER — Telehealth: Payer: Self-pay | Admitting: Physical Medicine & Rehabilitation

## 2018-11-07 DIAGNOSIS — S14125S 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 of cervical spinal cord, sequela: Secondary | ICD-10-CM

## 2018-11-07 DIAGNOSIS — R202 Paresthesia of skin: Principal | ICD-10-CM

## 2018-11-07 DIAGNOSIS — M79602 Pain in left arm: Principal | ICD-10-CM

## 2018-11-07 DIAGNOSIS — M79601 Pain in right arm: Secondary | ICD-10-CM

## 2018-11-07 MED ORDER — PREGABALIN 150 MG PO CAPS: ORAL_CAPSULE | ORAL | 2 refills | Status: DC

## 2018-11-07 MED FILL — CLINDAMYCIN PHOS-BENZOYL PE: 1-5 | 30 days supply | Qty: 25 | Fill #4

## 2018-11-07 MED FILL — LORYNA 3-0.02 MG TABS: 3-0.02 | 84 days supply | Qty: 84 | Fill #1

## 2018-11-07 MED FILL — PREGABALIN 150 MG CAPS: 150 | 30 days supply | Qty: 60 | Fill #0

## 2018-11-07 MED FILL — TRETINOIN 0.025% CREAM: 0.025 | 90 days supply | Qty: 45 | Fill #2

## 2018-11-07 NOTE — Telephone Encounter (Signed)
PMP was Reviewed: Last refill 10/10/2018. Dr. Riley KillSwartz note was reviewed.  Lyrica prescription was e-scribed.  Placed a call to Ms. Cowans,  No answer. left message.

## 2018-11-07 NOTE — Telephone Encounter (Signed)
Need a provider to e-scribe this medication to outpatient pharmacy [facility]

## 2018-11-12 ENCOUNTER — Encounter: Payer: Self-pay | Admitting: Physical Therapy

## 2018-11-12 ENCOUNTER — Encounter: Payer: Self-pay | Admitting: Occupational Therapy

## 2018-11-12 ENCOUNTER — Ambulatory Visit: Payer: 59 | Admitting: Physical Therapy

## 2018-11-12 ENCOUNTER — Ambulatory Visit: Payer: 59 | Admitting: Occupational Therapy

## 2018-11-12 DIAGNOSIS — M6281 Muscle weakness (generalized): Secondary | ICD-10-CM

## 2018-11-12 DIAGNOSIS — R29818 Other symptoms and signs involving the nervous system: Secondary | ICD-10-CM

## 2018-11-12 DIAGNOSIS — R29898 Other symptoms and signs involving the musculoskeletal system: Secondary | ICD-10-CM

## 2018-11-12 DIAGNOSIS — R208 Other disturbances of skin sensation: Secondary | ICD-10-CM

## 2018-11-12 DIAGNOSIS — R2681 Unsteadiness on feet: Secondary | ICD-10-CM

## 2018-11-12 DIAGNOSIS — S14109A Unspecified injury at unspecified level of cervical spinal cord, initial encounter: Secondary | ICD-10-CM | POA: Diagnosis not present

## 2018-11-12 NOTE — Therapy (Signed)
[place], [place], [address] Phone: [phone]   Fax:  [phone]  Physical Therapy Treatment / Re-certification  Patient Details  Name: Kelly Wall MRN: [id number] Date of Birth: 06-Mar-1961 Referring Provider (PT): Dr. Alger Simons   Encounter Date: 11/12/2018  PT End of Session - 11/12/18 1152    Visit Number  32    Number of Visits  38    Date for PT Re-Evaluation  12/24/18    Authorization Type  UMR    PT Start Time  1150    PT Stop Time  1238    PT Time Calculation (min)  48 min    Activity Tolerance  Patient tolerated treatment well    Behavior During Therapy  [facility] for tasks assessed/performed       Past Medical History:  Diagnosis Date  . Asthma    child- occ exersie induced now  . Family history of adverse reaction to anesthesia    dad hard to awaken  . Hypertension    no rx  in 5 yrs fish oil helps now  . Medical history non-contributory    no anesthesia    Past Surgical History:  Procedure Laterality Date  . ANTERIOR CERVICAL DECOMP/DISCECTOMY FUSION N/A 03/30/2018   Procedure: ANTERIOR CERVICAL DECOMPRESSION/DISCECTOMY FUSION Cervical four-five and Cervical five-six;  Surgeon: Ashok Pall, MD;  Location: [place];  Service: Neurosurgery;  Laterality: N/A;  . CERVICAL DISC ARTHROPLASTY N/A 04/20/2016   Procedure: Cervical Six-Seven Artificial Disc Replacement-Cervical;  Surgeon: Eustace Moore, MD;  Location: [place] [facility];  Service: Neurosurgery;  Laterality: N/A;    There were no vitals filed for this visit.  Subjective Assessment - 11/12/18 1154    Currently in Pain?  Yes    Pain Score  6    Pain Location  Leg    Pain Orientation  Right    Pain Descriptors / Indicators  Sore;Aching    Pain Type  Chronic pain    Pain Onset  More than a month ago    Pain Frequency  Intermittent    Aggravating Factors   walking/ standing         [facility] PT Assessment - 11/12/18 0001       Strength   Right Hip Flexion  4-/5    Right Hip Extension  4-/5    Right Hip ABduction  3+/5    Right Hip ADduction  4-/5    Left Hip Flexion  4/5    Left Hip ABduction  4/5    Right Knee Flexion  5/5    Right Knee Extension  4+/5    Left Knee Flexion  4+/5    Left Knee Extension  4+/5                   [facility] Adult PT Treatment/Exercise - 11/12/18 0001      Knee/Hip Exercises: Stretches   Active Hamstring Stretch  2 reps;30 seconds;Right   pNF contract/relax     Knee/Hip Exercises: Aerobic   Nustep  L6 x 6 min   LE only     Knee/Hip Exercises: Machines for Strengthening   Hip Cybex  2 x 12 12.5# flexion, 2 x 12 12.5 #      Knee/Hip Exercises: Seated   Other Seated Knee/Hip Exercises  hip ER 2 x 12 with yellow theraband      Knee/Hip Exercises: Supine   Other Supine Knee/Hip Exercises  Clams 2 x 15  with green theradband, cues for breathing technique     Knee/Hip Exercises: Sidelying   Hip ADduction  2 sets;Strengthening;Right   in R sidelying     Manual Therapy   Joint Mobilization  LAD grade 5 on RLE                  PT Long Term Goals - 11/12/18 1212      PT LONG TERM GOAL #1   Title  Patient is independent with updated HEP    Baseline   independent with current HEP; progressing as able.    Time  4    Period  Weeks    Status  On-going    Target Date  12/24/18      PT LONG TERM GOAL #2   Title  Patient's right hip abduction strength will increase to 3+/5 and left hip abduction to 4+/5 with no left trendelenberg with walking.     Period  Weeks    Status  Partially Met      PT LONG TERM GOAL #3   Title  Patient will maintain normal pelvic alignment (as indicated by level ASIS or iliac crests) and report no left SI joint pain with activities.     Time  4    Period  Weeks    Status  On-going      PT LONG TERM GOAL #4   Title  Patient will walk 1000 ft on level outdoor surfaces modified independent without  rt genu recurvatum  at least 90% of the time (with or without brace if needed)    Status  Partially Met      PT LONG TERM GOAL #5   Title  Patient will ascend/descend 12 steps with step over step pattern with rail modified independent for access to her upstairs room.     Period  Weeks    Status  Achieved            Plan - 11/12/18 1241    Clinical Impression Statement  Kelly Wall reports increased soreness at 7/10 today but continues to show steady progression of strength along bil LE but continues to fatigue quickly. She is progressing toward her long term goals. focused on strengthening to promote hip stability which following session she noted decreased pain at 2-3/10. plan to conitnue with current POC to address strength/ endurnace, balance and maximizing her safety.     PT Frequency  1x / week    PT Duration  6 weeks    PT Treatment/Interventions  ADLs/Self Care Home Management;Aquatic Therapy;Gait training;Electrical Stimulation;DME Instruction;Stair training;Functional mobility training;Therapeutic activities;Therapeutic exercise;Balance training;Neuromuscular re-education;Manual techniques;Patient/family education;Passive range of motion;Biofeedback;Moist Heat;Traction;Taping;Dry needling;Scar mobilization;Compression bandaging    PT Next Visit Plan  , core strengthening, R hip MET for posterior pelvic rotation, functional activities standing/ sitting,     PT Home Exercise Plan  balance training    Consulted and Agree with Plan of Care  Patient       Patient will benefit from skilled therapeutic intervention in order to improve the following deficits and impairments:  Abnormal gait, Decreased activity tolerance, Decreased balance, Decreased mobility, Decreased strength, Impaired sensation, Impaired UE functional use, Pain, Decreased knowledge of use of DME, Decreased scar mobility, Increased muscle spasms, Increased fascial restricitons  Visit Diagnosis: Muscle weakness (generalized)  Other symptoms and  signs involving the nervous system  Other symptoms and signs involving the musculoskeletal system     Problem List Patient Active Problem List   Diagnosis Date Noted  .  Cervical myelopathy ([place]) 04/02/2018  . Bilateral arm weakness   . Numbness and tingling in both hands   . Paresthesia and pain of both upper extremities   . Trauma   . Post-operative pain   . Uncomplicated asthma   . Benign essential HTN   . Bradycardia   . Steroid-induced hyperglycemia   . 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 of cervical spinal cord ([place]) 03/30/2018  . S/P cervical spinal fusion 04/20/2016  . Cervical radicular pain 03/22/2016  . Hamstring tightness 12/11/2014  . 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 Pain in joint, upper arm 11/17/2014  . Labyrinthitis 05/15/2012  . Sinusitis acute 02/12/2012  . Well adult exam 06/27/2011  . Rash, skin 06/27/2011  . ADHESIVE CAPSULITIS, LEFT 11/09/2009  . SHOULDER PAIN 09/09/2009  . HAIR LOSS 07/24/2008  . ELEVATED BP 07/24/2008  . CARPAL TUNNEL SYNDROME 03/23/2008  . PARESTHESIA 03/23/2008  . MIGRAINE HEADACHE 08/02/2007   [name] PT, DPT, LAT, ATC  11/12/18  12:46 PM      [facility] [address] [place], [place], [address] Phone: [phone]   Fax:  [phone]  Name: Kelly Wall MRN: [id number] Date of Birth: [birth year]
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
## 2018-11-18 ENCOUNTER — Ambulatory Visit: Payer: 59 | Admitting: Physical Therapy

## 2018-11-18 ENCOUNTER — Encounter: Payer: Self-pay | Admitting: Physical Therapy

## 2018-11-18 DIAGNOSIS — S14109A Unspecified injury at unspecified level of cervical spinal cord, initial encounter: Secondary | ICD-10-CM | POA: Diagnosis not present

## 2018-11-18 DIAGNOSIS — R29818 Other symptoms and signs involving the nervous system: Secondary | ICD-10-CM | POA: Diagnosis not present

## 2018-11-18 DIAGNOSIS — R2681 Unsteadiness on feet: Secondary | ICD-10-CM

## 2018-11-18 DIAGNOSIS — M6281 Muscle weakness (generalized): Secondary | ICD-10-CM

## 2018-11-18 DIAGNOSIS — R29898 Other symptoms and signs involving the musculoskeletal system: Secondary | ICD-10-CM | POA: Diagnosis not present

## 2018-11-18 DIAGNOSIS — R208 Other disturbances of skin sensation: Secondary | ICD-10-CM | POA: Diagnosis not present

## 2018-11-18 NOTE — Therapy (Signed)
[place], [place], [address] Phone: [phone]   Fax:  [phone]  Physical Therapy Treatment  Patient Details  Name: Kelly Wall MRN: [id number] Date of Birth: 1961/04/14 Referring Provider (PT): Dr. Alger Simons   Encounter Date: 11/18/2018  PT End of Session - 11/18/18 1701    Visit Number  33    Number of Visits  38    Date for PT Re-Evaluation  12/24/18    Authorization Type  UMR    PT Start Time  1552    PT Stop Time  1631    PT Time Calculation (min)  39 min    Activity Tolerance  Patient tolerated treatment well    Behavior During Therapy  [facility] for tasks assessed/performed       Past Medical History:  Diagnosis Date  . Asthma    child- occ exersie induced now  . Family history of adverse reaction to anesthesia    dad hard to awaken  . Hypertension    no rx  in 5 yrs fish oil helps now  . Medical history non-contributory    no anesthesia    Past Surgical History:  Procedure Laterality Date  . ANTERIOR CERVICAL DECOMP/DISCECTOMY FUSION N/A 03/30/2018   Procedure: ANTERIOR CERVICAL DECOMPRESSION/DISCECTOMY FUSION Cervical four-five and Cervical five-six;  Surgeon: Ashok Pall, MD;  Location: [facility];  Service: Neurosurgery;  Laterality: N/A;  . CERVICAL DISC ARTHROPLASTY N/A 04/20/2016   Procedure: Cervical Six-Seven Artificial Disc Replacement-Cervical;  Surgeon: Eustace Moore, MD;  Location: [place] [facility];  Service: Neurosurgery;  Laterality: N/A;    There were no vitals filed for this visit.  Subjective Assessment - 11/18/18 1553    Subjective  " I am still getting pressure in the R low back, its not really bad"     Currently in Pain?  Yes    Pain Score  4     Pain Orientation  Right;Left    Pain Descriptors / Indicators  Aching                       [facility] Adult PT Treatment/Exercise - 11/18/18 1605      Self-Care   Other Self-Care Comments   how to  perform MTPR along the R lumbar paraspinals.       Lumbar Exercises: Stretches   Quadruped Mid Back Stretch  2 reps;30 seconds   childs pose     Lumbar Exercises: Quadruped   [place]  10 reps   holding 5 secs     Knee/Hip Exercises: Standing   Other Standing Knee Exercises  Marching on airex pad 2 x 10 with bil HHA for safety      Knee/Hip Exercises: Prone   Other Prone Exercises  hip ER 2 x 10 on the LLE with red theraband      Manual Therapy   Manual therapy comments  MTPR along adductor longus, R lumbar paraspinals    Joint Mobilization  LAD grade 5 on RLE, L inferior sacral angle grade 3 PA    Soft tissue mobilization  DTM along the R paraspinal using tennis ball                  PT Long Term Goals - 11/12/18 1212      PT LONG TERM GOAL #1   Title  Patient is independent with updated HEP    Baseline   independent with current HEP; progressing  as able.    Time  4    Period  Weeks    Status  On-going    Target Date  12/24/18      PT LONG TERM GOAL #2   Title  Patient's right hip abduction strength will increase to 3+/5 and left hip abduction to 4+/5 with no left trendelenberg with walking.     Period  Weeks    Status  Partially Met      PT LONG TERM GOAL #3   Title  Patient will maintain normal pelvic alignment (as indicated by level ASIS or iliac crests) and report no left SI joint pain with activities.     Time  4    Period  Weeks    Status  On-going      PT LONG TERM GOAL #4   Title  Patient will walk 1000 ft on level outdoor surfaces modified independent without  rt genu recurvatum at least 90% of the time (with or without brace if needed)    Status  Partially Met      PT LONG TERM GOAL #5   Title  Patient will ascend/descend 12 steps with step over step pattern with rail modified independent for access to her upstairs room.     Period  Weeks    Status  Achieved            Plan - 11/18/18 1701    Clinical Impression Statement   continued working on SIJ involvement on the R with LAD and hip / core strengthening focusing on hip flexors. pt demonstrate potential L on L sacral torsion, whcih following mobs and L piriformis strengthening she noted improvement.     PT Treatment/Interventions  ADLs/Self Care Home Management;Aquatic Therapy;Gait training;Electrical Stimulation;DME Instruction;Stair training;Functional mobility training;Therapeutic activities;Therapeutic exercise;Balance training;Neuromuscular re-education;Manual techniques;Patient/family education;Passive range of motion;Biofeedback;Moist Heat;Traction;Taping;Dry needling;Scar mobilization;Compression bandaging    PT Next Visit Plan  , core strengthening, R hip MET for posterior pelvic rotation, functional activities standing/ sitting,     PT Home Exercise Plan  balance training    Consulted and Agree with Plan of Care  Patient       Patient will benefit from skilled therapeutic intervention in order to improve the following deficits and impairments:  Abnormal gait, Decreased activity tolerance, Decreased balance, Decreased mobility, Decreased strength, Impaired sensation, Impaired UE functional use, Pain, Decreased knowledge of use of DME, Decreased scar mobility, Increased muscle spasms, Increased fascial restricitons  Visit Diagnosis: Muscle weakness (generalized)  Other symptoms and signs involving the nervous system  Other symptoms and signs involving the musculoskeletal system  Unsteadiness on feet     Problem List Patient Active Problem List   Diagnosis Date Noted  . Cervical myelopathy ([place]) 04/02/2018  . Bilateral arm weakness   . Numbness and tingling in both hands   . Paresthesia and pain of both upper extremities   . Trauma   . Post-operative pain   . Uncomplicated asthma   . Benign essential HTN   . Bradycardia   . Steroid-induced hyperglycemia   . 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 of cervical spinal cord ([place]) 03/30/2018  . S/P  cervical spinal fusion 04/20/2016  . Cervical radicular pain 03/22/2016  . Hamstring tightness 12/11/2014  . 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 Pain in joint, upper arm 11/17/2014  . Labyrinthitis 05/15/2012  . Sinusitis acute 02/12/2012  . Well adult exam 06/27/2011  . Rash, skin 06/27/2011  . ADHESIVE CAPSULITIS, LEFT 11/09/2009  . SHOULDER PAIN 09/09/2009  .  HAIR LOSS 07/24/2008  . ELEVATED BP 07/24/2008  . CARPAL TUNNEL SYNDROME 03/23/2008  . PARESTHESIA 03/23/2008  . MIGRAINE HEADACHE 08/02/2007   [name] PT, DPT, LAT, ATC  11/18/18  5:33 PM      [facility] [address] [place], [place], [address] Phone: [phone]   Fax:  [phone]  Name: Kelly Wall MRN: [id number] Date of Birth: July 27, 1961

## 2018-11-19 ENCOUNTER — Ambulatory Visit: Payer: 59 | Admitting: Physical Therapy

## 2018-11-25 ENCOUNTER — Ambulatory Visit: Payer: 59 | Admitting: Physical Therapy

## 2018-11-25 DIAGNOSIS — R208 Other disturbances of skin sensation: Secondary | ICD-10-CM | POA: Diagnosis not present

## 2018-11-25 DIAGNOSIS — M6281 Muscle weakness (generalized): Secondary | ICD-10-CM

## 2018-11-25 DIAGNOSIS — R2681 Unsteadiness on feet: Secondary | ICD-10-CM

## 2018-11-25 DIAGNOSIS — R29898 Other symptoms and signs involving the musculoskeletal system: Secondary | ICD-10-CM

## 2018-11-25 DIAGNOSIS — R29818 Other symptoms and signs involving the nervous system: Secondary | ICD-10-CM | POA: Diagnosis not present

## 2018-11-25 DIAGNOSIS — S14109A Unspecified injury at unspecified level of cervical spinal cord, initial encounter: Secondary | ICD-10-CM | POA: Diagnosis not present

## 2018-11-25 NOTE — Therapy (Signed)
[place], [place], [address] Phone: [phone]   Fax:  [phone]  Physical Therapy Treatment  Patient Details  Name: Kelly Wall MRN: [id number] Date of Birth: Aug 05, 1961 Referring Provider (PT): Dr. Alger Simons   Encounter Date: 11/25/2018  PT End of Session - 11/25/18 1012    Visit Number  34    Number of Visits  38    Date for PT Re-Evaluation  12/24/18    Authorization Type  UMR    PT Start Time  [phone]   pt arrived 9 min    PT Stop Time  1012    PT Time Calculation (min)  33 min    Activity Tolerance  Patient tolerated treatment well    Behavior During Therapy  [facility] for tasks assessed/performed       Past Medical History:  Diagnosis Date  . Asthma    child- occ exersie induced now  . Family history of adverse reaction to anesthesia    dad hard to awaken  . Hypertension    no rx  in 5 yrs fish oil helps now  . Medical history non-contributory    no anesthesia    Past Surgical History:  Procedure Laterality Date  . ANTERIOR CERVICAL DECOMP/DISCECTOMY FUSION N/A 03/30/2018   Procedure: ANTERIOR CERVICAL DECOMPRESSION/DISCECTOMY FUSION Cervical four-five and Cervical five-six;  Surgeon: Ashok Pall, MD;  Location: [place];  Service: Neurosurgery;  Laterality: N/A;  . CERVICAL DISC ARTHROPLASTY N/A 04/20/2016   Procedure: Cervical Six-Seven Artificial Disc Replacement-Cervical;  Surgeon: Eustace Moore, MD;  Location: [place] [facility];  Service: Neurosurgery;  Laterality: N/A;    There were no vitals filed for this visit.  Subjective Assessment - 11/25/18 0941    Subjective  " I did a virtual cycling class for 45 min and I am sore from it,     Currently in Pain?  Yes    Pain Score  4     Pain Orientation  Right;Left    Pain Onset  More than a month ago    Pain Frequency  Intermittent    Aggravating Factors   cold weather, doing too much    Pain Relieving Factors  meds                        [facility] Adult PT Treatment/Exercise - 11/25/18 0947      Knee/Hip Exercises: Stretches   Active Hamstring Stretch  2 reps;30 seconds;Right    Gastroc Stretch  2 reps;Both;30 seconds      Knee/Hip Exercises: Aerobic   Nustep  L7 x 5 min    Stepper  L1 x 2 min    6 floors     Knee/Hip Exercises: Machines for Strengthening   Hip Cybex  2 x 12 12.5# flexion, 2 x 12 12.5 #      Knee/Hip Exercises: Standing   Forward Step Up  1 set;10 reps;Other (comment)   onto BOSU     Knee/Hip Exercises: Supine   Straight Leg Raises  2 sets;10 reps;Right      Manual Therapy   Joint Mobilization  LAD grade 5 on                   PT Long Term Goals - 11/12/18 1212      PT LONG TERM GOAL #1   Title  Patient is independent with updated HEP    Baseline   independent with  current HEP; progressing as able.    Time  4    Period  Weeks    Status  On-going    Target Date  12/24/18      PT LONG TERM GOAL #2   Title  Patient's right hip abduction strength will increase to 3+/5 and left hip abduction to 4+/5 with no left trendelenberg with walking.     Period  Weeks    Status  Partially Met      PT LONG TERM GOAL #3   Title  Patient will maintain normal pelvic alignment (as indicated by level ASIS or iliac crests) and report no left SI joint pain with activities.     Time  4    Period  Weeks    Status  On-going      PT LONG TERM GOAL #4   Title  Patient will walk 1000 ft on level outdoor surfaces modified independent without  rt genu recurvatum at least 90% of the time (with or without brace if needed)    Status  Partially Met      PT LONG TERM GOAL #5   Title  Patient will ascend/descend 12 steps with step over step pattern with rail modified independent for access to her upstairs room.     Period  Weeks    Status  Achieved            Plan - 11/25/18 1000    Clinical Impression Statement  continue to progress with strengtheing of the hips/  core. she continues to fatigue with hip flexion strengthening activities. workingon balance with progression to step up on to bosu which she noted was difficult. discussed avoiding doing too much at the gym, more doesn't always mean better.    PT Treatment/Interventions  ADLs/Self Care Home Management;Aquatic Therapy;Gait training;Electrical Stimulation;DME Instruction;Stair training;Functional mobility training;Therapeutic activities;Therapeutic exercise;Balance training;Neuromuscular re-education;Manual techniques;Patient/family education;Passive range of motion;Biofeedback;Moist Heat;Traction;Taping;Dry needling;Scar mobilization;Compression bandaging    PT Next Visit Plan  , core strengthening, R hip MET for posterior pelvic rotation, functional activities standing/ sitting, endurance    PT Home Exercise Plan  balance training    Consulted and Agree with Plan of Care  Patient       Patient will benefit from skilled therapeutic intervention in order to improve the following deficits and impairments:  Abnormal gait, Decreased activity tolerance, Decreased balance, Decreased mobility, Decreased strength, Impaired sensation, Impaired UE functional use, Pain, Decreased knowledge of use of DME, Decreased scar mobility, Increased muscle spasms, Increased fascial restricitons  Visit Diagnosis: Muscle weakness (generalized)  Other symptoms and signs involving the nervous system  Other symptoms and signs involving the musculoskeletal system  Unsteadiness on feet     Problem List Patient Active Problem List   Diagnosis Date Noted  . Cervical myelopathy ([place]) 04/02/2018  . Bilateral arm weakness   . Numbness and tingling in both hands   . Paresthesia and pain of both upper extremities   . Trauma   . Post-operative pain   . Uncomplicated asthma   . Benign essential HTN   . Bradycardia   . Steroid-induced hyperglycemia   . 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 of cervical spinal cord ([place])  03/30/2018  . S/P cervical spinal fusion 04/20/2016  . Cervical radicular pain 03/22/2016  . Hamstring tightness 12/11/2014  . 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 Pain in joint, upper arm 11/17/2014  . Labyrinthitis 05/15/2012  . Sinusitis acute 02/12/2012  . Well adult exam 06/27/2011  . Rash, skin 06/27/2011  .  ADHESIVE CAPSULITIS, LEFT 11/09/2009  . SHOULDER PAIN 09/09/2009  . HAIR LOSS 07/24/2008  . ELEVATED BP 07/24/2008  . CARPAL TUNNEL SYNDROME 03/23/2008  . PARESTHESIA 03/23/2008  . MIGRAINE HEADACHE 08/02/2007   [name] PT, DPT, LAT, ATC  11/25/18  10:19 AM      [facility] [address] [place], [place], [address] Phone: [phone]   Fax:  [phone]  Name: Kelly Wall MRN: [id number] Date of Birth: Feb 08, 1961

## 2018-12-02 ENCOUNTER — Ambulatory Visit: Payer: 59 | Admitting: Occupational Therapy

## 2018-12-04 ENCOUNTER — Encounter: Payer: Self-pay | Admitting: Occupational Therapy

## 2018-12-04 ENCOUNTER — Ambulatory Visit: Payer: 59 | Attending: Physical Medicine & Rehabilitation | Admitting: Physical Therapy

## 2018-12-04 ENCOUNTER — Encounter: Payer: Self-pay | Admitting: Physical Therapy

## 2018-12-04 DIAGNOSIS — R29818 Other symptoms and signs involving the nervous system: Secondary | ICD-10-CM | POA: Diagnosis not present

## 2018-12-04 DIAGNOSIS — M6281 Muscle weakness (generalized): Secondary | ICD-10-CM

## 2018-12-04 DIAGNOSIS — R29898 Other symptoms and signs involving the musculoskeletal system: Secondary | ICD-10-CM

## 2018-12-04 DIAGNOSIS — R208 Other disturbances of skin sensation: Secondary | ICD-10-CM | POA: Insufficient documentation

## 2018-12-04 DIAGNOSIS — R2681 Unsteadiness on feet: Secondary | ICD-10-CM | POA: Insufficient documentation

## 2018-12-04 NOTE — Therapy (Signed)
[place], [place], [address] Phone: [phone]   Fax:  [phone]  Physical Therapy Treatment  Patient Details  Name: Kelly Wall MRN: [id number] Date of Birth: [birth year] Referring Provider (PT): Dr. Alger Simons   Encounter Date: 12/04/2018  PT End of Session - 12/04/18 1525    Visit Number  35    Number of Visits  38    Date for PT Re-Evaluation  12/24/18    Authorization Type  UMR    PT Start Time  1425    PT Stop Time  1500    PT Time Calculation (min)  35 min    Activity Tolerance  Patient tolerated treatment well    Behavior During Therapy  [facility] for tasks assessed/performed       Past Medical History:  Diagnosis Date  . Asthma    child- occ exersie induced now  . Family history of adverse reaction to anesthesia    dad hard to awaken  . Hypertension    no rx  in 5 yrs fish oil helps now  . Medical history non-contributory    no anesthesia    Past Surgical History:  Procedure Laterality Date  . ANTERIOR CERVICAL DECOMP/DISCECTOMY FUSION N/A 03/30/2018   Procedure: ANTERIOR CERVICAL DECOMPRESSION/DISCECTOMY FUSION Cervical four-five and Cervical five-six;  Surgeon: Ashok Pall, MD;  Location: [place];  Service: Neurosurgery;  Laterality: N/A;  . CERVICAL DISC ARTHROPLASTY N/A 04/20/2016   Procedure: Cervical Six-Seven Artificial Disc Replacement-Cervical;  Surgeon: Eustace Moore, MD;  Location: [place] [facility];  Service: Neurosurgery;  Laterality: N/A;    There were no vitals filed for this visit.  Subjective Assessment - 12/04/18 1428    Subjective  Pt c/o lower back pain and neuropathy pain in hands. Reports sharp pain in arms last night.    Currently in Pain?  Yes    Pain Score  4     Pain Location  Back    Pain Descriptors / Indicators  Pressure    Pain Type  Chronic pain;Neuropathic pain    Pain Radiating Towards  Radiates down both arms     Pain Onset  More than a month ago     Aggravating Factors   walking, doing too much    Pain Relieving Factors  Massaging back with topical medication                       [facility] Adult PT Treatment/Exercise - 12/04/18 0001      Knee/Hip Exercises: Standing   Other Standing Knee Exercises  steppin gover 4 hurdles 6 x. utilized distraction during last 2-3 sets which she demonstrated increased hip flexion and improved clearance over the hurdle.       Manual Therapy   Manual therapy comments  MTPR along the R hamstrings    Joint Mobilization  LAD grade 5 on, sacral spinging mobs grade II     Muscle Energy Technique  MET of right hip flexor in prone with combo of ant innominate mob grade III                  PT Long Term Goals - 11/12/18 1212      PT LONG TERM GOAL #1   Title  Patient is independent with updated HEP    Baseline   independent with current HEP; progressing as able.    Time  4    Period  Weeks  Status  On-going    Target Date  12/24/18      PT LONG TERM GOAL #2   Title  Patient's right hip abduction strength will increase to 3+/5 and left hip abduction to 4+/5 with no left trendelenberg with walking.     Period  Weeks    Status  Partially Met      PT LONG TERM GOAL #3   Title  Patient will maintain normal pelvic alignment (as indicated by level ASIS or iliac crests) and report no left SI joint pain with activities.     Time  4    Period  Weeks    Status  On-going      PT LONG TERM GOAL #4   Title  Patient will walk 1000 ft on level outdoor surfaces modified independent without  rt genu recurvatum at least 90% of the time (with or without brace if needed)    Status  Partially Met      PT LONG TERM GOAL #5   Title  Patient will ascend/descend 12 steps with step over step pattern with rail modified independent for access to her upstairs room.     Period  Weeks    Status  Achieved            Plan - 12/04/18 1528    Clinical Impression Statement  pt arriged 25 min  late today. Focsued on MET techniques of the R hip flexors with innominate mobs grade 2-3. due to limited time focused on functional stepping using hurdles which she improved significantly when she wasn't focusing on her hip flexion. soreness noted at the end of session.     PT Treatment/Interventions  ADLs/Self Care Home Management;Aquatic Therapy;Gait training;Electrical Stimulation;DME Instruction;Stair training;Functional mobility training;Therapeutic activities;Therapeutic exercise;Balance training;Neuromuscular re-education;Manual techniques;Patient/family education;Passive range of motion;Biofeedback;Moist Heat;Traction;Taping;Dry needling;Scar mobilization;Compression bandaging    PT Next Visit Plan  , core strengthening, R hip MET for posterior pelvic rotation, functional activities standing/ sitting, endurance    PT Home Exercise Plan  balance training    Consulted and Agree with Plan of Care  Patient       Patient will benefit from skilled therapeutic intervention in order to improve the following deficits and impairments:  Abnormal gait, Decreased activity tolerance, Decreased balance, Decreased mobility, Decreased strength, Impaired sensation, Impaired UE functional use, Pain, Decreased knowledge of use of DME, Decreased scar mobility, Increased muscle spasms, Increased fascial restricitons  Visit Diagnosis: Muscle weakness (generalized)  Other symptoms and signs involving the nervous system  Other symptoms and signs involving the musculoskeletal system  Unsteadiness on feet     Problem List Patient Active Problem List   Diagnosis Date Noted  . Cervical myelopathy ([place]) 04/02/2018  . Bilateral arm weakness   . Numbness and tingling in both hands   . Paresthesia and pain of both upper extremities   . Trauma   . Post-operative pain   . Uncomplicated asthma   . Benign essential HTN   . Bradycardia   . Steroid-induced hyperglycemia   . 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 of  cervical spinal cord ([place]) 03/30/2018  . S/P cervical spinal fusion 04/20/2016  . Cervical radicular pain 03/22/2016  . Hamstring tightness 12/11/2014  . 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 Pain in joint, upper arm 11/17/2014  . Labyrinthitis 05/15/2012  . Sinusitis acute 02/12/2012  . Well adult exam 06/27/2011  . Rash, skin 06/27/2011  . ADHESIVE CAPSULITIS, LEFT 11/09/2009  . SHOULDER PAIN 09/09/2009  . HAIR LOSS 07/24/2008  .  ELEVATED BP 07/24/2008  . CARPAL TUNNEL SYNDROME 03/23/2008  . PARESTHESIA 03/23/2008  . MIGRAINE HEADACHE 08/02/2007   [name] PT, DPT, LAT, ATC  12/04/18  3:31 PM      [facility] [address] [place], [place], [address] Phone: [phone]   Fax:  [phone]  Name: Kelly Wall MRN: [id number] Date of Birth: 1961-05-15

## 2018-12-04 NOTE — Therapy (Signed)
[facility] [address] [place], [place], [address] Phone: [phone]   Fax:  [phone]  Occupational Therapy Treatment  Patient Details  Name: Kelly Wall MRN: [id number] Date of Birth: October 23, 1961 Referring Provider (OT): Dr. Faith Rogue   Encounter Date: 12/04/2018    Past Medical History:  Diagnosis Date  . Asthma    child- occ exersie induced now  . Family history of adverse reaction to anesthesia    dad hard to awaken  . Hypertension    no rx  in 5 yrs fish oil helps now  . Medical history non-contributory    no anesthesia    Past Surgical History:  Procedure Laterality Date  . ANTERIOR CERVICAL DECOMP/DISCECTOMY FUSION N/A 03/30/2018   Procedure: ANTERIOR CERVICAL DECOMPRESSION/DISCECTOMY FUSION Cervical four-five and Cervical five-six;  Surgeon: Coletta Memos, MD;  [facility];  Service: Neurosurgery;  Laterality: N/A;  . CERVICAL DISC ARTHROPLASTY N/A 04/20/2016   Procedure: Cervical Six-Seven Artificial Disc Replacement-Cervical;  Surgeon: Tia Alert, MD;  Location: [facility];  Service: Neurosurgery;  Laterality: N/A;    There were no vitals filed for this visit.                             OT Long Term Goals - 11/12/18 2116      OT LONG TERM GOAL #1   Title  Pt will be mod I with aquatic HEP - 11/18/2018    Status  On-going      OT LONG TERM GOAL #2   Title  Pt will verbalize understanding of water principles relative to spasticity managment, balance and strengthening    Status  On-going      OT LONG TERM GOAL #3   Title  Pt will report greater ease in functional mobility as well as using hands for work, ADL and IADL tasks impacted by stiffness    Status  On-going            Plan - 12/04/18 1247    Clinical Impression Statement  Pt being seen for aquatic therapy. Due to scheduling conflict, pt to be placed on hold until further notice        Patient will benefit from skilled therapeutic intervention in order to improve the following deficits and impairments:     Visit Diagnosis: Muscle weakness (generalized)    Problem List Patient Active Problem List   Diagnosis Date Noted  . Cervical myelopathy ([facility]) 04/02/2018  . Bilateral arm weakness   . Numbness and tingling in both hands   . Paresthesia and pain of both upper extremities   . Trauma   . Post-operative pain   . Uncomplicated asthma   . Benign essential HTN   . Bradycardia   . Steroid-induced hyperglycemia   . 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 of cervical spinal cord ([facility]) 03/30/2018  . S/P cervical spinal fusion 04/20/2016  . Cervical radicular pain 03/22/2016  . Hamstring tightness 12/11/2014  . 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 Pain in joint, upper arm 11/17/2014  . Labyrinthitis 05/15/2012  . Sinusitis acute 02/12/2012  . Well adult exam 06/27/2011  . Rash, skin 06/27/2011  . ADHESIVE CAPSULITIS, LEFT 11/09/2009  . SHOULDER PAIN 09/09/2009  . HAIR LOSS 07/24/2008  . ELEVATED BP 07/24/2008  . CARPAL TUNNEL SYNDROME 03/23/2008  . PARESTHESIA 03/23/2008  . MIGRAINE HEADACHE 08/02/2007    Norton Pastel, OTR/L 12/04/2018, 12:47 PM  [facility] [address] [place], [place], [address] Phone: [phone]   Fax:  [phone]  Name: Kelly Wall MRN: [id number] Date of Birth: 02/27/1961

## 2018-12-09 ENCOUNTER — Encounter: Payer: 59 | Admitting: Occupational Therapy

## 2018-12-09 MED FILL — TRETINOIN 0.025% CREAM: 0.025 | 90 days supply | Qty: 45 | Fill #3

## 2018-12-09 MED FILL — PREGABALIN 150 MG CAPS: 150 | 30 days supply | Qty: 60 | Fill #1

## 2018-12-10 ENCOUNTER — Ambulatory Visit: Payer: 59 | Admitting: Physical Therapy

## 2018-12-10 ENCOUNTER — Encounter: Payer: Self-pay | Admitting: Physical Therapy

## 2018-12-10 DIAGNOSIS — M6281 Muscle weakness (generalized): Secondary | ICD-10-CM | POA: Diagnosis not present

## 2018-12-10 DIAGNOSIS — R29898 Other symptoms and signs involving the musculoskeletal system: Secondary | ICD-10-CM | POA: Diagnosis not present

## 2018-12-10 DIAGNOSIS — R208 Other disturbances of skin sensation: Secondary | ICD-10-CM | POA: Diagnosis not present

## 2018-12-10 DIAGNOSIS — R2681 Unsteadiness on feet: Secondary | ICD-10-CM

## 2018-12-10 DIAGNOSIS — R29818 Other symptoms and signs involving the nervous system: Secondary | ICD-10-CM | POA: Diagnosis not present

## 2018-12-10 NOTE — Therapy (Signed)
[place], [place], [address] Phone: [phone]   Fax:  [phone]  Physical Therapy Treatment  Patient Details  Name: Kelly Wall MRN: [id number] Date of Birth: [birth year] Referring Provider (PT): Dr. Alger Simons   Encounter Date: 12/10/2018  PT End of Session - 12/10/18 1144    Visit Number  36    Number of Visits  38    Date for PT Re-Evaluation  12/24/18    Authorization Type  UMR    PT Start Time  1145    PT Stop Time  1228    PT Time Calculation (min)  43 min    Activity Tolerance  Patient tolerated treatment well    Behavior During Therapy  [facility] for tasks assessed/performed       Past Medical History:  Diagnosis Date  . Asthma    child- occ exersie induced now  . Family history of adverse reaction to anesthesia    dad hard to awaken  . Hypertension    no rx  in 5 yrs fish oil helps now  . Medical history non-contributory    no anesthesia    Past Surgical History:  Procedure Laterality Date  . ANTERIOR CERVICAL DECOMP/DISCECTOMY FUSION N/A 03/30/2018   Procedure: ANTERIOR CERVICAL DECOMPRESSION/DISCECTOMY FUSION Cervical four-five and Cervical five-six;  Surgeon: Ashok Pall, MD;  Location: [place];  Service: Neurosurgery;  Laterality: N/A;  . CERVICAL DISC ARTHROPLASTY N/A 04/20/2016   Procedure: Cervical Six-Seven Artificial Disc Replacement-Cervical;  Surgeon: Eustace Moore, MD;  Location: [place] [facility];  Service: Neurosurgery;  Laterality: N/A;    There were no vitals filed for this visit.  Subjective Assessment - 12/10/18 1145    Subjective  "I walked outside, I think when I activities with my leg it gets aggrivated"    Currently in Pain?  Yes    Pain Score  4     Pain Location  Back    Pain Orientation  Right;Left    Pain Onset  More than a month ago    Pain Frequency  Intermittent                       [facility] Adult PT Treatment/Exercise - 12/10/18  0001      Lumbar Exercises: Stretches   Other Lumbar Stretch Exercise  QL stretch 2 x 30 sec      Lumbar Exercises: Supine   Bent Knee Raise  20 reps   x 3 sets with red theraband around the knees     Knee/Hip Exercises: Standing   Other Standing Knee Exercises  marching in place 2 x 10 with tactile cues for proper height. Cues to avoid letting LLE hit the floor and focus on control      Knee/Hip Exercises: Supine   Other Supine Knee/Hip Exercises  Clams 2 x 15   focusing on on RLE with red theraband     Manual Therapy   Manual therapy comments  MTPR along the R QL x 3    Joint Mobilization  LAD grade 5 on, inferior grade II-III mobs along R innominate in left sidelying    Muscle Energy Technique  RLE limb lengthening during LLE leg press 5 x 10 sec hold                  PT Long Term Goals - 11/12/18 1212      PT LONG TERM GOAL #1   Title  Patient is independent with updated HEP    Baseline   independent with current HEP; progressing as able.    Time  4    Period  Weeks    Status  On-going    Target Date  12/24/18      PT LONG TERM GOAL #2   Title  Patient's right hip abduction strength will increase to 3+/5 and left hip abduction to 4+/5 with no left trendelenberg with walking.     Period  Weeks    Status  Partially Met      PT LONG TERM GOAL #3   Title  Patient will maintain normal pelvic alignment (as indicated by level ASIS or iliac crests) and report no left SI joint pain with activities.     Time  4    Period  Weeks    Status  On-going      PT LONG TERM GOAL #4   Title  Patient will walk 1000 ft on level outdoor surfaces modified independent without  rt genu recurvatum at least 90% of the time (with or without brace if needed)    Status  Partially Met      PT LONG TERM GOAL #5   Title  Patient will ascend/descend 12 steps with step over step pattern with rail modified independent for access to her upstairs room.     Period  Weeks    Status  Achieved             Plan - 12/10/18 1237    Clinical Impression Statement  pt reports continued 4/10 pain today which she reports she has still be able to do alot of activity at home. She demosntrates elevated heights of the iliace crest, ASIS and greater trochanter suggesting potential upslip on the R. Removed heel lift from the R foot and worked on RLE limb lengthening which she reported significant improvement of pain. continued working on hip strengthening which she demonstrated difficulty in standing due to not trusting the RLE but was able to perform the exercise with cues for motivation. pain dropped to 1-2/10 end of session.     PT Treatment/Interventions  ADLs/Self Care Home Management;Aquatic Therapy;Gait training;Electrical Stimulation;DME Instruction;Stair training;Functional mobility training;Therapeutic activities;Therapeutic exercise;Balance training;Neuromuscular re-education;Manual techniques;Patient/family education;Passive range of motion;Biofeedback;Moist Heat;Traction;Taping;Dry needling;Scar mobilization;Compression bandaging    PT Next Visit Plan  hows not using heel lift, core strengthening, R hip MET for posterior pelvic rotation, check hip heights,  functional activities standing/ sitting, endurance    PT Home Exercise Plan  balance training    Consulted and Agree with Plan of Care  Patient       Patient will benefit from skilled therapeutic intervention in order to improve the following deficits and impairments:  Abnormal gait, Decreased activity tolerance, Decreased balance, Decreased mobility, Decreased strength, Impaired sensation, Impaired UE functional use, Pain, Decreased knowledge of use of DME, Decreased scar mobility, Increased muscle spasms, Increased fascial restricitons  Visit Diagnosis: Muscle weakness (generalized)  Unsteadiness on feet  Other symptoms and signs involving the musculoskeletal system     Problem List Patient Active Problem List    Diagnosis Date Noted  . Cervical myelopathy ([place]) 04/02/2018  . Bilateral arm weakness   . Numbness and tingling in both hands   . Paresthesia and pain of both upper extremities   . Trauma   . Post-operative pain   . Uncomplicated asthma   . Benign essential HTN   . Bradycardia   . Steroid-induced hyperglycemia   . Central  cord 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 level of cervical spinal cord ([place]) 03/30/2018  . S/P cervical spinal fusion 04/20/2016  . Cervical radicular pain 03/22/2016  . Hamstring tightness 12/11/2014  . 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 Pain in joint, upper arm 11/17/2014  . Labyrinthitis 05/15/2012  . Sinusitis acute 02/12/2012  . Well adult exam 06/27/2011  . Rash, skin 06/27/2011  . ADHESIVE CAPSULITIS, LEFT 11/09/2009  . SHOULDER PAIN 09/09/2009  . HAIR LOSS 07/24/2008  . ELEVATED BP 07/24/2008  . CARPAL TUNNEL SYNDROME 03/23/2008  . PARESTHESIA 03/23/2008  . MIGRAINE HEADACHE 08/02/2007   [name] PT, DPT, LAT, ATC  12/10/18  12:42 PM      [facility] [address] [place], [place], [address] Phone: [phone]   Fax:  [phone]  Name: AYSSA BENTIVEGNA MRN: [id number] Date of Birth: 11-02-1961

## 2018-12-16 ENCOUNTER — Encounter: Payer: 59 | Admitting: Occupational Therapy

## 2018-12-17 ENCOUNTER — Ambulatory Visit: Payer: 59 | Admitting: Physical Therapy

## 2018-12-23 ENCOUNTER — Encounter: Payer: 59 | Admitting: Occupational Therapy

## 2018-12-23 ENCOUNTER — Encounter: Payer: Self-pay | Admitting: Occupational Therapy

## 2018-12-23 ENCOUNTER — Ambulatory Visit: Payer: 59 | Admitting: Occupational Therapy

## 2018-12-23 DIAGNOSIS — R2681 Unsteadiness on feet: Secondary | ICD-10-CM | POA: Diagnosis not present

## 2018-12-23 DIAGNOSIS — R208 Other disturbances of skin sensation: Secondary | ICD-10-CM

## 2018-12-23 DIAGNOSIS — M6281 Muscle weakness (generalized): Secondary | ICD-10-CM

## 2018-12-23 DIAGNOSIS — R29898 Other symptoms and signs involving the musculoskeletal system: Secondary | ICD-10-CM | POA: Diagnosis not present

## 2018-12-23 DIAGNOSIS — R29818 Other symptoms and signs involving the nervous system: Secondary | ICD-10-CM
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
## 2018-12-24 ENCOUNTER — Ambulatory Visit: Payer: 59 | Admitting: Physical Therapy

## 2018-12-26 ENCOUNTER — Encounter: Payer: Self-pay | Admitting: Physical Therapy

## 2018-12-26 ENCOUNTER — Ambulatory Visit: Payer: 59 | Admitting: Physical Therapy

## 2018-12-26 DIAGNOSIS — R2681 Unsteadiness on feet: Secondary | ICD-10-CM | POA: Diagnosis not present

## 2018-12-26 DIAGNOSIS — R29898 Other symptoms and signs involving the musculoskeletal system: Secondary | ICD-10-CM | POA: Diagnosis not present

## 2018-12-26 DIAGNOSIS — R208 Other disturbances of skin sensation: Secondary | ICD-10-CM | POA: Diagnosis not present

## 2018-12-26 DIAGNOSIS — R29818 Other symptoms and signs involving the nervous system: Secondary | ICD-10-CM | POA: Diagnosis not present

## 2018-12-26 DIAGNOSIS — M6281 Muscle weakness (generalized): Secondary | ICD-10-CM

## 2018-12-26 NOTE — Therapy (Signed)
[place], [place], [address] Phone: [phone]   Fax:  [phone]  Physical Therapy Treatment / Re-certification  Patient Details  Name: Kelly Wall MRN: [id number] Date of Birth: [birth year] Referring Provider (PT): Dr. Alger Simons   Encounter Date: 12/26/2018  PT End of Session - 12/26/18 1242    Visit Number  37    Number of Visits  40    Date for PT Re-Evaluation  01/16/19    Authorization Type  [facility]    PT Start Time  1145    PT Stop Time  1228    PT Time Calculation (min)  43 min    Activity Tolerance  Patient tolerated treatment well    Behavior During Therapy  [facility] for tasks assessed/performed       Past Medical History:  Diagnosis Date  . Asthma    child- occ exersie induced now  . Family history of adverse reaction to anesthesia    dad hard to awaken  . Hypertension    no rx  in 5 yrs fish oil helps now  . Medical history non-contributory    no anesthesia    Past Surgical History:  Procedure Laterality Date  . ANTERIOR CERVICAL DECOMP/DISCECTOMY FUSION N/A 03/30/2018   Procedure: ANTERIOR CERVICAL DECOMPRESSION/DISCECTOMY FUSION Cervical four-five and Cervical five-six;  Surgeon: Ashok Pall, MD;  Location: [place];  Service: Neurosurgery;  Laterality: N/A;  . CERVICAL DISC ARTHROPLASTY N/A 04/20/2016   Procedure: Cervical Six-Seven Artificial Disc Replacement-Cervical;  Surgeon: Eustace Moore, MD;  Location: [place] [facility];  Service: Neurosurgery;  Laterality: N/A;    There were no vitals filed for this visit.  Subjective Assessment - 12/26/18 1150    Subjective  "I am sad I missed last week, I am feeling sore"     Currently in Pain?  Yes    Pain Score  5     Pain Location  Back    Pain Orientation  Right;Left    Pain Type  Chronic pain    Pain Onset  More than a month ago    Pain Frequency  Intermittent                       [facility] Adult PT  Treatment/Exercise - 12/26/18 0001      Knee/Hip Exercises: Stretches   Active Hamstring Stretch  4 reps;30 seconds   PNF contract/ relax     Knee/Hip Exercises: Standing   Other Standing Knee Exercises  lateral band walks 4 x with yellow theraband    Other Standing Knee Exercises  marching in // taking small steps forward 6 x      Knee/Hip Exercises: Supine   Straight Leg Raises  1 set;15 reps;Strengthening;Left      Manual Therapy   Manual therapy comments  MTPR along the R QL x 3, R lumbar paraspinal    Muscle Energy Technique  in L sidelying R hip flexor isometric hold in combination with R innominate anterior mob 1  x 10 holding 10 seconds          Balance Exercises - 12/26/18 1235      Balance Exercises: Standing   Standing Eyes Closed  Narrow base of support (BOS);2 reps;30 secs    Tandem Stance  Eyes open;Eyes closed;4 reps;30 secs   alternating lead leg            PT Long Term Goals - 12/26/18 1244  PT LONG TERM GOAL #1   Title  Patient is independent with updated HEP    Time  4    Period  Weeks    Status  On-going      PT LONG TERM GOAL #2   Title  Patient's right hip abduction strength will increase to 3+/5 and left hip abduction to 4+/5 with no left trendelenberg with walking.     Time  4    Period  Weeks    Status  Unable to assess      PT LONG TERM GOAL #3   Title  Patient will maintain normal pelvic alignment (as indicated by level ASIS or iliac crests) and report no left SI joint pain with activities.     Time  4    Period  Weeks    Status  Partially Met      PT LONG TERM GOAL #4   Title  Patient will walk 1000 ft on level outdoor surfaces modified independent without  rt genu recurvatum at least 90% of the time (with or without brace if needed)    Time  4    Period  Weeks    Status  Achieved      PT LONG TERM GOAL #5   Title  Patient will ascend/descend 12 steps with step over step pattern with rail modified independent for access  to her upstairs room.     Time  4    Period  Weeks    Status  Achieved            Plan - 12/26/18 1239    Clinical Impression Statement  Continued working onthe R SIJ with hamstring stretching followed with R innominate MET. continued working on hip strengthening as well as balance which she did well with demonstrating moderate postural sway. end of session she reported decreased pain/ tightness and sore from exercise. she continues to make progress with strength/ endurance as well as report of decreasing pain. plan to continue working on functional strength and mobility.     PT Frequency  1x / week    PT Duration  3 weeks    PT Treatment/Interventions  ADLs/Self Care Home Management;Aquatic Therapy;Gait training;Electrical Stimulation;DME Instruction;Stair training;Functional mobility training;Therapeutic activities;Therapeutic exercise;Balance training;Neuromuscular re-education;Manual techniques;Patient/family education;Passive range of motion;Biofeedback;Moist Heat;Traction;Taping;Dry needling;Scar mobilization;Compression bandaging    PT Next Visit Plan  core strengthening, R hip MET for posterior pelvic rotation,  functional activities standing/ sitting, endurance training    PT Home Exercise Plan  balance training    Consulted and Agree with Plan of Care  Patient       Patient will benefit from skilled therapeutic intervention in order to improve the following deficits and impairments:  Abnormal gait, Decreased activity tolerance, Decreased balance, Decreased mobility, Decreased strength, Impaired sensation, Impaired UE functional use, Pain, Decreased knowledge of use of DME, Decreased scar mobility, Increased muscle spasms, Increased fascial restricitons  Visit Diagnosis: Muscle weakness (generalized)  Unsteadiness on feet  Other symptoms and signs involving the musculoskeletal system     Problem List Patient Active Problem List   Diagnosis Date Noted  . Cervical  myelopathy ([place]) 04/02/2018  . Bilateral arm weakness   . Numbness and tingling in both hands   . Paresthesia and pain of both upper extremities   . Trauma   . Post-operative pain   . Uncomplicated asthma   . Benign essential HTN   . Bradycardia   . Steroid-induced hyperglycemia   . Central cord syndrome at  C5 level of cervical spinal cord ([place]) 03/30/2018  . S/P cervical spinal fusion 04/20/2016  . Cervical radicular pain 03/22/2016  . Hamstring tightness 12/11/2014  . 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 Pain in joint, upper arm 11/17/2014  . Labyrinthitis 05/15/2012  . Sinusitis acute 02/12/2012  . Well adult exam 06/27/2011  . Rash, skin 06/27/2011  . ADHESIVE CAPSULITIS, LEFT 11/09/2009  . SHOULDER PAIN 09/09/2009  . HAIR LOSS 07/24/2008  . ELEVATED BP 07/24/2008  . CARPAL TUNNEL SYNDROME 03/23/2008  . PARESTHESIA 03/23/2008  . MIGRAINE HEADACHE 08/02/2007   [name] PT, DPT, LAT, ATC  12/26/18  12:48 PM      [facility] [address] [place], [place], [address] Phone: [phone]   Fax:  [phone]  Name: DESIRE FULP MRN: [id number] Date of Birth: [birth year]

## 2018-12-30 ENCOUNTER — Encounter: Payer: Self-pay | Admitting: Occupational Therapy

## 2018-12-30 ENCOUNTER — Ambulatory Visit: Payer: 59 | Attending: Physical Medicine & Rehabilitation | Admitting: Occupational Therapy

## 2018-12-30 DIAGNOSIS — R29898 Other symptoms and signs involving the musculoskeletal system: Secondary | ICD-10-CM | POA: Insufficient documentation

## 2018-12-30 DIAGNOSIS — R29818 Other symptoms and signs involving the nervous system: Secondary | ICD-10-CM | POA: Insufficient documentation

## 2018-12-30 DIAGNOSIS — R2681 Unsteadiness on feet: Secondary | ICD-10-CM | POA: Insufficient documentation

## 2018-12-30 DIAGNOSIS — R208 Other disturbances of skin sensation: Secondary | ICD-10-CM

## 2018-12-30 DIAGNOSIS — M6281 Muscle weakness (generalized): Secondary | ICD-10-CM | POA: Diagnosis not present

## 2018-12-30 MED FILL — BACLOFEN 10 MG TABLET: 10 | 30 days supply | Qty: 90 | Fill #1
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
## 2019-01-06 ENCOUNTER — Ambulatory Visit: Payer: 59 | Admitting: Occupational Therapy

## 2019-01-08 ENCOUNTER — Ambulatory Visit: Payer: 59 | Admitting: Physical Therapy

## 2019-01-08 ENCOUNTER — Encounter: Payer: Self-pay | Admitting: Physical Therapy

## 2019-01-08 DIAGNOSIS — R29898 Other symptoms and signs involving the musculoskeletal system: Secondary | ICD-10-CM | POA: Diagnosis not present

## 2019-01-08 DIAGNOSIS — R208 Other disturbances of skin sensation: Secondary | ICD-10-CM | POA: Diagnosis not present

## 2019-01-08 DIAGNOSIS — R2681 Unsteadiness on feet: Secondary | ICD-10-CM | POA: Diagnosis not present

## 2019-01-08 DIAGNOSIS — R29818 Other symptoms and signs involving the nervous system: Secondary | ICD-10-CM | POA: Diagnosis not present

## 2019-01-08 DIAGNOSIS — M6281 Muscle weakness (generalized): Secondary | ICD-10-CM | POA: Diagnosis not present
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
## 2019-01-10 MED FILL — PREGABALIN 150 MG CAPS: 150 | 30 days supply | Qty: 60 | Fill #2

## 2019-01-10 MED FILL — CLINDAMYCIN PHOS-BENZOYL PE: 1-5 | 30 days supply | Qty: 25 | Fill #5

## 2019-01-13 ENCOUNTER — Encounter: Payer: Self-pay | Admitting: Occupational Therapy

## 2019-01-13 ENCOUNTER — Ambulatory Visit: Payer: 59 | Admitting: Occupational Therapy

## 2019-01-13 DIAGNOSIS — R2681 Unsteadiness on feet: Secondary | ICD-10-CM

## 2019-01-13 DIAGNOSIS — R208 Other disturbances of skin sensation: Secondary | ICD-10-CM | POA: Diagnosis not present

## 2019-01-13 DIAGNOSIS — R29818 Other symptoms and signs involving the nervous system: Secondary | ICD-10-CM

## 2019-01-13 DIAGNOSIS — M6281 Muscle weakness (generalized): Secondary | ICD-10-CM

## 2019-01-13 DIAGNOSIS — R29898 Other symptoms and signs involving the musculoskeletal system: Secondary | ICD-10-CM
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
## 2019-01-14 ENCOUNTER — Encounter: Payer: Self-pay | Admitting: Physical Therapy

## 2019-01-14 ENCOUNTER — Ambulatory Visit: Payer: 59 | Admitting: Physical Therapy

## 2019-01-14 DIAGNOSIS — R29898 Other symptoms and signs involving the musculoskeletal system: Secondary | ICD-10-CM | POA: Diagnosis not present

## 2019-01-14 DIAGNOSIS — R208 Other disturbances of skin sensation: Secondary | ICD-10-CM | POA: Diagnosis not present

## 2019-01-14 DIAGNOSIS — R29818 Other symptoms and signs involving the nervous system: Secondary | ICD-10-CM | POA: Diagnosis not present

## 2019-01-14 DIAGNOSIS — M6281 Muscle weakness (generalized): Secondary | ICD-10-CM

## 2019-01-14 DIAGNOSIS — R2681 Unsteadiness on feet: Secondary | ICD-10-CM | POA: Diagnosis not present

## 2019-01-14 NOTE — Therapy (Signed)
[place], [place], [address] Phone: [phone]   Fax:  [phone]  Physical Therapy Treatment  Patient Details  Name: Kelly Wall MRN: [id number] Date of Birth: Mar 31, 1961 Referring Provider (PT): Dr. Alger Simons   Encounter Date: 01/14/2019  PT End of Session - 01/14/19 0852    Visit Number  39    Number of Visits  40    Date for PT Re-Evaluation  01/16/19    Authorization Type  UMR    PT Start Time  0850    PT Stop Time  0930    PT Time Calculation (min)  40 min    Activity Tolerance  Patient tolerated treatment well    Behavior During Therapy  [facility] for tasks assessed/performed       Past Medical History:  Diagnosis Date  . Asthma    child- occ exersie induced now  . Family history of adverse reaction to anesthesia    dad hard to awaken  . Hypertension    no rx  in 5 yrs fish oil helps now  . Medical history non-contributory    no anesthesia    Past Surgical History:  Procedure Laterality Date  . ANTERIOR CERVICAL DECOMP/DISCECTOMY FUSION N/A 03/30/2018   Procedure: ANTERIOR CERVICAL DECOMPRESSION/DISCECTOMY FUSION Cervical four-five and Cervical five-six;  Surgeon: Ashok Pall, MD;  Location: [place];  Service: Neurosurgery;  Laterality: N/A;  . CERVICAL DISC ARTHROPLASTY N/A 04/20/2016   Procedure: Cervical Six-Seven Artificial Disc Replacement-Cervical;  Surgeon: Eustace Moore, MD;  Location: [place] [facility];  Service: Neurosurgery;  Laterality: N/A;    There were no vitals filed for this visit.  Subjective Assessment - 01/14/19 0852    Subjective  "I am feeling alittle sore, but I did have pool therapy which helps, but I am still sore"     Patient Stated Goals  walk normal again to allow her to resume hiking, long distance walks; reduce rt SI pain    Currently in Pain?  Yes    Pain Score  4     Pain Location  Back    Pain Orientation  Right    Pain Type  Chronic pain    Pain  Onset  More than a month ago    Pain Frequency  Intermittent                       [facility] Adult PT Treatment/Exercise - 01/14/19 0001      Self-Care   Self-Care  Posture    Posture  avoiding trunk extension and avoiding resting on the Y's, and standing up straight      Lumbar Exercises: Stretches   Active Hamstring Stretch  2 reps;30 seconds;Left;Right      Lumbar Exercises: Supine   Ab Set  10 reps   in decmpression postion   Other Supine Lumbar Exercises  decompression position: core strengthening with abdominal  strength with contralateral UE/LE activation    Other Supine Lumbar Exercises  bil bicep 2 x 10 with 8#   1 set resting back on wall, and 1 set standing      Knee/Hip Exercises: Aerobic   Nustep  L7 x 4 min   L      Knee/Hip Exercises: Supine   Other Supine Knee/Hip Exercises  bil hip flexion with blue ball betweenthe knees 2 x 10   from decompression position  PT Education - 01/14/19 0910    Education Details  standing posture avoiding leaning back with stnading or during exercise to reduce stress that is placed on the low back.    Person(s) Educated  Patient    Methods  Explanation;Verbal cues;Handout    Comprehension  Verbal cues required          PT Long Term Goals - 12/26/18 1244      PT LONG TERM GOAL #1   Title  Patient is independent with updated HEP    Time  4    Period  Weeks    Status  On-going      PT LONG TERM GOAL #2   Title  Patient's right hip abduction strength will increase to 3+/5 and left hip abduction to 4+/5 with no left trendelenberg with walking.     Time  4    Period  Weeks    Status  Unable to assess      PT LONG TERM GOAL #3   Title  Patient will maintain normal pelvic alignment (as indicated by level ASIS or iliac crests) and report no left SI joint pain with activities.     Time  4    Period  Weeks    Status  Partially Met      PT LONG TERM GOAL #4   Title  Patient will walk 1000 ft on  level outdoor surfaces modified independent without  rt genu recurvatum at least 90% of the time (with or without brace if needed)    Time  4    Period  Weeks    Status  Achieved      PT LONG TERM GOAL #5   Title  Patient will ascend/descend 12 steps with step over step pattern with rail modified independent for access to her upstairs room.     Time  4    Period  Weeks    Status  Achieved            Plan - 01/14/19 8527    Clinical Impression Statement  pt reports continues soreness in the low back / SI region. standing posture pt demonstrates sway back position and rest on the Y ligaments. focused on posture and core strengthening today, and demonstration for cues to stand up straight and avoiding leaning back. end of session she reported decreased pain / soreness at end of session.     PT Next Visit Plan  ERO, posture education core strengthening, R hip MET for posterior pelvic rotation,  functional activities standing/ sitting, endurance training    PT Home Exercise Plan  balance training    Consulted and Agree with Plan of Care  Patient       Patient will benefit from skilled therapeutic intervention in order to improve the following deficits and impairments:  Abnormal gait, Decreased activity tolerance, Decreased balance, Decreased mobility, Decreased strength, Impaired sensation, Impaired UE functional use, Pain, Decreased knowledge of use of DME, Decreased scar mobility, Increased muscle spasms, Increased fascial restricitons  Visit Diagnosis: Muscle weakness (generalized)  Other symptoms and signs involving the musculoskeletal system  Unsteadiness on feet  Other symptoms and signs involving the nervous system     Problem List Patient Active Problem List   Diagnosis Date Noted  . Cervical myelopathy ([place]) 04/02/2018  . Bilateral arm weakness   . Numbness and tingling in both hands   . Paresthesia and pain of both upper extremities   . Trauma   . Post-operative  pain   . Uncomplicated asthma   . Benign essential HTN   . Bradycardia   . Steroid-induced hyperglycemia   . 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 of cervical spinal cord ([place]) 03/30/2018  . S/P cervical spinal fusion 04/20/2016  . Cervical radicular pain 03/22/2016  . Hamstring tightness 12/11/2014  . 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 Pain in joint, upper arm 11/17/2014  . Labyrinthitis 05/15/2012  . Sinusitis acute 02/12/2012  . Well adult exam 06/27/2011  . Rash, skin 06/27/2011  . ADHESIVE CAPSULITIS, LEFT 11/09/2009  . SHOULDER PAIN 09/09/2009  . HAIR LOSS 07/24/2008  . ELEVATED BP 07/24/2008  . CARPAL TUNNEL SYNDROME 03/23/2008  . PARESTHESIA 03/23/2008  . MIGRAINE HEADACHE 08/02/2007   [name] PT, DPT, LAT, ATC  01/14/19  9:34 AM      [facility] [address] [place], [place], [address] Phone: [phone]   Fax:  [phone]  Name: Kelly Wall MRN: [id number] Date of Birth: 1961-01-06

## 2019-01-15 ENCOUNTER — Encounter: Payer: 59 | Admitting: Physical Medicine & Rehabilitation

## 2019-01-15 ENCOUNTER — Telehealth: Payer: Self-pay | Admitting: Physical Medicine & Rehabilitation

## 2019-01-15 DIAGNOSIS — M79602 Pain in left arm: Principal | ICD-10-CM

## 2019-01-15 DIAGNOSIS — R202 Paresthesia of skin: Principal | ICD-10-CM

## 2019-01-15 DIAGNOSIS — S14125S 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 of cervical spinal cord, sequela: Secondary | ICD-10-CM

## 2019-01-15 DIAGNOSIS — M79601 Pain in right arm: Secondary | ICD-10-CM

## 2019-01-15 MED ORDER — PREGABALIN 150 MG PO CAPS: ORAL_CAPSULE | ORAL | 3 refills | Status: DC

## 2019-01-15 NOTE — Telephone Encounter (Signed)
Done

## 2019-01-15 NOTE — Telephone Encounter (Signed)
Patient requested Lyrica to be sent to pharmacy due to schedule change.

## 2019-01-20 ENCOUNTER — Ambulatory Visit: Payer: 59 | Admitting: Occupational Therapy

## 2019-01-22 ENCOUNTER — Encounter: Payer: Self-pay | Admitting: Physical Therapy

## 2019-01-22 ENCOUNTER — Ambulatory Visit: Payer: 59 | Admitting: Physical Therapy

## 2019-01-22 DIAGNOSIS — M6281 Muscle weakness (generalized): Secondary | ICD-10-CM

## 2019-01-22 DIAGNOSIS — R29818 Other symptoms and signs involving the nervous system: Secondary | ICD-10-CM | POA: Diagnosis not present

## 2019-01-22 DIAGNOSIS — R29898 Other symptoms and signs involving the musculoskeletal system: Secondary | ICD-10-CM

## 2019-01-22 DIAGNOSIS — R208 Other disturbances of skin sensation: Secondary | ICD-10-CM

## 2019-01-22 DIAGNOSIS — R2681 Unsteadiness on feet: Secondary | ICD-10-CM | POA: Diagnosis not present
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
## 2019-01-27 ENCOUNTER — Ambulatory Visit: Payer: 59 | Admitting: Occupational Therapy

## 2019-01-30 ENCOUNTER — Other Ambulatory Visit: Payer: Self-pay | Admitting: Physical Medicine & Rehabilitation

## 2019-01-31 MED FILL — BACLOFEN 10 MG TABS: 10 | 30 days supply | Qty: 90 | Fill #0

## 2019-02-03 ENCOUNTER — Ambulatory Visit: Payer: 59 | Admitting: Occupational Therapy

## 2019-02-05 ENCOUNTER — Encounter: Payer: Self-pay | Admitting: Physical Medicine & Rehabilitation

## 2019-02-05 ENCOUNTER — Other Ambulatory Visit: Payer: Self-pay

## 2019-02-05 ENCOUNTER — Encounter: Payer: 59 | Attending: Physical Medicine & Rehabilitation | Admitting: Physical Medicine & Rehabilitation

## 2019-02-05 VITALS — BP 139/81 | HR 72 | Ht 61.0 in | Wt 119.0 lb

## 2019-02-05 DIAGNOSIS — M47816 Spondylosis without myelopathy or radiculopathy, lumbar region: Secondary | ICD-10-CM

## 2019-02-05 DIAGNOSIS — S14125S 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 of cervical spinal cord, sequela: Secondary | ICD-10-CM | POA: Diagnosis not present

## 2019-02-05 MED ORDER — DICLOFENAC SODIUM 50 MG PO TBEC: 50.0000 mg | DELAYED_RELEASE_TABLET | Freq: Two times a day (BID) | ORAL | 3 refills | Status: DC

## 2019-02-05 MED FILL — DICLOFENAC SOD EC 50 MG TAB: 50 | 30 days supply | Qty: 60 | Fill #0 | Status: TO

## 2019-02-05 MED FILL — DROSPIR-ETH ESTRA 3/.02 MG: 3-0.02 | 84 days supply | Qty: 84 | Fill #2 | Status: TO

## 2019-02-05 NOTE — Patient Instructions (Signed)
PLEASE FEEL FREE TO CALL OUR OFFICE WITH ANY PROBLEMS OR QUESTIONS ([phone])      
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
## 2019-02-07 ENCOUNTER — Other Ambulatory Visit: Payer: Self-pay

## 2019-02-07 ENCOUNTER — Ambulatory Visit
Admission: RE | Admit: 2019-02-07 | Discharge: 2019-02-07 | Disposition: A | Discharge status: Home / Self Care | Payer: 59 | Source: Ambulatory Visit | Attending: Physical Medicine & Rehabilitation | Admitting: Physical Medicine & Rehabilitation

## 2019-02-07 DIAGNOSIS — M5116 Intervertebral disc disorders with radiculopathy, lumbar region: Secondary | ICD-10-CM | POA: Diagnosis not present

## 2019-02-07 DIAGNOSIS — M47816 Spondylosis without myelopathy or radiculopathy, lumbar region: Secondary | ICD-10-CM

## 2019-02-10 MED FILL — PREGABALIN 150 MG CAPS: 150 | 30 days supply | Qty: 60 | Fill #0 | Status: TO

## 2019-02-11 ENCOUNTER — Telehealth (HOSPITAL_COMMUNITY): Payer: Self-pay | Admitting: Physical Medicine & Rehabilitation

## 2019-02-11 NOTE — Telephone Encounter (Signed)
Let [name] know that lumbar xray is consistent with what I saw on exam in office. Continue with current plan, stretches for now.

## 2019-02-12 NOTE — Telephone Encounter (Signed)
Left voicemail explaining Dr. Rosalyn Charters findings

## 2019-02-17 ENCOUNTER — Encounter: Payer: Self-pay | Admitting: Occupational Therapy

## 2019-02-17 DIAGNOSIS — M6281 Muscle weakness (generalized): Secondary | ICD-10-CM
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
## 2019-03-07 MED FILL — DICLOFENAC SOD EC 50 MG TAB: 50 | 30 days supply | Qty: 60 | Fill #0

## 2019-03-10 MED FILL — TRETINOIN 0.025 % CREA: 0.025 | 90 days supply | Qty: 45 | Fill #0 | Status: TO

## 2019-03-10 MED FILL — PREGABALIN 150 MG CAPS: 150 | 30 days supply | Qty: 60 | Fill #0

## 2019-04-07 MED FILL — PREGABALIN 150 MG CAPS: 150 | 30 days supply | Qty: 60 | Fill #1 | Status: TO

## 2019-04-07 MED FILL — DICLOFENAC SOD EC 50 MG TAB: 50 | 30 days supply | Qty: 60 | Fill #1 | Status: TO

## 2019-04-08 MED FILL — CLINDAMYCIN PHOS-BENZOYL PE: 1-5 | 30 days supply | Qty: 50 | Fill #0 | Status: TO

## 2019-04-16 ENCOUNTER — Encounter: Payer: 59 | Attending: Physical Medicine & Rehabilitation | Admitting: Physical Medicine & Rehabilitation

## 2019-04-16 DIAGNOSIS — S14125S 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 of cervical spinal cord, sequela: Secondary | ICD-10-CM | POA: Insufficient documentation

## 2019-04-16 DIAGNOSIS — M47816 Spondylosis without myelopathy or radiculopathy, lumbar region: Secondary | ICD-10-CM | POA: Insufficient documentation

## 2019-04-17 MED FILL — DROSPIR-ETH ESTRA 3/.02 MG: 3-0.02 | 84 days supply | Qty: 84 | Fill #0

## 2019-05-01 MED FILL — DICLOFENAC SOD EC 50 MG TAB: 50 | 30 days supply | Qty: 60 | Fill #0

## 2019-05-08 MED FILL — BACLOFEN 10 MG TABS: 10 | 30 days supply | Qty: 90 | Fill #1

## 2019-05-08 MED FILL — PREGABALIN 150 MG CAPS: 150 | 30 days supply | Qty: 60 | Fill #0

## 2019-05-28 ENCOUNTER — Other Ambulatory Visit: Payer: Self-pay

## 2019-05-28 ENCOUNTER — Telehealth: Payer: Self-pay

## 2019-05-28 ENCOUNTER — Ambulatory Visit: Payer: 59 | Attending: Physical Medicine & Rehabilitation | Admitting: Physical Therapy

## 2019-05-28 DIAGNOSIS — M6281 Muscle weakness (generalized): Secondary | ICD-10-CM | POA: Insufficient documentation

## 2019-05-28 NOTE — Telephone Encounter (Signed)
Patient called requesting PT for SI Joint pain. She is asking for referral to be sent to [facility] at [facility] [place].

## 2019-05-28 NOTE — Therapy (Signed)
[facility] [address] [place], [place], [address] Phone: [phone]   Fax:  [phone]  Patient Details  Name: Kelly Wall MRN: [id number] Date of Birth: September 01, 1961 Referring Provider:  Cassandria Anger, MD  Encounter Date: 05/28/2019  Pt requested visit today to discuss her ongoing pelvic/SI joint pain and current functional level and to seek advice from therapist about what other treatment options are available.  Discussed multiple options including: aquatic, SI belt and pelvic PT.  Pt reports she tried aquatic but due to nerve pain, was unable to tolerate cooler water temperatures.  Pt has also utilized a SI belt with some relief but is unable to wear it all day.  Pt would like to remain as active as possible.  Pt is open to trying pelvic PT at [facility] clinic.  Will D/C pt from Neuro and Ortho care today.  Pt to seek new referral for pelvic PT.  PHYSICAL THERAPY DISCHARGE SUMMARY  Visits from Start of Care: 40  Current functional level related to goals / functional outcomes: PT Long Term Goals - 01/22/19 1606      PT LONG TERM GOAL #1   Title  Patient is independent with updated HEP    Time  4    Period  Weeks    Status  On-going      PT LONG TERM GOAL #2   Title  Patient's right hip abduction strength will increase to 3+/5 and left hip abduction to 4+/5 with no left trendelenberg with walking.     Time  4    Period  Weeks    Status  Partially Met      PT LONG TERM GOAL #3   Title  Patient will maintain normal pelvic alignment (as indicated by level ASIS or iliac crests) and report no left SI joint pain with activities.     Time  4    Period  Weeks    Status  Partially Met      PT LONG TERM GOAL #4   Title  Patient will walk 1000 ft on level outdoor surfaces modified independent without  rt genu recurvatum at least 90% of the time (with or without brace if needed)    Period  Weeks    Status   Achieved      PT LONG TERM GOAL #5   Title  Patient will ascend/descend 12 steps with step over step pattern with rail modified independent for access to her upstairs room.     Period  Weeks    Status  Achieved      Pt partially met goals but chose to stop PT due to pain progressively worsening with treatment.  Pt returns today to discuss other options.  Pt does not wish to continue with Neuro or Ortho PT but is willing to try Pelvic PT with specialist.  Remaining deficits: Quadriparesis, impaired gait, pelvic/SI joint pain, falls    Education / Equipment: How to obtain referral for Pelvic PT, HEP  Plan: Patient agrees to discharge.  Patient goals were partially met. Patient is being discharged due to the patient's request.  ?????     Rico Junker, PT, DPT 05/28/19    12:39 PM    [facility] [address] [place], [place], [address] Phone: [phone]   Fax:  [phone]

## 2019-05-28 NOTE — Telephone Encounter (Signed)
I think Kelly Wall no-showed for her last appointment. Also, I am unable to make PT referrals without seeing a pt prior within 30 days. Perhaps she can see [name] or her primary for an exam and referral.

## 2019-05-29 ENCOUNTER — Ambulatory Visit: Payer: 59 | Attending: Physical Medicine & Rehabilitation | Admitting: Physical Therapy

## 2019-05-29 ENCOUNTER — Encounter: Payer: Self-pay | Admitting: Physical Therapy

## 2019-05-29 DIAGNOSIS — M6281 Muscle weakness (generalized): Secondary | ICD-10-CM | POA: Diagnosis not present

## 2019-05-29 DIAGNOSIS — R2689 Other abnormalities of gait and mobility: Secondary | ICD-10-CM

## 2019-05-29 DIAGNOSIS — R278 Other lack of coordination: Secondary | ICD-10-CM | POA: Diagnosis present

## 2019-05-29 DIAGNOSIS — M545 Low back pain, unspecified: Secondary | ICD-10-CM

## 2019-05-29 DIAGNOSIS — M62838 Other muscle spasm: Secondary | ICD-10-CM | POA: Diagnosis present

## 2019-05-29 NOTE — Telephone Encounter (Signed)
Left patient a detailed message to call back and schedule with [name] or call PCP for referral.
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
## 2019-05-29 NOTE — Patient Instructions (Addendum)
About Abdominal Massage  Abdominal massage, also called external colon massage, is a self-treatment circular massage technique that can reduce and eliminate gas and ease constipation. The colon naturally contracts in waves in a clockwise direction starting from inside the right hip, moving up toward the ribs, across the belly, and down inside the left hip.  When you perform circular abdominal massage, you help stimulate your colon's normal wave pattern of movement called peristalsis.  It is most beneficial when done after eating.  Positioning You can practice abdominal massage with oil while lying down, or in the shower with soap.  Some people find that it is just as effective to do the massage through clothing while sitting or standing.  How to Massage Start by placing your finger tips or knuckles on your right side, just inside your hip bone.  . Make small circular movements while you move upward toward your rib cage.   . Once you reach the bottom right side of your rib cage, take your circular movements across to the left side of the bottom of your rib cage.  . Next, move downward until you reach the inside of your left hip bone.  This is the path your feces travel in your colon. . Continue to perform your abdominal massage in this pattern for 10 minutes each day.     You can apply as much pressure as is comfortable in your massage.  Start gently and build pressure as you continue to practice.  Notice any areas of pain as you massage; areas of slight pain may be relieved as you massage, but if you have areas of significant or intense pain, consult with your healthcare provider.  Other Considerations . General physical activity including bending and stretching can have a beneficial massage-like effect on the colon.  Deep breathing can also stimulate the colon because breathing deeply activates the same nervous system that supplies the colon.   . Abdominal massage should always be used in  combination with a bowel-conscious diet that is high in the proper type of fiber for you, fluids (primarily water), and a regular exercise program. [facility] [address], [address] [place], [place] [address] Phone # [phone] Fax [phone]  

## 2019-06-03 NOTE — Telephone Encounter (Signed)
I have sent an inbox email to [facility] notifying her that she has to have an appt with [place] to do the recert referral. We will try to get her in with [facility] asap.

## 2019-06-04 ENCOUNTER — Encounter: Payer: Self-pay | Admitting: Registered Nurse

## 2019-06-04 ENCOUNTER — Encounter: Payer: 59 | Attending: Physical Medicine & Rehabilitation | Admitting: Registered Nurse

## 2019-06-04 ENCOUNTER — Other Ambulatory Visit: Payer: Self-pay

## 2019-06-04 VITALS — BP 144/84 | HR 60 | Temp 97.6°F | Ht 61.0 in | Wt 116.0 lb

## 2019-06-04 DIAGNOSIS — M47816 Spondylosis without myelopathy or radiculopathy, lumbar region: Secondary | ICD-10-CM | POA: Insufficient documentation

## 2019-06-04 DIAGNOSIS — Z79899 Other long term (current) drug therapy: Secondary | ICD-10-CM

## 2019-06-04 DIAGNOSIS — G8929 Other chronic pain: Secondary | ICD-10-CM

## 2019-06-04 DIAGNOSIS — M4712 Other spondylosis with myelopathy, cervical region: Secondary | ICD-10-CM | POA: Diagnosis not present

## 2019-06-04 DIAGNOSIS — M79602 Pain in left arm: Secondary | ICD-10-CM

## 2019-06-04 DIAGNOSIS — S14125S 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 of cervical spinal cord, sequela: Secondary | ICD-10-CM | POA: Diagnosis not present

## 2019-06-04 DIAGNOSIS — Z5181 Encounter for therapeutic drug level monitoring: Secondary | ICD-10-CM

## 2019-06-04 DIAGNOSIS — M79601 Pain in right arm: Secondary | ICD-10-CM | POA: Diagnosis not present

## 2019-06-04 DIAGNOSIS — G894 Chronic pain syndrome: Secondary | ICD-10-CM

## 2019-06-04 DIAGNOSIS — M533 Sacrococcygeal disorders, not elsewhere classified: Secondary | ICD-10-CM

## 2019-06-04 DIAGNOSIS — R202 Paresthesia of skin: Secondary | ICD-10-CM

## 2019-06-04 MED ORDER — DICLOFENAC SODIUM 50 MG PO TBEC: 50.0000 mg | DELAYED_RELEASE_TABLET | Freq: Two times a day (BID) | ORAL | 3 refills | Status: DC

## 2019-06-04 MED ORDER — PREGABALIN 150 MG PO CAPS: ORAL_CAPSULE | ORAL | 3 refills | Status: DC

## 2019-06-04 MED FILL — PREGABALIN 150 MG CAPS: 150 | 30 days supply | Qty: 60 | Fill #0

## 2019-06-04 MED FILL — DICLOFENAC SOD EC 50 MG TAB: 50 | 30 days supply | Qty: 60 | Fill #0
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
## 2019-06-10 ENCOUNTER — Other Ambulatory Visit: Payer: Self-pay

## 2019-06-10 ENCOUNTER — Encounter: Payer: Self-pay | Admitting: Physical Therapy

## 2019-06-10 ENCOUNTER — Ambulatory Visit: Payer: 59 | Admitting: Physical Therapy

## 2019-06-10 DIAGNOSIS — M62838 Other muscle spasm: Secondary | ICD-10-CM

## 2019-06-10 DIAGNOSIS — R2689 Other abnormalities of gait and mobility: Secondary | ICD-10-CM

## 2019-06-10 DIAGNOSIS — M6281 Muscle weakness (generalized): Secondary | ICD-10-CM

## 2019-06-10 DIAGNOSIS — M545 Low back pain, unspecified: Secondary | ICD-10-CM

## 2019-06-10 DIAGNOSIS — R278 Other lack of coordination: Secondary | ICD-10-CM | POA: Diagnosis not present

## 2019-06-10 NOTE — Therapy (Signed)
[facility] [address] [place], [place], [address] Phone: [phone]   Fax:  [phone]  Physical Therapy Treatment  Patient Details  Name: Kelly Wall MRN: [id number] Date of Birth: 02-12-1961 Referring Provider (PT): Dr. Alger Simons   Encounter Date: 06/10/2019  PT End of Session - 06/10/19 1507    Visit Number  2    Date for PT Re-Evaluation  08/21/19    PT Start Time  1400    PT Stop Time  1450    PT Time Calculation (min)  50 min    Activity Tolerance  Patient tolerated treatment well;No increased pain    Behavior During Therapy  WFL for tasks assessed/performed       Past Medical History:  Diagnosis Date  . Asthma    child- occ exersie induced now  . Family history of adverse reaction to anesthesia    dad hard to awaken  . Hypertension    no rx  in 5 yrs fish oil helps now  . Medical history non-contributory    no anesthesia    Past Surgical History:  Procedure Laterality Date  . ANTERIOR CERVICAL DECOMP/DISCECTOMY FUSION N/A 03/30/2018   Procedure: ANTERIOR CERVICAL DECOMPRESSION/DISCECTOMY FUSION Cervical four-five and Cervical five-six;  Surgeon: Ashok Pall, MD;  Location: [facility];  Service: Neurosurgery;  Laterality: N/A;  . CERVICAL DISC ARTHROPLASTY N/A 04/20/2016   Procedure: Cervical Six-Seven Artificial Disc Replacement-Cervical;  Surgeon: Eustace Moore, MD;  Location: [facility];  Service: Neurosurgery;  Laterality: N/A;    There were no vitals filed for this visit.  Subjective Assessment - 06/10/19 1407    Subjective  My biggest problem is when I bend down and come back up and when I sit on the commode I feel the pain. My leg will give out.    Patient Stated Goals  be able to walk without pain, walk with a limp    Currently in Pain?  Yes    Pain Score  5     Pain Location  Buttocks    Pain Orientation  Right    Pain Descriptors / Indicators  Constant    Pain Type  Acute  pain    Pain Onset  More than a month ago    Aggravating Factors   going from sit to stand and extend hips, sitting on commode and travels into right leg, walking    Pain Relieving Factors  sitting, cycling                       [facility] Adult PT Treatment/Exercise - 06/10/19 0001      Therapeutic Activites    Therapeutic Activities  ADL's    ADL's  turning in bed with core bracing and breathing, sidely to sit; sit to stand and and back with reducing trunk flexion and increasing hip flexion      Lumbar Exercises: Seated   Other Seated Lumbar Exercises  pull the pelvic floor muscles upward and bring the sit bones togerther while monitoring with pain      Lumbar Exercises: Supine   Clam  5 reps   on each side   Clam Limitations  tactile cues to contract the lower abdominals      Manual Therapy   Manual Therapy  Joint mobilization;Soft tissue mobilization;Myofascial release;Muscle Energy Technique    Joint Mobilization  right hip mobilization for distraction and inferior glide; P-A mobilization to T5-L5 grade 2; correct  sacrum rotated right, release of sacral extension    Soft tissue mobilization  right diaphragm, right levator ani and obturator internist    Myofascial Release  right leg pull to release tissue the fascia through out the lower leg,     Muscle Energy Technique  to correct right anteriorly rotated ilium             PT Education - 06/10/19 1459    Education Details  Access Code: [email or web address]    Person(s) Educated  Patient    Methods  Explanation;Demonstration;Verbal cues;Handout    Comprehension  Verbalized understanding;Returned demonstration       PT Short Term Goals - 06/10/19 1510      PT SHORT TERM GOAL #1   Title  independent with initial HEP    Time  4    Period  Weeks    Status  On-going      PT SHORT TERM GOAL #2   Title  sit with pain decreaesd >/= 25% due to relaxation of muscles    Time  4    Period  Weeks    Status  On-going       PT SHORT TERM GOAL #3   Title  able to walk with pain in right buttocks decreased >/= 25%    Time  4    Period  Weeks    Status  On-going      PT SHORT TERM GOAL #4   Title  understand correct toileting technique to push stool out with greater ease    Time  4    Period  Weeks    Status  New    Target Date  06/26/19        PT Long Term Goals - 05/29/19 1153      PT LONG TERM GOAL #1   Title  Patient is independent with updated HEP    Baseline  --    Time  12    Period  Weeks    Status  New    Target Date  08/21/19      PT LONG TERM GOAL #2   Title  able to ambulate with increase clearance of right foot due to improved pain    Baseline  -    Time  12    Period  Weeks    Status  New    Target Date  08/21/19      PT LONG TERM GOAL #3   Title  able to sit on the commode with 75% less pain due to reduction in pelvic floor trigger points    Baseline  --    Time  12    Period  Weeks    Status  New    Target Date  08/21/19      PT LONG TERM GOAL #4   Title  able to turn in bed with right buttock pain decreased >/= 75%    Baseline  --    Time  12    Period  Weeks    Status  New    Target Date  08/21/19      PT LONG TERM GOAL #5   Title  able to push stool out with >/= 50% greater ease due to pelvic floor strength >/= 3/5    Baseline  --    Time  12    Period  Weeks    Status  New    Target Date  08/21/19  Plan - 06/10/19 1410    Clinical Impression Statement  Patient was able to roll on mat easier and using her core plus breathing out. Patient continues to have pain in right medial ischial tuberosity. Patient has decreased control with right leg when doing the clam and trying to engage her lower abdominals. Patient able to go from sit to stand using her hands in her groin and increasing hip flexion and keep lumbar lordosis. Patient will benefit from skilled therapy to mprove muscle coordnation, reduce right buttocks pain, and improve mobility, and  function.    Personal Factors and Comorbidities  Comorbidity 1;Past/Current Experience    Comorbidities  ANTERIOR CERVICAL DECOMP/DISCECTOMY FUSION;CERVICAL DISC ARTHROPLASTY    Examination-Activity Limitations  Locomotion Level;Sit;Stand;Toileting;Bed Mobility    Examination-Participation Restrictions  Community Activity;Cleaning    Stability/Clinical Decision Making  Evolving/Moderate complexity    Rehab Potential  Good    PT Frequency  1x / week    PT Duration  12 weeks    PT Treatment/Interventions  Biofeedback;Cryotherapy;Electrical Stimulation;Iontophoresis 4mg /ml Dexamethasone;Moist Heat;Ultrasound;Therapeutic exercise;Therapeutic activities;Gait training;Neuromuscular re-education;Patient/family education;Dry needling;Manual techniques    PT Next Visit Plan  soft tissue work to the pelvic floor, diaphgramatic breathing, correct pelvis gently, sacral mobilization, pelvic floor mediation, stretch to open the pelvic floor, ask about the advance directive question    PT Home Exercise Plan  Access Code: [email or web address]    Recommended Other Services  MD signed initial eval    Consulted and Agree with Plan of Care  Patient       Patient will benefit from skilled therapeutic intervention in order to improve the following deficits and impairments:  Abnormal gait, Decreased coordination, Increased fascial restricitons, Decreased endurance, Increased muscle spasms, Decreased activity tolerance, Pain, Decreased strength, Decreased mobility  Visit Diagnosis: 1. Acute right-sided low back pain without sciatica   2. Other muscle spasm   3. Muscle weakness (generalized)   4. Other abnormalities of gait and mobility   5. Other lack of coordination        Problem List Patient Active Problem List   Diagnosis Date Noted  . Lumbar facet arthropathy 02/05/2019  . Cervical myelopathy (HCC) 04/02/2018  . Bilateral arm weakness   . Numbness and tingling in both hands   . Paresthesia and pain of both  upper extremities   . Trauma   . Post-operative pain   . Uncomplicated asthma   . Benign essential HTN   . Bradycardia   . Steroid-induced hyperglycemia   . 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 of cervical spinal cord (HCC) 03/30/2018  . S/P cervical spinal fusion 04/20/2016  . Cervical radicular pain 03/22/2016  . Hamstring tightness 12/11/2014  . 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 Pain in joint, upper arm 11/17/2014  . Labyrinthitis 05/15/2012  . Sinusitis acute 02/12/2012  . Well adult exam 06/27/2011  . Rash, skin 06/27/2011  . ADHESIVE CAPSULITIS, LEFT 11/09/2009  . SHOULDER PAIN 09/09/2009  . HAIR LOSS 07/24/2008  . ELEVATED BP 07/24/2008  . CARPAL TUNNEL SYNDROME 03/23/2008  . PARESTHESIA 03/23/2008  . MIGRAINE HEADACHE 08/02/2007    Eulis Fosterheryl Zayden Maffei, PT 06/10/19 3:13 PM   [facility] [address] [place], [place], [address] Phone: [phone]   Fax:  [phone]  Name: Welford RocheRosario P Situ MRN: [id number] Date of Birth: [birth year]

## 2019-06-10 NOTE — Patient Instructions (Signed)
Access Code: [email or web address]  URL: [email or web address]  Date: 06/10/2019  Prepared by: Earlie Counts   Exercises Bent Knee Fallouts - 5 reps - 1 sets - 1x daily - 7x weekly [facility] [address], [place], [place] [address] Phone # [phone] Fax [phone]

## 2019-06-13 ENCOUNTER — Encounter

## 2019-06-18 ENCOUNTER — Telehealth: Payer: Self-pay | Admitting: *Deleted

## 2019-06-18 ENCOUNTER — Encounter: Payer: Self-pay | Admitting: Physical Therapy

## 2019-06-18 ENCOUNTER — Other Ambulatory Visit: Payer: Self-pay

## 2019-06-18 ENCOUNTER — Ambulatory Visit: Payer: 59 | Admitting: Physical Therapy

## 2019-06-18 DIAGNOSIS — M6281 Muscle weakness (generalized): Secondary | ICD-10-CM

## 2019-06-18 DIAGNOSIS — M62838 Other muscle spasm: Secondary | ICD-10-CM | POA: Diagnosis not present

## 2019-06-18 DIAGNOSIS — R2689 Other abnormalities of gait and mobility: Secondary | ICD-10-CM

## 2019-06-18 DIAGNOSIS — M545 Low back pain, unspecified: Secondary | ICD-10-CM

## 2019-06-18 DIAGNOSIS — R278 Other lack of coordination: Secondary | ICD-10-CM

## 2019-06-18 NOTE — Telephone Encounter (Signed)
Ok Thx 

## 2019-06-18 NOTE — Therapy (Signed)
[facility] [address], [address] [place], [place], [address] Phone: [phone]   Fax:  [phone]  Physical Therapy Treatment  Patient Details  Name: [facility] MRN: [id number] Date of Birth: Sep 26, 1961 Referring Provider (PT): Dr. Faith RogueZachary Swartz   Encounter Date: 06/18/2019  PT End of Session - 06/18/19 1617    Visit Number  3    Date for PT Re-Evaluation  08/21/19    PT Start Time  1615   late due to work   PT Stop Time  1700    PT Time Calculation (min)  45 min    Activity Tolerance  Patient tolerated treatment well;No increased pain    Behavior During Therapy  WFL for tasks assessed/performed       Past Medical History:  Diagnosis Date  . Asthma    child- occ exersie induced now  . Family history of adverse reaction to anesthesia    dad hard to awaken  . Hypertension    no rx  in 5 yrs fish oil helps now  . Medical history non-contributory    no anesthesia    Past Surgical History:  Procedure Laterality Date  . ANTERIOR CERVICAL DECOMP/DISCECTOMY FUSION N/A 03/30/2018   Procedure: ANTERIOR CERVICAL DECOMPRESSION/DISCECTOMY FUSION Cervical four-five and Cervical five-six;  Surgeon: Coletta Memosabbell, Kyle, MD;  [facility];  Service: Neurosurgery;  Laterality: N/A;  . CERVICAL DISC ARTHROPLASTY N/A 04/20/2016   Procedure: Cervical Six-Seven Artificial Disc Replacement-Cervical;  Surgeon: Tia Alertavid S Jones, MD;  Location: [facility];  Service: Neurosurgery;  Laterality: N/A;    There were no vitals filed for this visit.  Subjective Assessment - 06/18/19 1615    Subjective  My pain decreased. I was able to move around with greater ease. I tripped Monday due to hurrying to work. Iwas able to fold laundry after last visit.    Patient Stated Goals  be able to walk without pain, walk with a limp    Currently in Pain?  Yes    Pain Score  4     Pain Location  Buttocks    Pain Orientation  Right    Pain Descriptors  / Indicators  Constant    Pain Type  Acute pain    Pain Onset  More than a month ago    Pain Frequency  Constant    Aggravating Factors   going from sit to stand and extend hips, sitting on commode and travels into right leg, walking    Pain Relieving Factors  sitting, cycling    Multiple Pain Sites  No                    Pelvic Floor Special Questions - 06/18/19 0001    Pelvic Floor Internal Exam  Patient confirms identification and approves PT to assess muscles and treatment    Exam Type  Rectal    Strength  weak squeeze, no lift   slight lift 3 times       [facility] Adult PT Treatment/Exercise - 06/18/19 0001      Neuro Re-ed    Neuro Re-ed Details   anal contraction with therapist finger in the anal canal giving tactile cues to contract and lift holding 3 seconds 5 times      Lumbar Exercises: Stretches   Piriformis Stretch  Right;1 rep;30 seconds    Piriformis Stretch Limitations  supine with pushing on right knee then LE to the left  Knee/Hip Exercises: Standing   Hip Abduction  Stengthening;Right;1 set;5 reps   but increased right buttock pain   Hip Extension  Stengthening;Right;1 set;5 sets;Knee straight   guidance from therapist   Other Standing Knee Exercises  standing right hip isometric with knee straight and bend against the Wall      Manual Therapy   Manual Therapy  Joint mobilization;Manual Lymphatic Drainage (MLD);Muscle Energy Technique;Internal Pelvic Floor    Joint Mobilization  right hip mobilization for inferior and lateral glide grade 3    Internal Pelvic Floor  soft tissue release to the right levator ani, obturator internist, piriformis, and sacral tuberus ligament    Muscle Energy Technique  to correct right anteriorly rotated ilium               PT Short Term Goals - 06/10/19 1510      PT SHORT TERM GOAL #1   Title  independent with initial HEP    Time  4    Period  Weeks    Status  On-going      PT SHORT TERM GOAL #2    Title  sit with pain decreaesd >/= 25% due to relaxation of muscles    Time  4    Period  Weeks    Status  On-going      PT SHORT TERM GOAL #3   Title  able to walk with pain in right buttocks decreased >/= 25%    Time  4    Period  Weeks    Status  On-going      PT SHORT TERM GOAL #4   Title  understand correct toileting technique to push stool out with greater ease    Time  4    Period  Weeks    Status  New    Target Date  06/26/19        PT Long Term Goals - 05/29/19 1153      PT LONG TERM GOAL #1   Title  Patient is independent with updated HEP    Baseline  --    Time  12    Period  Weeks    Status  New    Target Date  08/21/19      PT LONG TERM GOAL #2   Title  able to ambulate with increase clearance of right foot due to improved pain    Baseline  -    Time  12    Period  Weeks    Status  New    Target Date  08/21/19      PT LONG TERM GOAL #3   Title  able to sit on the commode with 75% less pain due to reduction in pelvic floor trigger points    Baseline  --    Time  12    Period  Weeks    Status  New    Target Date  08/21/19      PT LONG TERM GOAL #4   Title  able to turn in bed with right buttock pain decreased >/= 75%    Baseline  --    Time  12    Period  Weeks    Status  New    Target Date  08/21/19      PT LONG TERM GOAL #5   Title  able to push stool out with >/= 50% greater ease due to pelvic floor strength >/= 3/5    Baseline  --  Time  12    Period  Weeks    Status  New    Target Date  08/21/19            Plan - 06/18/19 1618    Clinical Impression Statement  Patient reports less pain. Patient was able to fold her laundry. Patient had improved circular contraction with lift on 3 pelvic floor contraction due to increased control. Patient was able to walk with increased coordination of right leg and less pain. Patient had trigger points in the right piriformis, levator ani and obturator internist. Patient pelvis was in correct  alignment after therapy. Patient has full movement of right hip. Patient will benefit from skilled therapy to improve strength and coordination and reduce pain to restore function.    Personal Factors and Comorbidities  Comorbidity 1;Past/Current Experience    Comorbidities  ANTERIOR CERVICAL DECOMP/DISCECTOMY FUSION;CERVICAL DISC ARTHROPLASTY    Examination-Activity Limitations  Locomotion Level;Sit;Stand;Toileting;Bed Mobility    Examination-Participation Restrictions  Community Activity;Cleaning    Stability/Clinical Decision Making  Evolving/Moderate complexity    Rehab Potential  Good    PT Frequency  1x / week    PT Duration  12 weeks    PT Treatment/Interventions  Biofeedback;Cryotherapy;Electrical Stimulation;Iontophoresis 4mg /ml Dexamethasone;Moist Heat;Ultrasound;Therapeutic exercise;Therapeutic activities;Gait training;Neuromuscular re-education;Patient/family education;Dry needling;Manual techniques    PT Next Visit Plan  soft tissue work to the pelvic floor,  correct pelvis gently, sacral mobilization, hip isometric, engage the core    PT Home Exercise Plan  Access Code: [email or web address]    Recommended Other Services  MD signed the initial note    Consulted and Agree with Plan of Care  Patient       Patient will benefit from skilled therapeutic intervention in order to improve the following deficits and impairments:  Abnormal gait, Decreased coordination, Increased fascial restricitons, Decreased endurance, Increased muscle spasms, Decreased activity tolerance, Pain, Decreased strength, Decreased mobility  Visit Diagnosis: 1. Acute right-sided low back pain without sciatica   2. Other muscle spasm   3. Muscle weakness (generalized)   4. Other abnormalities of gait and mobility   5. Other lack of coordination        Problem List Patient Active Problem List   Diagnosis Date Noted  . Lumbar facet arthropathy 02/05/2019  . Cervical myelopathy ([facility]) 04/02/2018  . Bilateral arm  weakness   . Numbness and tingling in both hands   . Paresthesia and pain of both upper extremities   . Trauma   . Post-operative pain   . Uncomplicated asthma   . Benign essential HTN   . Bradycardia   . Steroid-induced hyperglycemia   . 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 of cervical spinal cord ([facility]) 03/30/2018  . S/P cervical spinal fusion 04/20/2016  . Cervical radicular pain 03/22/2016  . Hamstring tightness 12/11/2014  . 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 Pain in joint, upper arm 11/17/2014  . Labyrinthitis 05/15/2012  . Sinusitis acute 02/12/2012  . Well adult exam 06/27/2011  . Rash, skin 06/27/2011  . ADHESIVE CAPSULITIS, LEFT 11/09/2009  . SHOULDER PAIN 09/09/2009  . HAIR LOSS 07/24/2008  . ELEVATED BP 07/24/2008  . CARPAL TUNNEL SYNDROME 03/23/2008  . PARESTHESIA 03/23/2008  . MIGRAINE HEADACHE 08/02/2007    Earlie Counts, PT 06/18/19 5:15 PM   [facility] [address], [place], [place], [address] Phone: [phone]   Fax:  [phone]  Name: Kelly [facility] MRN: [id number] Date of Birth: 01/06/1961

## 2019-06-18 NOTE — Telephone Encounter (Signed)
Summary: Appointment  Patient would like to schedule physical appointment with Dr. Alain Marion. Patient was a no show in 2019 for appt but stated that provider came to see her in hospital in 03/2018 best contact number is [phone]   LOV w/you: 2015. Are you ok to see her again?

## 2019-06-18 NOTE — Patient Instructions (Addendum)
Slow Contraction: Gravity Eliminated (Side-Lying)    Lie on left side, hips and knees slightly bent. Slowly squeeze pelvic floor for _3__ seconds. Rest for _5__ seconds. Repeat _5__ times. Do __2_ times a day.   Copyright  VHI. All rights reserved.

## 2019-06-19 NOTE — Telephone Encounter (Signed)
Will have [facility] scheduler contact pt to schedule OV. 

## 2019-06-20 ENCOUNTER — Telehealth: Payer: Self-pay | Admitting: Internal Medicine

## 2019-06-20 NOTE — Telephone Encounter (Signed)
Attempted to reach the office three times, no answer. Patient is calling to schedule CPE.  Please advise [phone]

## 2019-06-20 NOTE — Telephone Encounter (Signed)
Left pt vm in regard
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
## 2019-07-03 ENCOUNTER — Other Ambulatory Visit: Payer: Self-pay

## 2019-07-03 ENCOUNTER — Encounter: Payer: Self-pay | Admitting: Physical Therapy

## 2019-07-03 ENCOUNTER — Ambulatory Visit: Payer: 59 | Attending: Physical Medicine & Rehabilitation | Admitting: Physical Therapy

## 2019-07-03 DIAGNOSIS — R2689 Other abnormalities of gait and mobility: Secondary | ICD-10-CM

## 2019-07-03 DIAGNOSIS — M545 Low back pain, unspecified: Secondary | ICD-10-CM

## 2019-07-03 DIAGNOSIS — M62838 Other muscle spasm: Secondary | ICD-10-CM

## 2019-07-03 DIAGNOSIS — R278 Other lack of coordination: Secondary | ICD-10-CM

## 2019-07-03 DIAGNOSIS — M6281 Muscle weakness (generalized): Secondary | ICD-10-CM | POA: Insufficient documentation

## 2019-07-03 NOTE — Therapy (Signed)
[facility] [address], [address] [place], [place], [address] Phone: [phone]   Fax:  [phone]  Physical Therapy Treatment  Patient Details  Name: Kelly RocheRosario P Wall MRN: [id number] Date of Birth: January 23, 1961 Referring Provider (PT): Dr. Faith RogueZachary Swartz   Encounter Date: 07/03/2019  PT End of Session - 07/03/19 1704    Visit Number  4    Date for PT Re-Evaluation  08/21/19    Authorization Type  UMR    PT Start Time  1615    PT Stop Time  1700    PT Time Calculation (min)  45 min    Activity Tolerance  Patient tolerated treatment well;No increased pain    Behavior During Therapy  WFL for tasks assessed/performed       Past Medical History:  Diagnosis Date  . Asthma    child- occ exersie induced now  . Family history of adverse reaction to anesthesia    dad hard to awaken  . Hypertension    no rx  in 5 yrs fish oil helps now  . Medical history non-contributory    no anesthesia    Past Surgical History:  Procedure Laterality Date  . ANTERIOR CERVICAL DECOMP/DISCECTOMY FUSION N/A 03/30/2018   Procedure: ANTERIOR CERVICAL DECOMPRESSION/DISCECTOMY FUSION Cervical four-five and Cervical five-six;  Surgeon: Coletta Memosabbell, Kyle, MD;  [facility];  Service: Neurosurgery;  Laterality: N/A;  . CERVICAL DISC ARTHROPLASTY N/A 04/20/2016   Procedure: Cervical Six-Seven Artificial Disc Replacement-Cervical;  Surgeon: Tia Alertavid S Jones, MD;  Location: [facility];  Service: Neurosurgery;  Laterality: N/A;    There were no vitals filed for this visit.  Subjective Assessment - 07/03/19 1620    Subjective  I did my bike for 10 miles. I had some increased soreness after last visit and was lighter after a few days. I felt like I could walk better. When I straighted out I am having pain but once I am straight I feel better.    Patient Stated Goals  be able to walk without pain, walk with a limp    Currently in Pain?  Yes    Pain Score  4      Pain Location  Buttocks    Pain Orientation  Right    Pain Descriptors / Indicators  Constant    Pain Type  Acute pain    Pain Onset  More than a month ago    Pain Frequency  Constant    Aggravating Factors   going from sit to stand and extend hips, sitting on commode and travels into right leg, walking    Pain Relieving Factors  sittng, cycling    Multiple Pain Sites  No         [facility] PT Assessment - 07/03/19 0001      Palpation   SI assessment   ilium in correct alignment                   [facility] Adult PT Treatment/Exercise - 07/03/19 0001      Self-Care   Self-Care  Other Self-Care Comments    Other Self-Care Comments   using a a foam roll to massage the pelvic floor muscles in sitting      Lumbar Exercises: Seated   Other Seated Lumbar Exercises  seated bring hip up and out and back 10 times      Lumbar Exercises: Supine   Heel Slides  10 reps   on left in midrange  Heel Slides Limitations  VC to not let knee fall out      Knee/Hip Exercises: Standing   Other Standing Knee Exercises  stand and write the ABC with right leg      Knee/Hip Exercises: Seated   Other Seated Knee/Hip Exercises  sitting and roll ball back and forth 20x then bring knee and foot side to side with ball under foot      Manual Therapy   Manual Therapy  Soft tissue mobilization    Soft tissue mobilization  right obturator internist and levator ani             PT Education - 07/03/19 1703    Education Details  Access Code: [email or web address]    Person(s) Educated  Patient    Methods  Explanation;Demonstration;Verbal cues;Handout    Comprehension  Verbalized understanding;Returned demonstration       PT Short Term Goals - 07/03/19 1626      PT SHORT TERM GOAL #1   Title  independent with initial HEP    Time  4    Period  Weeks    Status  Achieved    Target Date  06/26/19      PT SHORT TERM GOAL #2   Title  sit with pain decreaesd >/= 25% due to relaxation of muscles     Time  4    Period  Weeks    Status  Achieved    Target Date  06/26/19      PT SHORT TERM GOAL #3   Title  able to walk with pain in right buttocks decreased >/= 25%    Time  4    Period  Weeks    Status  Achieved        PT Long Term Goals - 05/29/19 1153      PT LONG TERM GOAL #1   Title  Patient is independent with updated HEP    Baseline  --    Time  12    Period  Weeks    Status  New    Target Date  08/21/19      PT LONG TERM GOAL #2   Title  able to ambulate with increase clearance of right foot due to improved pain    Baseline  -    Time  12    Period  Weeks    Status  New    Target Date  08/21/19      PT LONG TERM GOAL #3   Title  able to sit on the commode with 75% less pain due to reduction in pelvic floor trigger points    Baseline  --    Time  12    Period  Weeks    Status  New    Target Date  08/21/19      PT LONG TERM GOAL #4   Title  able to turn in bed with right buttock pain decreased >/= 75%    Baseline  --    Time  12    Period  Weeks    Status  New    Target Date  08/21/19      PT LONG TERM GOAL #5   Title  able to push stool out with >/= 50% greater ease due to pelvic floor strength >/= 3/5    Baseline  --    Time  12    Period  Weeks    Status  New    Target Date  08/21/19  Plan - 07/03/19 1624    Clinical Impression Statement  Patient has walks with decreased hip hinge with trunk going backward. Patient has 25% less pain. Patient is having more feeling in her right foot. Patient pelvis in correct alignment. Patient has difficulty performing heel slides on the right due to lack of control and weakness. Patient has to help her right leg into the car so she is working on hip flexion in sitting. Patient did not need as much soft tissue work due to less trigger points in her muscles. Patient will benefit from skilled therapy to improve strength and coordination and reduce pain to restore function.    Personal Factors and  Comorbidities  Comorbidity 1;Past/Current Experience    Comorbidities  ANTERIOR CERVICAL DECOMP/DISCECTOMY FUSION;CERVICAL DISC ARTHROPLASTY    Examination-Activity Limitations  Locomotion Level;Sit;Stand;Toileting;Bed Mobility    Examination-Participation Restrictions  Community Activity;Cleaning    Stability/Clinical Decision Making  Evolving/Moderate complexity    Rehab Potential  Good    PT Frequency  1x / week    PT Duration  12 weeks    PT Treatment/Interventions  Biofeedback;Cryotherapy;Electrical Stimulation;Iontophoresis 4mg /ml Dexamethasone;Moist Heat;Ultrasound;Therapeutic exercise;Therapeutic activities;Gait training;Neuromuscular re-education;Patient/family education;Dry needling;Manual techniques    PT Next Visit Plan  soft tissue work to the pelvic floor,  hip isometric, engage the core    PT Home Exercise Plan  Access Code: [email or web address]    Consulted and Agree with Plan of Care  Patient       Patient will benefit from skilled therapeutic intervention in order to improve the following deficits and impairments:  Abnormal gait, Decreased coordination, Increased fascial restricitons, Decreased endurance, Increased muscle spasms, Decreased activity tolerance, Pain, Decreased strength, Decreased mobility  Visit Diagnosis: 1. Acute right-sided low back pain without sciatica   2. Other muscle spasm   3. Muscle weakness (generalized)   4. Other abnormalities of gait and mobility   5. Other lack of coordination        Problem List Patient Active Problem List   Diagnosis Date Noted  . Lumbar facet arthropathy 02/05/2019  . Cervical myelopathy ([place]) 04/02/2018  . Bilateral arm weakness   . Numbness and tingling in both hands   . Paresthesia and pain of both upper extremities   . Trauma   . Post-operative pain   . Uncomplicated asthma   . Benign essential HTN   . Bradycardia   . Steroid-induced hyperglycemia   . 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 of cervical spinal cord ([place])  03/30/2018  . S/P cervical spinal fusion 04/20/2016  . Cervical radicular pain 03/22/2016  . Hamstring tightness 12/11/2014  . 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 Pain in joint, upper arm 11/17/2014  . Labyrinthitis 05/15/2012  . Sinusitis acute 02/12/2012  . Well adult exam 06/27/2011  . Rash, skin 06/27/2011  . ADHESIVE CAPSULITIS, LEFT 11/09/2009  . SHOULDER PAIN 09/09/2009  . HAIR LOSS 07/24/2008  . ELEVATED BP 07/24/2008  . CARPAL TUNNEL SYNDROME 03/23/2008  . PARESTHESIA 03/23/2008  . MIGRAINE HEADACHE 08/02/2007    Earlie Counts, PT 07/03/19 5:09 PM   [facility] [address] [place], [place], [address] Phone: [phone]   Fax:  [phone]  Name: Kelly Wall MRN: [id number] Date of Birth: [birth year]

## 2019-07-03 NOTE — Patient Instructions (Signed)
Access Code: [email or web address]  URL: [email or web address]  Date: 07/03/2019  Prepared by: Earlie Counts   Exercises Bent Knee Fallouts - 5 reps - 1 sets - 1x daily - 7x weekly Supine Figure 4 Piriformis Stretch - 2 reps - 1 sets - 15 sec hold - 1x daily - 7x weekly Standing Isometric Hip Abduction with Ball on Wall - 5 reps - 1 sets - 5 sec hold - 1x daily - 7x weekly Supine Heel Slide with Small Ball - 10 reps - 1 sets - 2x daily - 7x weekly [facility] [address] [place], [place] [address] Phone # [phone] Fax [phone]

## 2019-07-07 MED FILL — PREGABALIN 150 MG CAPS: 150 | 30 days supply | Qty: 60 | Fill #1

## 2019-07-07 MED FILL — DICLOFENAC SOD EC 50 MG TAB: 50 | 30 days supply | Qty: 60 | Fill #1

## 2019-07-10 ENCOUNTER — Other Ambulatory Visit: Payer: Self-pay

## 2019-07-10 ENCOUNTER — Encounter: Payer: Self-pay | Admitting: Physical Therapy

## 2019-07-10 ENCOUNTER — Ambulatory Visit: Payer: 59 | Admitting: Physical Therapy

## 2019-07-10 DIAGNOSIS — R2689 Other abnormalities of gait and mobility: Secondary | ICD-10-CM

## 2019-07-10 DIAGNOSIS — M62838 Other muscle spasm: Secondary | ICD-10-CM

## 2019-07-10 DIAGNOSIS — R278 Other lack of coordination: Secondary | ICD-10-CM | POA: Diagnosis not present

## 2019-07-10 DIAGNOSIS — M6281 Muscle weakness (generalized): Secondary | ICD-10-CM

## 2019-07-10 DIAGNOSIS — M545 Low back pain, unspecified: Secondary | ICD-10-CM

## 2019-07-10 NOTE — Therapy (Signed)
[facility] [address], [place], [place], [address] Phone: [phone]   Fax:  [phone]  Physical Therapy Treatment  Patient Details  Name: Kelly Wall MRN: [id number] Date of Birth: [birth year] Referring Provider (PT): Dr. Alger Simons   Encounter Date: 07/10/2019  PT End of Session - 07/10/19 1627    Visit Number  5    Date for PT Re-Evaluation  08/21/19    Authorization Type  UMR    PT Start Time  1622   came late   PT Stop Time  1700    PT Time Calculation (min)  38 min    Activity Tolerance  Patient tolerated treatment well;No increased pain    Behavior During Therapy  WFL for tasks assessed/performed       Past Medical History:  Diagnosis Date  . Asthma    child- occ exersie induced now  . Family history of adverse reaction to anesthesia    dad hard to awaken  . Hypertension    no rx  in 5 yrs fish oil helps now  . Medical history non-contributory    no anesthesia    Past Surgical History:  Procedure Laterality Date  . ANTERIOR CERVICAL DECOMP/DISCECTOMY FUSION N/A 03/30/2018   Procedure: ANTERIOR CERVICAL DECOMPRESSION/DISCECTOMY FUSION Cervical four-five and Cervical five-six;  Surgeon: Ashok Pall, MD;  Location: [place];  Service: Neurosurgery;  Laterality: N/A;  . CERVICAL DISC ARTHROPLASTY N/A 04/20/2016   Procedure: Cervical Six-Seven Artificial Disc Replacement-Cervical;  Surgeon: Eustace Moore, MD;  Location: [place] [facility];  Service: Neurosurgery;  Laterality: N/A;    There were no vitals filed for this visit.  Subjective Assessment - 07/10/19 1623    Subjective  I walked 1.5 mile slowly and I am stiff from that. The other day I walked 2 miles. Last night my right hip pain was bad.    Patient Stated Goals  be able to walk without pain, walk with a limp    Currently in Pain?  Yes    Pain Score  5     Pain Location  Buttocks    Pain Orientation  Right    Pain Descriptors /  Indicators  Constant    Pain Type  Acute pain    Pain Onset  More than a month ago    Pain Frequency  Constant    Aggravating Factors   going from sit to stand and extend hips, sitting on commode and travels into right leg, walking    Pain Relieving Factors  sitting, cycling    Multiple Pain Sites  No         [facility] PT Assessment - 07/10/19 0001      Palpation   SI assessment   right ilium anteriorly rotated                   [facility] Adult PT Treatment/Exercise - 07/10/19 0001      Ambulation/Gait   Gait Comments  walked with patient 50 feet to her car working on leg coordination and contact guard      Lumbar Exercises: Supine   Other Supine Lumbar Exercises  supine pelvic tile and circles both ways with therapist assissting patient      Lumbar Exercises: Sidelying   Clam  Right;15 reps;1 second   2 sets   Clam Limitations  therapist asssting for control    Other Sidelying Lumbar Exercises  right ilium diagonals and up and down  Other Sidelying Lumbar Exercises  right sidely with right knee and hip flexion on mat 10x2      Manual Therapy   Manual Therapy  Muscle Energy Technique    Joint Mobilization  joint mobilization to the sacrum in hookly with therapist hand on sacrum and rotate knees to the right     Muscle Energy Technique  to correct right anteriorly rotated ilium               PT Short Term Goals - 07/03/19 1626      PT SHORT TERM GOAL #1   Title  independent with initial HEP    Time  4    Period  Weeks    Status  Achieved    Target Date  06/26/19      PT SHORT TERM GOAL #2   Title  sit with pain decreaesd >/= 25% due to relaxation of muscles    Time  4    Period  Weeks    Status  Achieved    Target Date  06/26/19      PT SHORT TERM GOAL #3   Title  able to walk with pain in right buttocks decreased >/= 25%    Time  4    Period  Weeks    Status  Achieved        PT Long Term Goals - 05/29/19 1153      PT LONG TERM GOAL #1    Title  Patient is independent with updated HEP    Baseline  --    Time  12    Period  Weeks    Status  New    Target Date  08/21/19      PT LONG TERM GOAL #2   Title  able to ambulate with increase clearance of right foot due to improved pain    Baseline  -    Time  12    Period  Weeks    Status  New    Target Date  08/21/19      PT LONG TERM GOAL #3   Title  able to sit on the commode with 75% less pain due to reduction in pelvic floor trigger points    Baseline  --    Time  12    Period  Weeks    Status  New    Target Date  08/21/19      PT LONG TERM GOAL #4   Title  able to turn in bed with right buttock pain decreased >/= 75%    Baseline  --    Time  12    Period  Weeks    Status  New    Target Date  08/21/19      PT LONG TERM GOAL #5   Title  able to push stool out with >/= 50% greater ease due to pelvic floor strength >/= 3/5    Baseline  --    Time  12    Period  Weeks    Status  New    Target Date  08/21/19            Plan - 07/10/19 1627    Clinical Impression Statement  Patient pelvis was in correct alignment after therapy. Patient was working on pelvic coordination in therapy and needs tactile assistance to perform. Patient is able to perform a right heel slide without assistance now. Patient is able to perform knee and hip flexion in right sidely  by herself now. Patient was fatiqued after therapy so she needed CG with walking to her car. Patient will benefit from skilled therapy to improve strength and coordination and reduce pain to restore function.    Personal Factors and Comorbidities  Comorbidity 1;Past/Current Experience    Comorbidities  ANTERIOR CERVICAL DECOMP/DISCECTOMY FUSION;CERVICAL DISC ARTHROPLASTY    Examination-Activity Limitations  Locomotion Level;Sit;Stand;Toileting;Bed Mobility    Examination-Participation Restrictions  Community Activity;Cleaning    Stability/Clinical Decision Making  Evolving/Moderate complexity    Rehab  Potential  Good    PT Frequency  1x / week    PT Duration  12 weeks    PT Treatment/Interventions  Biofeedback;Cryotherapy;Electrical Stimulation;Iontophoresis 4mg /ml Dexamethasone;Moist Heat;Ultrasound;Therapeutic exercise;Therapeutic activities;Gait training;Neuromuscular re-education;Patient/family education;Dry needling;Manual techniques    PT Next Visit Plan  work on pelvic mobility, sidely hip strength, clam, leg circles, ask about toileting    PT Home Exercise Plan  Access Code: [email or web address]    Consulted and Agree with Plan of Care  Patient       Patient will benefit from skilled therapeutic intervention in order to improve the following deficits and impairments:  Abnormal gait, Decreased coordination, Increased fascial restricitons, Decreased endurance, Increased muscle spasms, Decreased activity tolerance, Pain, Decreased strength, Decreased mobility  Visit Diagnosis: 1. Acute right-sided low back pain without sciatica   2. Other muscle spasm   3. Muscle weakness (generalized)   4. Other abnormalities of gait and mobility   5. Other lack of coordination        Problem List Patient Active Problem List   Diagnosis Date Noted  . Lumbar facet arthropathy 02/05/2019  . Cervical myelopathy ([facility]) 04/02/2018  . Bilateral arm weakness   . Numbness and tingling in both hands   . Paresthesia and pain of both upper extremities   . Trauma   . Post-operative pain   . Uncomplicated asthma   . Benign essential HTN   . Bradycardia   . Steroid-induced hyperglycemia   . 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 of cervical spinal cord ([facility]) 03/30/2018  . S/P cervical spinal fusion 04/20/2016  . Cervical radicular pain 03/22/2016  . Hamstring tightness 12/11/2014  . 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 Pain in joint, upper arm 11/17/2014  . Labyrinthitis 05/15/2012  . Sinusitis acute 02/12/2012  . Well adult exam 06/27/2011  . Rash, skin 06/27/2011  . ADHESIVE CAPSULITIS, LEFT 11/09/2009  . SHOULDER  PAIN 09/09/2009  . HAIR LOSS 07/24/2008  . ELEVATED BP 07/24/2008  . CARPAL TUNNEL SYNDROME 03/23/2008  . PARESTHESIA 03/23/2008  . MIGRAINE HEADACHE 08/02/2007    Eulis Fosterheryl Krishika Bugge, PT 07/10/19 5:08 PM   [facility] [address], [address] [place], [place], [address] Phone: [phone]   Fax:  [phone]  Name: Kelly Wall MRN: [id number] Date of Birth: December 23, 1960

## 2019-07-11 MED FILL — TRETINOIN 0.025% CREAM: 0.025 | 90 days supply | Qty: 45 | Fill #0

## 2019-07-16 ENCOUNTER — Encounter: Payer: Self-pay | Admitting: Physical Therapy

## 2019-07-16 ENCOUNTER — Ambulatory Visit: Payer: 59 | Admitting: Physical Therapy

## 2019-07-16 ENCOUNTER — Other Ambulatory Visit: Payer: Self-pay

## 2019-07-16 DIAGNOSIS — M6281 Muscle weakness (generalized): Secondary | ICD-10-CM | POA: Diagnosis not present

## 2019-07-16 DIAGNOSIS — R278 Other lack of coordination: Secondary | ICD-10-CM | POA: Diagnosis not present

## 2019-07-16 DIAGNOSIS — M62838 Other muscle spasm: Secondary | ICD-10-CM | POA: Diagnosis not present

## 2019-07-16 DIAGNOSIS — M545 Low back pain, unspecified: Secondary | ICD-10-CM

## 2019-07-16 DIAGNOSIS — R2689 Other abnormalities of gait and mobility: Secondary | ICD-10-CM | POA: Diagnosis not present
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
## 2019-07-24 ENCOUNTER — Ambulatory Visit: Payer: 59 | Admitting: Physical Therapy

## 2019-07-24 ENCOUNTER — Encounter: Payer: Self-pay | Admitting: Physical Therapy

## 2019-07-24 ENCOUNTER — Other Ambulatory Visit: Payer: Self-pay

## 2019-07-24 DIAGNOSIS — M6281 Muscle weakness (generalized): Secondary | ICD-10-CM | POA: Diagnosis not present

## 2019-07-24 DIAGNOSIS — R278 Other lack of coordination: Secondary | ICD-10-CM | POA: Diagnosis not present

## 2019-07-24 DIAGNOSIS — M545 Low back pain, unspecified: Secondary | ICD-10-CM

## 2019-07-24 DIAGNOSIS — R2689 Other abnormalities of gait and mobility: Secondary | ICD-10-CM | POA: Diagnosis not present

## 2019-07-24 DIAGNOSIS — M62838 Other muscle spasm: Secondary | ICD-10-CM | POA: Diagnosis not present
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
## 2019-08-05 MED FILL — DICLOFENAC SOD EC 50 MG TAB: 50 | 30 days supply | Qty: 60 | Fill #2

## 2019-08-05 MED FILL — PREGABALIN 150 MG CAPS: 150 | 30 days supply | Qty: 60 | Fill #2

## 2019-08-05 MED FILL — CLINDAMYCIN PHOS-BENZOYL PE: 1-5 | 30 days supply | Qty: 50 | Fill #0

## 2019-08-06 ENCOUNTER — Encounter: Payer: 59 | Attending: Physical Medicine & Rehabilitation | Admitting: Physical Medicine & Rehabilitation

## 2019-08-06 ENCOUNTER — Other Ambulatory Visit: Payer: Self-pay

## 2019-08-06 ENCOUNTER — Encounter

## 2019-08-06 ENCOUNTER — Encounter: Payer: Self-pay | Admitting: Physical Medicine & Rehabilitation

## 2019-08-06 VITALS — BP 147/88 | HR 69 | Temp 97.7°F | Ht 61.0 in | Wt 116.0 lb

## 2019-08-06 DIAGNOSIS — S14125S 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 of cervical spinal cord, sequela: Secondary | ICD-10-CM | POA: Insufficient documentation

## 2019-08-06 DIAGNOSIS — G959 Disease of spinal cord, unspecified: Secondary | ICD-10-CM

## 2019-08-06 DIAGNOSIS — M5136 Other intervertebral disc degeneration, lumbar region: Secondary | ICD-10-CM | POA: Diagnosis not present

## 2019-08-06 DIAGNOSIS — M47816 Spondylosis without myelopathy or radiculopathy, lumbar region: Secondary | ICD-10-CM | POA: Diagnosis not present

## 2019-08-06 MED ORDER — DICLOFENAC SODIUM 75 MG PO TBEC: 75.0000 mg | DELAYED_RELEASE_TABLET | Freq: Two times a day (BID) | ORAL | 3 refills | Status: DC

## 2019-08-06 MED FILL — DICLOFENAC SODIUM 75 MG TAB: 75 | 30 days supply | Qty: 60 | Fill #0

## 2019-08-06 NOTE — Patient Instructions (Signed)
CONTINUE TO WORK ON DAILY STRETCHING!!!
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
## 2019-08-07 MED FILL — DROSPIR-ETH ESTRA 3/.02 MG: 3-0.02 | 84 days supply | Qty: 84 | Fill #0

## 2019-08-24 ENCOUNTER — Ambulatory Visit
Admission: RE | Admit: 2019-08-24 | Discharge: 2019-08-24 | Disposition: A | Discharge status: Home / Self Care | Payer: 59 | Source: Ambulatory Visit | Attending: Physical Medicine & Rehabilitation | Admitting: Physical Medicine & Rehabilitation

## 2019-08-24 ENCOUNTER — Other Ambulatory Visit: Payer: Self-pay

## 2019-08-24 DIAGNOSIS — M47816 Spondylosis without myelopathy or radiculopathy, lumbar region: Secondary | ICD-10-CM

## 2019-08-24 DIAGNOSIS — G959 Disease of spinal cord, unspecified: Secondary | ICD-10-CM

## 2019-08-24 DIAGNOSIS — M48061 Spinal stenosis, lumbar region without neurogenic claudication: Secondary | ICD-10-CM | POA: Diagnosis not present

## 2019-08-24 DIAGNOSIS — M5136 Other intervertebral disc degeneration, lumbar region: Secondary | ICD-10-CM

## 2019-08-24 DIAGNOSIS — M5126 Other intervertebral disc displacement, lumbar region: Secondary | ICD-10-CM | POA: Diagnosis not present

## 2019-08-26 ENCOUNTER — Telehealth: Payer: Self-pay

## 2019-08-26 NOTE — Telephone Encounter (Signed)
Patient called requesting results of recent MRI

## 2019-08-28 NOTE — Telephone Encounter (Addendum)
April, can you set up Kelly Wall for right-sided L4-5, L5-S1 transforaminal ESI's with Dr. Letta Pate at his next available?   She will also need left-sided injections too. Dr. Raliegh Ip can decide upon timing when he sees her.   Thanks!

## 2019-09-03 MED FILL — LORYNA 3-0.02 MG TABS: 3-0.02 | 84 days supply | Qty: 84 | Fill #0

## 2019-09-03 MED FILL — DICLOFENAC SODIUM 75 MG TAB: 75 | 30 days supply | Qty: 60 | Fill #1

## 2019-09-05 ENCOUNTER — Encounter: Payer: Self-pay | Admitting: Physical Medicine & Rehabilitation

## 2019-09-05 ENCOUNTER — Encounter: Payer: 59 | Attending: Physical Medicine & Rehabilitation | Admitting: Physical Medicine & Rehabilitation

## 2019-09-05 ENCOUNTER — Other Ambulatory Visit: Payer: Self-pay

## 2019-09-05 VITALS — BP 156/88 | HR 67 | Temp 97.5°F | Ht 61.0 in | Wt 114.0 lb

## 2019-09-05 DIAGNOSIS — S14125S 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 of cervical spinal cord, sequela: Secondary | ICD-10-CM | POA: Diagnosis not present

## 2019-09-05 DIAGNOSIS — M47816 Spondylosis without myelopathy or radiculopathy, lumbar region: Secondary | ICD-10-CM | POA: Insufficient documentation

## 2019-09-05 DIAGNOSIS — M5416 Radiculopathy, lumbar region: Secondary | ICD-10-CM

## 2019-09-05 NOTE — Patient Instructions (Signed)

## 2019-09-05 NOTE — Progress Notes (Signed)
  PROCEDURE RECORD [facility]   Name: Kelly Wall DOB:1961/05/25 MRN: [id number]  Date:09/05/2019  Physician: Alysia Penna, MD    Nurse/CMA: Santiago Graf CMA  Allergies:  Allergies  Allergen Reactions  . Butorphanol Tartrate Shortness Of Breath  . Tramadol Shortness Of Breath  . Flexeril [Cyclobenzaprine] Other (See Comments)    Knocks her out  . Hydrochlorothiazide W-Triamterene Other (See Comments)    REACTION: dizziness  . Propranolol Hcl Other (See Comments)    Chest pain  . Tetracycline Hcl Other (See Comments)    Bleeding in throat    Consent Signed: Yes.    Is patient diabetic? No.  CBG today? NA  Pregnant: No. LMP: No LMP recorded. Patient is postmenopausal. (age 70-55)  Anticoagulants: no Anti-inflammatory: no Antibiotics: no  Procedure: Right L4-5, L5-S1 Transforminal Epidural Steroid Injection Position: Prone   Start Time: 107pm End Time: 115pm Fluoro Time: 42s  RN/CMA Inette Doubrava CMA Briana Newman CMA    Time 1237pm 128pm    BP 156/88 168/92    Pulse 67 60    Respirations 16 16    O2 Sat 99 99    S/S 6 6    Pain Level 4/10 5/10     D/C home with Margaretha Seeds, patient A & O X 3, D/C instructions reviewed, and sits independently.

## 2019-09-05 NOTE — Progress Notes (Signed)
RIght L4-5 and RIght L5-S1 Lumbar transforaminal epidural steroid injection under fluoroscopic guidance with contrast enhancement  Indication: Lumbosacral radiculitis is not relieved by medication management or other conservative care and interfering with self-care and mobility.   Informed consent was obtained after describing risk and benefits of the procedure with the patient, this includes bleeding, bruising, infection, paralysis and medication side effects.  The patient wishes to proceed and has given written consent.  Patient was placed in prone position.  The lumbar area was marked and prepped with Betadine.  It was entered with a 25-gauge 1-1/2 inch needle and one mL of 1% lidocaine was injected into the skin and subcutaneous tissue.  Then a 22-gauge 3.5 in spinal needle was inserted into the RIght L5-S1 intervertebral foramen under AP, lateral, and oblique view.  Once needle tip was within the foramen on lateral views an dnor exceeding 6 o clock position on th epedical on AP viewed Isovue 200 was inected x 72ml Then a solution containing one mL of 10 mg per mL dexamethasone and 2 mL of 1% lidocaine was injected.  The patient tolerated procedure well. THis same procedure was performed at Right L4-L5 with same technique, equipment and medication  Post procedure instructions were given.  Please see post procedure form.

## 2019-09-09 ENCOUNTER — Other Ambulatory Visit: Payer: Self-pay | Admitting: Physical Medicine & Rehabilitation

## 2019-09-09 DIAGNOSIS — M79601 Pain in right arm: Secondary | ICD-10-CM

## 2019-09-09 DIAGNOSIS — R209 Unspecified disturbances of skin sensation: Secondary | ICD-10-CM

## 2019-09-09 DIAGNOSIS — S14125S 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 of cervical spinal cord, sequela: Secondary | ICD-10-CM

## 2019-09-09 MED FILL — PREGABALIN 100 MG CAPS: 100 | 30 days supply | Qty: 60 | Fill #0

## 2019-09-09 NOTE — Telephone Encounter (Signed)
rx written. thx 

## 2019-09-09 NOTE — Telephone Encounter (Signed)
Recieved electronic medication request from pharmacy for lyrica 100mg  Bid.  Attempted to call and make verbal order and they stated needed to be E-scribed from Doctor.

## 2019-09-18 ENCOUNTER — Ambulatory Visit: Payer: 59 | Admitting: Registered Nurse

## 2019-10-03 MED FILL — DICLOFENAC SODIUM 75 MG TAB: 75 | 30 days supply | Qty: 60 | Fill #2

## 2019-10-07 MED FILL — PREGABALIN 150 MG CAPS: 150 | 30 days supply | Qty: 60 | Fill #3

## 2019-10-10 MED FILL — PREGABALIN 100 MG CAPS: 100 | 30 days supply | Qty: 60 | Fill #1

## 2019-10-15 ENCOUNTER — Encounter: Payer: 59 | Attending: Physical Medicine & Rehabilitation | Admitting: Physical Medicine & Rehabilitation

## 2019-10-15 ENCOUNTER — Other Ambulatory Visit: Payer: Self-pay

## 2019-10-15 ENCOUNTER — Encounter: Payer: Self-pay | Admitting: Physical Medicine & Rehabilitation

## 2019-10-15 VITALS — BP 116/75 | HR 64 | Temp 97.5°F | Ht 61.0 in | Wt 111.8 lb

## 2019-10-15 DIAGNOSIS — M5416 Radiculopathy, lumbar region: Secondary | ICD-10-CM | POA: Insufficient documentation

## 2019-10-15 DIAGNOSIS — M47816 Spondylosis without myelopathy or radiculopathy, lumbar region: Secondary | ICD-10-CM | POA: Insufficient documentation

## 2019-10-15 DIAGNOSIS — S14125S 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 of cervical spinal cord, sequela: Secondary | ICD-10-CM | POA: Insufficient documentation

## 2019-10-15 DIAGNOSIS — S14125D 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 of cervical spinal cord, subsequent encounter: Secondary | ICD-10-CM | POA: Diagnosis not present
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
## 2019-10-15 NOTE — Patient Instructions (Signed)
PLEASE FEEL FREE TO CALL OUR OFFICE WITH ANY PROBLEMS OR QUESTIONS ([phone])      

## 2019-11-05 MED FILL — TRETINOIN 0.025% CREAM: 0.025 | 90 days supply | Qty: 45 | Fill #1

## 2019-11-05 MED FILL — DICLOFENAC SODIUM 75 MG TAB: 75 | 30 days supply | Qty: 60 | Fill #3

## 2019-11-07 MED FILL — PREGABALIN 100 MG CAPS: 100 | 30 days supply | Qty: 60 | Fill #2

## 2019-11-13 ENCOUNTER — Encounter: Payer: Self-pay | Admitting: Physical Medicine & Rehabilitation

## 2019-11-13 ENCOUNTER — Encounter: Payer: 59 | Attending: Physical Medicine & Rehabilitation | Admitting: Physical Medicine & Rehabilitation

## 2019-11-13 ENCOUNTER — Other Ambulatory Visit: Payer: Self-pay

## 2019-11-13 VITALS — BP 142/84 | HR 65 | Temp 97.5°F | Ht 61.0 in | Wt 114.0 lb

## 2019-11-13 DIAGNOSIS — M5416 Radiculopathy, lumbar region: Secondary | ICD-10-CM | POA: Diagnosis not present

## 2019-11-13 DIAGNOSIS — M47816 Spondylosis without myelopathy or radiculopathy, lumbar region: Secondary | ICD-10-CM | POA: Insufficient documentation

## 2019-11-13 DIAGNOSIS — S14125S 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 of cervical spinal cord, sequela: Secondary | ICD-10-CM | POA: Insufficient documentation

## 2019-11-13 NOTE — Progress Notes (Signed)
  PROCEDURE RECORD [facility]   Name: ANEIRA CAVITT DOB:22-May-1961 MRN: [id number]  Date:11/13/2019  Physician: Alysia Penna, MD    Nurse/CMA: Tanner Yeley, CMA  Allergies:  Allergies  Allergen Reactions  . Butorphanol Tartrate Shortness Of Breath  . Tramadol Shortness Of Breath  . Flexeril [Cyclobenzaprine] Other (See Comments)    Knocks her out  . Hydrochlorothiazide W-Triamterene Other (See Comments)    REACTION: dizziness  . Propranolol Hcl Other (See Comments)    Chest pain  . Tetracycline Hcl Other (See Comments)    Bleeding in throat    Consent Signed: Yes.    Is patient diabetic? No.  CBG today?   Pregnant: No. LMP: No LMP recorded. Patient is postmenopausal. (age 58-55)  Anticoagulants: no Anti-inflammatory: no Antibiotics: no  Procedure: Right L4-5 translaminar epidural steroid injecion  Position: Prone Start Time: 10:30am  End Time: 10:36am Fluoro Time: 23s  RN/CMA Anjani Feuerborn, CMA Tovia Kisner, CMA    Time 10:40am 10:47am    BP 142/84 159/94    Pulse 65 69    Respirations 14 14    O2 Sat 99 98    S/S 6 6    Pain Level 6/10 6/10     D/C home with friend, patient A & O X 3, D/C instructions reviewed, and sits independently.

## 2019-11-13 NOTE — Progress Notes (Signed)
Right L4-5 Lumbar translaminar epidural steroid injection under fluoroscopic guidance  Indication: Lumbosacral radiculitis is not relieved by medication management or other conservative care and interfering with self-care and mobility.  No  anticoagulant use.  Informed consent was obtained after describing risk and benefits of the procedure with the patient, this includes bleeding, bruising, infection, paralysis and medication side effects.  The patient wishes to proceed and has given written consent.  Patient was placed in a prone position.  The lumbar area was marked and prepped with Betadine.  It was entered with a 25-gauge 1-1/2 inch needle and one mL of 1% lidocaine was injected into the skin and subcutaneous tissue.  Then a 17-gauge spinal needle was inserted under fluoroscopic guidance into the L4-5 right paramedian  interlaminar space under AP and Lateral imaging.  Once needle tip of approximated the posterior elements, a loss of resistance technique was utilized with lateral imaging.  A positive loss of resistance was obtained and then confirmed by injecting 2 mL's of Omnipaque 180.  Then a solution containing 1.5 mL's of 6mg /ml Celestone and 1.5 mL's of 1% lidocaine was injected.  The patient tolerated procedure well.  Post procedure instructions were given.  Please see post procedure form.

## 2019-11-14 ENCOUNTER — Ambulatory Visit: Payer: 59 | Admitting: Physical Medicine & Rehabilitation

## 2019-12-04 ENCOUNTER — Other Ambulatory Visit: Payer: Self-pay | Admitting: Physical Medicine & Rehabilitation

## 2019-12-04 DIAGNOSIS — M5136 Other intervertebral disc degeneration, lumbar region: Secondary | ICD-10-CM

## 2019-12-04 DIAGNOSIS — M47816 Spondylosis without myelopathy or radiculopathy, lumbar region: Secondary | ICD-10-CM

## 2019-12-04 DIAGNOSIS — G959 Disease of spinal cord, unspecified: Secondary | ICD-10-CM

## 2019-12-04 MED FILL — DICLOFENAC SODIUM 75 MG TAB: 75 | 90 days supply | Qty: 60 | Fill #0

## 2019-12-05 MED FILL — PREGABALIN 100 MG CAPS: 100 | 30 days supply | Qty: 60 | Fill #3

## 2019-12-10 ENCOUNTER — Encounter: Payer: 59 | Attending: Physical Medicine & Rehabilitation | Admitting: Physical Medicine & Rehabilitation

## 2019-12-10 DIAGNOSIS — M47816 Spondylosis without myelopathy or radiculopathy, lumbar region: Secondary | ICD-10-CM | POA: Insufficient documentation

## 2019-12-10 DIAGNOSIS — S14125S 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 of cervical spinal cord, sequela: Secondary | ICD-10-CM | POA: Insufficient documentation

## 2019-12-31 ENCOUNTER — Encounter: Payer: 59 | Attending: Physical Medicine & Rehabilitation | Admitting: Physical Medicine & Rehabilitation

## 2019-12-31 ENCOUNTER — Other Ambulatory Visit: Payer: Self-pay

## 2019-12-31 ENCOUNTER — Encounter: Payer: Self-pay | Admitting: Physical Medicine & Rehabilitation

## 2019-12-31 VITALS — BP 153/98 | HR 70 | Temp 97.7°F | Ht 62.0 in | Wt 115.0 lb

## 2019-12-31 DIAGNOSIS — S14125S 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 of cervical spinal cord, sequela: Secondary | ICD-10-CM | POA: Diagnosis not present

## 2019-12-31 DIAGNOSIS — M47816 Spondylosis without myelopathy or radiculopathy, lumbar region: Secondary | ICD-10-CM

## 2019-12-31 DIAGNOSIS — M5416 Radiculopathy, lumbar region: Secondary | ICD-10-CM | POA: Diagnosis not present

## 2019-12-31 NOTE — Patient Instructions (Signed)
PLEASE FEEL FREE TO CALL OUR OFFICE WITH ANY PROBLEMS OR QUESTIONS ([phone])      
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
## 2020-01-02 MED FILL — CLINDAMYCIN PHOS-BENZOYL PE: 1-5 | 30 days supply | Qty: 50 | Fill #1

## 2020-01-02 MED FILL — DICLOFENAC SOD EC 75 MG TAB: 75 | 30 days supply | Qty: 60 | Fill #1

## 2020-01-05 MED FILL — PREGABALIN 100 MG CAPS: 100 | 30 days supply | Qty: 60 | Fill #4

## 2020-01-06 DIAGNOSIS — M4316 Spondylolisthesis, lumbar region: Secondary | ICD-10-CM | POA: Diagnosis not present

## 2020-01-06 DIAGNOSIS — M48062 Spinal stenosis, lumbar region with neurogenic claudication: Secondary | ICD-10-CM | POA: Diagnosis not present

## 2020-01-14 ENCOUNTER — Other Ambulatory Visit: Payer: Self-pay | Admitting: Neurosurgery

## 2020-01-22 DIAGNOSIS — M5412 Radiculopathy, cervical region: Secondary | ICD-10-CM | POA: Diagnosis not present

## 2020-01-23 ENCOUNTER — Ambulatory Visit: Payer: 59 | Admitting: Physical Medicine & Rehabilitation

## 2020-01-27 MED FILL — TRETINOIN 0.025% CREAM: 0.025 | 90 days supply | Qty: 45 | Fill #2

## 2020-01-27 MED FILL — DICLOFENAC SOD EC 75 MG TAB: 75 | 30 days supply | Qty: 60 | Fill #2

## 2020-01-27 NOTE — Progress Notes (Signed)
[facility] - [place], [place] - [address] [place] [address] Phone: [phone] Fax: [phone]      Your procedure is scheduled on Friday January 30, 2020.  Report to [facility] Main Entrance "A" at 07:00 A.M., and check in at the Admitting office.  Call this number if you have problems the morning of surgery:  [phone]  Call [phone] if you have any questions prior to your surgery date Monday-Friday 8am-4pm    Remember:  Do not eat or drink after midnight the night before your surgery    Take these medicines the morning of surgery with A SIP OF WATER:  baclofen (LIORESAL) - as needed  cyclobenzaprine (FLEXERIL) - as needed  pregabalin (LYRICA)  >>As of today, STOP taking any Aspirin (unless otherwise instructed by your surgeon), diclofenac (VOLTAREN), Aleve, Naproxen, Ibuprofen, Motrin, Advil, Goody's, BC's, all herbal medications, fish oil, and all vitamins. This includes Biotin, Cholecalciferol (EQL VITAMIN D3), Omega-3 Fatty Acids (fish oil), vitamin B-12 (CYANOCOBALAMIN), and vitamin C (ASCORBIC ACID).     The Morning of Surgery  Do not wear jewelry, make-up or nail polish.  Do not wear lotions, powders, perfumes, or deodorant  Do not shave 48 hours prior to surgery.   Do not bring valuables to the hospital.  [facility] is not responsible for any belongings or valuables.  If you are a smoker, DO NOT Smoke 24 hours prior to surgery  If you wear a CPAP at night please bring your mask the morning of surgery   Remember that you must have someone to transport you home after your surgery, and remain with you for 24 hours if you are discharged the same day.   Please bring cases for contacts, glasses, hearing aids, dentures or bridgework because it cannot be worn into surgery.    Leave your suitcase in the car.  After surgery it may be brought to your room.  For patients admitted to the hospital,  discharge time will be determined by your treatment team.  Patients discharged the day of surgery will not be allowed to drive home.    Special instructions:   [facility]- Preparing For Surgery  Before surgery, you can play an important role. Because skin is not sterile, your skin needs to be as free of germs as possible. You can reduce the number of germs on your skin by washing with CHG (chlorahexidine gluconate) Soap before surgery.  CHG is an antiseptic cleaner which kills germs and bonds with the skin to continue killing germs even after washing.    Oral Hygiene is also important to reduce your risk of infection.  Remember - BRUSH YOUR TEETH THE MORNING OF SURGERY WITH YOUR REGULAR TOOTHPASTE  Please do not use if you have an allergy to CHG or antibacterial soaps. If your skin becomes reddened/irritated stop using the CHG.  Do not shave (including legs and underarms) for at least 48 hours prior to first CHG shower. It is OK to shave your face.  Please follow these instructions carefully.   1. Shower the NIGHT BEFORE SURGERY and the MORNING OF SURGERY with CHG Soap.   2. If you chose to wash your hair, wash your hair first as usual with your normal shampoo.  3. After you shampoo, rinse your hair and body thoroughly to remove the shampoo.  4. Use CHG as you would any other liquid soap. You can apply CHG directly to the skin and wash gently  with a scrungie or a clean washcloth.   5. Apply the CHG Soap to your body ONLY FROM THE NECK DOWN.  Do not use on open wounds or open sores. Avoid contact with your eyes, ears, mouth and genitals (private parts). Wash Face and genitals (private parts)  with your normal soap.   6. Wash thoroughly, paying special attention to the area where your surgery will be performed.  7. Thoroughly rinse your body with warm water from the neck down.  8. DO NOT shower/wash with your normal soap after using and rinsing off the CHG Soap.  9. Pat yourself dry  with a CLEAN TOWEL.  10. Wear CLEAN PAJAMAS to bed the night before surgery, wear comfortable clothes the morning of surgery  11. Place CLEAN SHEETS on your bed the night of your first shower and DO NOT SLEEP WITH PETS.    Day of Surgery:  Please shower the morning of surgery with the CHG soap Do not apply any deodorants/lotions. Please wear clean clothes to the hospital/surgery center.   Remember to brush your teeth WITH YOUR REGULAR TOOTHPASTE.   Please read over the following fact sheets that you were given.

## 2020-01-28 ENCOUNTER — Encounter (HOSPITAL_COMMUNITY)
Admission: RE | Admit: 2020-01-28 | Discharge: 2020-01-28 | Disposition: A | Discharge status: Home / Self Care | Payer: 59 | Source: Ambulatory Visit | Attending: Neurosurgery | Admitting: Neurosurgery

## 2020-01-28 ENCOUNTER — Other Ambulatory Visit (HOSPITAL_COMMUNITY): Payer: 59

## 2020-01-28 ENCOUNTER — Other Ambulatory Visit (HOSPITAL_COMMUNITY)
Admission: RE | Admit: 2020-01-28 | Discharge: 2020-01-28 | Disposition: A | Discharge status: Home / Self Care | Payer: 59 | Source: Ambulatory Visit | Attending: Neurosurgery | Admitting: Neurosurgery

## 2020-01-28 ENCOUNTER — Encounter (HOSPITAL_COMMUNITY): Payer: Self-pay

## 2020-01-28 ENCOUNTER — Other Ambulatory Visit: Payer: Self-pay

## 2020-01-28 DIAGNOSIS — M48062 Spinal stenosis, lumbar region with neurogenic claudication: Secondary | ICD-10-CM | POA: Diagnosis not present

## 2020-01-28 DIAGNOSIS — I10 Essential (primary) hypertension: Secondary | ICD-10-CM | POA: Diagnosis not present

## 2020-01-28 DIAGNOSIS — J45909 Unspecified asthma, uncomplicated: Secondary | ICD-10-CM | POA: Diagnosis not present

## 2020-01-28 DIAGNOSIS — G959 Disease of spinal cord, unspecified: Secondary | ICD-10-CM | POA: Diagnosis not present

## 2020-01-28 DIAGNOSIS — M4316 Spondylolisthesis, lumbar region: Secondary | ICD-10-CM | POA: Diagnosis not present

## 2020-01-28 DIAGNOSIS — R258 Other abnormal involuntary movements: Secondary | ICD-10-CM | POA: Diagnosis not present

## 2020-01-28 DIAGNOSIS — K08409 Partial loss of teeth, unspecified cause, unspecified class: Secondary | ICD-10-CM | POA: Diagnosis not present

## 2020-01-28 DIAGNOSIS — Z01818 Encounter for other preprocedural examination: Secondary | ICD-10-CM | POA: Insufficient documentation

## 2020-01-28 DIAGNOSIS — Z981 Arthrodesis status: Secondary | ICD-10-CM | POA: Diagnosis not present

## 2020-01-28 DIAGNOSIS — Z20822 Contact with and (suspected) exposure to covid-19: Secondary | ICD-10-CM | POA: Insufficient documentation

## 2020-01-28 HISTORY — DX: Paralytic syndrome, unspecified: G83.9

## 2020-01-28 LAB — BASIC METABOLIC PANEL
Anion gap: 8 (ref 5–15)
BUN: 10 mg/dL (ref 6–20)
CO2: 25 mmol/L (ref 22–32)
Calcium: 9.5 mg/dL (ref 8.9–10.3)
Chloride: 98 mmol/L (ref 98–111)
Creatinine, Ser: 0.54 mg/dL (ref 0.44–1.00)
GFR calc Af Amer: >60 mL/min (ref >60)
GFR calc non Af Amer: >60 mL/min (ref >60)
Glucose, Bld: 92 mg/dL (ref 70–99)
Potassium: 3.6 mmol/L (ref 3.5–5.1)
Sodium: 131 mmol/L — ABNORMAL LOW (ref 135–145)

## 2020-01-28 LAB — CBC
HCT: 40.8 % (ref 36.0–46.0)
Hemoglobin: 13.9 g/dL (ref 12.0–15.0)
MCH: 31.9 pg (ref 26.0–34.0)
MCHC: 34.1 g/dL (ref 30.0–36.0)
MCV: 93.6 fL (ref 80.0–100.0)
Platelets: 288 10*3/uL (ref 150–400)
RBC: 4.36 MIL/uL (ref 3.87–5.11)
RDW: 12.1 % (ref 11.5–15.5)
WBC: 3.9 10*3/uL — ABNORMAL LOW (ref 4.0–10.5)
nRBC: 0 % (ref 0.0–0.2)

## 2020-01-28 LAB — SURGICAL PCR SCREEN
MRSA, PCR: NEGATIVE
Staphylococcus aureus: NEGATIVE

## 2020-01-28 LAB — SARS CORONAVIRUS 2 (TAT 6-24 HRS): SARS Coronavirus 2: NEGATIVE

## 2020-01-28 LAB — TYPE AND SCREEN
ABO/RH(D): A POS
Antibody Screen: NEGATIVE

## 2020-01-28 LAB — ABO/RH: ABO/RH(D): A POS

## 2020-01-28 NOTE — Progress Notes (Signed)
PCP - Dr. Jacinta Shoe Cardiologist - Denies  PPM/ICD - Denies  Chest x-ray - N/A EKG - 01/28/20 Stress Test - Denies  ECHO - Denies Cardiac Cath - Denies  Sleep Study - Denies  Pt denies being diabetic.  Blood Thinner Instructions: N/A Aspirin Instructions: N/A  ERAS Protcol - No  COVID TEST- 01/28/20   Coronavirus Screening  Have you experienced the following symptoms:  Cough yes/no: No Fever (>100.55F)  yes/no: No Runny nose yes/no: No Sore throat yes/no: No Difficulty breathing/shortness of breath  yes/no: No  Have you or a family member traveled in the last 14 days and where? yes/no: No   If the patient indicates "YES" to the above questions, their PAT will be rescheduled to limit the exposure to others and, the surgeon will be notified. THE PATIENT WILL NEED TO BE ASYMPTOMATIC FOR 14 DAYS.   If the patient is not experiencing any of these symptoms, the PAT nurse will instruct them to NOT bring anyone with them to their appointment since they may have these symptoms or traveled as well.   Please remind your patients and families that hospital visitation restrictions are in effect and the importance of the restrictions.     Anesthesia review: Yes, cardiac hx  Patient denies shortness of breath, fever, cough and chest pain at PAT appointment   All instructions explained to the patient, with a verbal understanding of the material. Patient agrees to go over the instructions while at home for a better understanding. Patient also instructed to self quarantine after being tested for COVID-19. The opportunity to ask questions was provided.

## 2020-01-29 ENCOUNTER — Encounter (HOSPITAL_COMMUNITY): Payer: Self-pay

## 2020-01-29 NOTE — Anesthesia Preprocedure Evaluation (Addendum)
Anesthesia Evaluation  Patient identified by MRN, date of birth, ID band Patient awake    Reviewed: Allergy & Precautions, NPO status , Patient's Chart, lab work & pertinent test results  Airway Mallampati: II   Neck ROM: Limited    Dental  (+) Edentulous Upper, Partial Lower   Pulmonary    breath sounds clear to auscultation       Cardiovascular hypertension,  Rhythm:Regular Rate:Normal     Neuro/Psych    GI/Hepatic   Endo/Other    Renal/GU      Musculoskeletal   Abdominal   Peds  Hematology   Anesthesia Other Findings   Reproductive/Obstetrics                             Anesthesia Physical Anesthesia Plan  ASA: III  Anesthesia Plan: General   Post-op Pain Management:    Induction: Intravenous  PONV Risk Score and Plan: Ondansetron and Dexamethasone  Airway Management Planned: Oral ETT  Additional Equipment:   Intra-op Plan:   Post-operative Plan: Extubation in OR  Informed Consent: I have reviewed the patients History and Physical, chart, labs and discussed the procedure including the risks, benefits and alternatives for the proposed anesthesia with the patient or authorized representative who has indicated his/her understanding and acceptance.       Plan Discussed with: CRNA and Anesthesiologist  Anesthesia Plan Comments: (See PAT note written 01/29/2020 by Shonna Chock, PA-C. History of incomplete spinal cord injury C5 level. )       Anesthesia Quick Evaluation
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
## 2020-01-30 ENCOUNTER — Other Ambulatory Visit: Payer: Self-pay

## 2020-01-30 ENCOUNTER — Inpatient Hospital Stay (HOSPITAL_COMMUNITY): Payer: 59

## 2020-01-30 ENCOUNTER — Encounter (HOSPITAL_COMMUNITY)
Admission: RE | Disposition: A | Discharge status: Home Health | Payer: Self-pay | Source: Home / Self Care | Attending: Neurosurgery

## 2020-01-30 ENCOUNTER — Inpatient Hospital Stay (HOSPITAL_COMMUNITY): Payer: 59 | Admitting: Anesthesiology

## 2020-01-30 ENCOUNTER — Inpatient Hospital Stay (HOSPITAL_COMMUNITY)
Admission: RE | Admit: 2020-01-30 | Discharge: 2020-02-05 | DRG: 460 | Disposition: A | Discharge status: Home Health | Payer: 59 | Attending: Neurosurgery | Admitting: Neurosurgery

## 2020-01-30 ENCOUNTER — Encounter (HOSPITAL_COMMUNITY): Payer: Self-pay | Admitting: Neurosurgery

## 2020-01-30 DIAGNOSIS — M48062 Spinal stenosis, lumbar region with neurogenic claudication: Principal | ICD-10-CM | POA: Diagnosis present

## 2020-01-30 DIAGNOSIS — M4726 Other spondylosis with radiculopathy, lumbar region: Secondary | ICD-10-CM | POA: Diagnosis not present

## 2020-01-30 DIAGNOSIS — Z8249 Family history of ischemic heart disease and other diseases of the circulatory system: Secondary | ICD-10-CM | POA: Diagnosis not present

## 2020-01-30 DIAGNOSIS — K08409 Partial loss of teeth, unspecified cause, unspecified class: Secondary | ICD-10-CM | POA: Diagnosis present

## 2020-01-30 DIAGNOSIS — R258 Other abnormal involuntary movements: Secondary | ICD-10-CM | POA: Diagnosis present

## 2020-01-30 DIAGNOSIS — Z981 Arthrodesis status: Secondary | ICD-10-CM | POA: Diagnosis not present

## 2020-01-30 DIAGNOSIS — I10 Essential (primary) hypertension: Secondary | ICD-10-CM | POA: Diagnosis present

## 2020-01-30 DIAGNOSIS — M5416 Radiculopathy, lumbar region: Secondary | ICD-10-CM | POA: Diagnosis not present

## 2020-01-30 DIAGNOSIS — J45909 Unspecified asthma, uncomplicated: Secondary | ICD-10-CM | POA: Diagnosis present

## 2020-01-30 DIAGNOSIS — Z419 Encounter for procedure for purposes other than remedying health state, unspecified: Secondary | ICD-10-CM

## 2020-01-30 DIAGNOSIS — Z01818 Encounter for other preprocedural examination: Secondary | ICD-10-CM

## 2020-01-30 DIAGNOSIS — M4326 Fusion of spine, lumbar region: Secondary | ICD-10-CM | POA: Diagnosis not present

## 2020-01-30 DIAGNOSIS — M4316 Spondylolisthesis, lumbar region: Secondary | ICD-10-CM | POA: Diagnosis present

## 2020-01-30 DIAGNOSIS — Z20822 Contact with and (suspected) exposure to covid-19: Secondary | ICD-10-CM | POA: Diagnosis present

## 2020-01-30 DIAGNOSIS — M48061 Spinal stenosis, lumbar region without neurogenic claudication: Secondary | ICD-10-CM | POA: Diagnosis not present

## 2020-01-30 DIAGNOSIS — G959 Disease of spinal cord, unspecified: Secondary | ICD-10-CM | POA: Diagnosis present

## 2020-01-30 LAB — POCT I-STAT, CHEM 8
BUN: 6 mg/dL (ref 6–20)
Calcium, Ion: 1.21 mmol/L (ref 1.15–1.40)
Chloride: 101 mmol/L (ref 98–111)
Creatinine, Ser: 0.5 mg/dL (ref 0.44–1.00)
Glucose, Bld: 139 mg/dL — ABNORMAL HIGH (ref 70–99)
HCT: 29 % — ABNORMAL LOW (ref 36.0–46.0)
Hemoglobin: 9.9 g/dL — ABNORMAL LOW (ref 12.0–15.0)
Potassium: 4.3 mmol/L (ref 3.5–5.1)
Sodium: 136 mmol/L (ref 135–145)
TCO2: 24 mmol/L (ref 22–32)

## 2020-01-30 LAB — CBC
HCT: 27.3 % — ABNORMAL LOW (ref 36.0–46.0)
Hemoglobin: 9.6 g/dL — ABNORMAL LOW (ref 12.0–15.0)
MCH: 32.2 pg (ref 26.0–34.0)
MCHC: 35.2 g/dL (ref 30.0–36.0)
MCV: 91.6 fL (ref 80.0–100.0)
Platelets: 151 10*3/uL (ref 150–400)
RBC: 2.98 MIL/uL — ABNORMAL LOW (ref 3.87–5.11)
RDW: 12 % (ref 11.5–15.5)
WBC: 9.5 10*3/uL (ref 4.0–10.5)
nRBC: 0 % (ref 0.0–0.2)

## 2020-01-30 SURGERY — POSTERIOR LUMBAR FUSION 2 LEVEL
Anesthesia: General | Site: Spine Lumbar

## 2020-01-30 MED ORDER — ALBUMIN HUMAN 5 % IV SOLN
12.5000 g | Freq: Once | INTRAVENOUS | Status: AC
  Administered 2020-01-30: 12.5 g via INTRAVENOUS

## 2020-01-30 MED ORDER — THROMBIN 5000 UNITS EX SOLR
CUTANEOUS | Status: AC
  Filled 2020-01-30: qty 5000

## 2020-01-30 MED ORDER — KETOROLAC TROMETHAMINE 15 MG/ML IJ SOLN
INTRAMUSCULAR | Status: AC
  Filled 2020-01-30: qty 1

## 2020-01-30 MED ORDER — PROPOFOL 10 MG/ML IV BOLUS
INTRAVENOUS | Status: AC
  Filled 2020-01-30: qty 20

## 2020-01-30 MED ORDER — BIOTIN 5000 MCG PO CAPS: 5000.0000 ug | ORAL_CAPSULE | Freq: Every day | ORAL | Status: DC

## 2020-01-30 MED ORDER — FENTANYL CITRATE (PF) 250 MCG/5ML IJ SOLN
INTRAMUSCULAR | Status: AC
  Filled 2020-01-30: qty 5

## 2020-01-30 MED ORDER — PHENYLEPHRINE HCL (PRESSORS) 10 MG/ML IV SOLN
INTRAVENOUS | Status: DC | PRN
  Administered 2020-01-30 (×3): 80 ug via INTRAVENOUS

## 2020-01-30 MED ORDER — THROMBIN 5000 UNITS EX SOLR
OROMUCOSAL | Status: DC | PRN
  Administered 2020-01-30: 5 mL via TOPICAL

## 2020-01-30 MED ORDER — KETOROLAC TROMETHAMINE 15 MG/ML IJ SOLN
15.0000 mg | Freq: Four times a day (QID) | INTRAMUSCULAR | Status: AC
  Administered 2020-01-30 – 2020-01-31 (×4): 15 mg via INTRAVENOUS
  Filled 2020-01-30 (×4): qty 1

## 2020-01-30 MED ORDER — DEXAMETHASONE SODIUM PHOSPHATE 10 MG/ML IJ SOLN
INTRAMUSCULAR | Status: DC | PRN
  Administered 2020-01-30: 10 mg via INTRAVENOUS

## 2020-01-30 MED ORDER — DEXMEDETOMIDINE HCL 200 MCG/2ML IV SOLN
INTRAVENOUS | Status: DC | PRN
  Administered 2020-01-30 (×4): 4 ug via INTRAVENOUS

## 2020-01-30 MED ORDER — PREGABALIN 100 MG PO CAPS
100.0000 mg | ORAL_CAPSULE | Freq: Two times a day (BID) | ORAL | Status: DC
  Administered 2020-01-30 – 2020-02-05 (×12): 100 mg via ORAL
  Filled 2020-01-30 (×12): qty 1

## 2020-01-30 MED ORDER — BACLOFEN 10 MG PO TABS
10.0000 mg | ORAL_TABLET | Freq: Three times a day (TID) | ORAL | Status: DC | PRN
  Administered 2020-01-31: 2.5 mg via ORAL
  Administered 2020-02-02 – 2020-02-04 (×2): 10 mg via ORAL
  Administered 2020-02-04: 5 mg via ORAL
  Filled 2020-01-30 (×4): qty 1

## 2020-01-30 MED ORDER — DOCUSATE SODIUM 100 MG PO CAPS
100.0000 mg | ORAL_CAPSULE | Freq: Two times a day (BID) | ORAL | Status: DC
  Administered 2020-01-30 – 2020-02-03 (×9): 100 mg via ORAL
  Filled 2020-01-30 (×11): qty 1

## 2020-01-30 MED ORDER — ONDANSETRON HCL 4 MG/2ML IJ SOLN
INTRAMUSCULAR | Status: DC | PRN
  Administered 2020-01-30: 4 mg via INTRAVENOUS

## 2020-01-30 MED ORDER — SUCCINYLCHOLINE CHLORIDE 200 MG/10ML IV SOSY: PREFILLED_SYRINGE | INTRAVENOUS | Status: DC | PRN

## 2020-01-30 MED ORDER — CEFAZOLIN SODIUM-DEXTROSE 2-4 GM/100ML-% IV SOLN
2.0000 g | INTRAVENOUS | Status: AC
  Administered 2020-01-30: 2 g via INTRAVENOUS
  Filled 2020-01-30: qty 100

## 2020-01-30 MED ORDER — ONDANSETRON HCL 4 MG/2ML IJ SOLN: 4.0000 mg | Freq: Four times a day (QID) | INTRAMUSCULAR | Status: DC | PRN

## 2020-01-30 MED ORDER — FENTANYL CITRATE (PF) 100 MCG/2ML IJ SOLN
25.0000 ug | INTRAMUSCULAR | Status: DC | PRN
  Administered 2020-01-30 (×3): 25 ug via INTRAVENOUS

## 2020-01-30 MED ORDER — ONDANSETRON HCL 4 MG/2ML IJ SOLN
INTRAMUSCULAR | Status: AC
  Filled 2020-01-30: qty 2

## 2020-01-30 MED ORDER — ONDANSETRON HCL 4 MG PO TABS: 4.0000 mg | ORAL_TABLET | Freq: Four times a day (QID) | ORAL | Status: DC | PRN

## 2020-01-30 MED ORDER — SODIUM CHLORIDE 0.9% FLUSH
3.0000 mL | Freq: Two times a day (BID) | INTRAVENOUS | Status: DC
  Administered 2020-01-31 – 2020-02-04 (×6): 3 mL via INTRAVENOUS

## 2020-01-30 MED ORDER — MEDROXYPROGESTERONE ACETATE 2.5 MG PO TABS
2.5000 mg | ORAL_TABLET | Freq: Every day | ORAL | Status: DC
  Filled 2020-01-30 (×7): qty 1

## 2020-01-30 MED ORDER — EPHEDRINE SULFATE 50 MG/ML IJ SOLN
INTRAMUSCULAR | Status: DC | PRN
  Administered 2020-01-30: 10 mg via INTRAVENOUS

## 2020-01-30 MED ORDER — THROMBIN 20000 UNITS EX SOLR
CUTANEOUS | Status: AC
  Filled 2020-01-30: qty 20000

## 2020-01-30 MED ORDER — ACETAMINOPHEN 325 MG PO TABS
650.0000 mg | ORAL_TABLET | ORAL | Status: DC | PRN
  Administered 2020-01-30 – 2020-02-03 (×6): 650 mg via ORAL
  Filled 2020-01-30 (×7): qty 2

## 2020-01-30 MED ORDER — OXYCODONE HCL 5 MG PO TABS
5.0000 mg | ORAL_TABLET | ORAL | Status: DC | PRN
  Administered 2020-01-31 – 2020-02-02 (×5): 5 mg via ORAL
  Filled 2020-01-30 (×4): qty 1

## 2020-01-30 MED ORDER — TRETINOIN 0.025 % EX CREA: 1.0000 "application " | TOPICAL_CREAM | Freq: Every day | CUTANEOUS | Status: DC

## 2020-01-30 MED ORDER — LACTATED RINGERS IV SOLN: INTRAVENOUS | Status: DC

## 2020-01-30 MED ORDER — THROMBIN 20000 UNITS EX SOLR
CUTANEOUS | Status: DC | PRN
  Administered 2020-01-30: 20 mL via TOPICAL

## 2020-01-30 MED ORDER — CONJ ESTROG-MEDROXYPROGEST ACE 0.625-2.5 MG PO TABS: 1.0000 | ORAL_TABLET | Freq: Every day | ORAL | Status: DC

## 2020-01-30 MED ORDER — OXYCODONE HCL 5 MG PO TABS
10.0000 mg | ORAL_TABLET | ORAL | Status: DC | PRN
  Administered 2020-01-31 – 2020-02-05 (×16): 10 mg via ORAL
  Filled 2020-01-30 (×17): qty 2

## 2020-01-30 MED ORDER — BUPIVACAINE HCL (PF) 0.5 % IJ SOLN
INTRAMUSCULAR | Status: AC
  Filled 2020-01-30: qty 30

## 2020-01-30 MED ORDER — ASCORBIC ACID 500 MG PO TABS
500.0000 mg | ORAL_TABLET | Freq: Every day | ORAL | Status: DC
  Administered 2020-01-30 – 2020-02-04 (×6): 500 mg via ORAL
  Filled 2020-01-30 (×6): qty 1

## 2020-01-30 MED ORDER — ACETAMINOPHEN 10 MG/ML IV SOLN
INTRAVENOUS | Status: DC | PRN
  Administered 2020-01-30: 1000 mg via INTRAVENOUS

## 2020-01-30 MED ORDER — SENNOSIDES-DOCUSATE SODIUM 8.6-50 MG PO TABS: 1.0000 | ORAL_TABLET | Freq: Every evening | ORAL | Status: DC | PRN

## 2020-01-30 MED ORDER — HEMOSTATIC AGENTS (NO CHARGE) OPTIME
TOPICAL | Status: DC | PRN
  Administered 2020-01-30: 1 via TOPICAL

## 2020-01-30 MED ORDER — ACETAMINOPHEN 650 MG RE SUPP: 650.0000 mg | RECTAL | Status: DC | PRN

## 2020-01-30 MED ORDER — ALBUMIN HUMAN 5 % IV SOLN: INTRAVENOUS | Status: DC | PRN

## 2020-01-30 MED ORDER — MIDAZOLAM HCL 2 MG/2ML IJ SOLN
INTRAMUSCULAR | Status: AC
  Filled 2020-01-30: qty 2

## 2020-01-30 MED ORDER — ALBUMIN HUMAN 5 % IV SOLN
INTRAVENOUS | Status: AC
  Filled 2020-01-30: qty 250

## 2020-01-30 MED ORDER — ESTROGENS CONJUGATED 0.625 MG PO TABS
0.6250 mg | ORAL_TABLET | Freq: Every day | ORAL | Status: DC
  Filled 2020-01-30 (×7): qty 1

## 2020-01-30 MED ORDER — PHENOL 1.4 % MT LIQD: 1.0000 | OROMUCOSAL | Status: DC | PRN

## 2020-01-30 MED ORDER — PROPOFOL 10 MG/ML IV BOLUS
INTRAVENOUS | Status: DC | PRN
  Administered 2020-01-30: 100 mg via INTRAVENOUS

## 2020-01-30 MED ORDER — 0.9 % SODIUM CHLORIDE (POUR BTL) OPTIME
TOPICAL | Status: DC | PRN
  Administered 2020-01-30 (×2): 1000 mL

## 2020-01-30 MED ORDER — MIDAZOLAM HCL 2 MG/2ML IJ SOLN
INTRAMUSCULAR | Status: DC | PRN
  Administered 2020-01-30: 2 mg via INTRAVENOUS

## 2020-01-30 MED ORDER — VITAMIN B-12 1000 MCG PO TABS
1000.0000 ug | ORAL_TABLET | Freq: Every day | ORAL | Status: DC
  Administered 2020-01-30 – 2020-02-04 (×6): 1000 ug via ORAL
  Filled 2020-01-30 (×6): qty 1

## 2020-01-30 MED ORDER — FENTANYL CITRATE (PF) 100 MCG/2ML IJ SOLN
INTRAMUSCULAR | Status: AC
  Filled 2020-01-30: qty 2

## 2020-01-30 MED ORDER — SODIUM CHLORIDE 0.9 % IV SOLN: 250.0000 mL | INTRAVENOUS | Status: DC

## 2020-01-30 MED ORDER — POTASSIUM CHLORIDE IN NACL 20-0.9 MEQ/L-% IV SOLN
INTRAVENOUS | Status: DC
  Filled 2020-01-30: qty 1000

## 2020-01-30 MED ORDER — BISACODYL 5 MG PO TBEC: 5.0000 mg | DELAYED_RELEASE_TABLET | Freq: Every day | ORAL | Status: DC | PRN

## 2020-01-30 MED ORDER — LIDOCAINE-EPINEPHRINE 0.5 %-1:200000 IJ SOLN
INTRAMUSCULAR | Status: AC
  Filled 2020-01-30: qty 1

## 2020-01-30 MED ORDER — ZOLPIDEM TARTRATE 5 MG PO TABS
5.0000 mg | ORAL_TABLET | Freq: Every evening | ORAL | Status: DC | PRN
  Filled 2020-01-30: qty 1

## 2020-01-30 MED ORDER — CHLORHEXIDINE GLUCONATE CLOTH 2 % EX PADS: 6.0000 | MEDICATED_PAD | Freq: Once | CUTANEOUS | Status: DC

## 2020-01-30 MED ORDER — ROCURONIUM BROMIDE 10 MG/ML (PF) SYRINGE
PREFILLED_SYRINGE | INTRAVENOUS | Status: DC | PRN
  Administered 2020-01-30: 10 mg via INTRAVENOUS
  Administered 2020-01-30: 20 mg via INTRAVENOUS
  Administered 2020-01-30: 10 mg via INTRAVENOUS
  Administered 2020-01-30 (×2): 50 mg via INTRAVENOUS

## 2020-01-30 MED ORDER — ONDANSETRON HCL 4 MG/2ML IJ SOLN
4.0000 mg | Freq: Once | INTRAMUSCULAR | Status: AC | PRN
  Administered 2020-01-30: 4 mg via INTRAVENOUS

## 2020-01-30 MED ORDER — FENTANYL CITRATE (PF) 250 MCG/5ML IJ SOLN
INTRAMUSCULAR | Status: DC | PRN
  Administered 2020-01-30 (×2): 25 ug via INTRAVENOUS
  Administered 2020-01-30 (×3): 50 ug via INTRAVENOUS

## 2020-01-30 MED ORDER — SUGAMMADEX SODIUM 200 MG/2ML IV SOLN
INTRAVENOUS | Status: DC | PRN
  Administered 2020-01-30: 120 mg via INTRAVENOUS

## 2020-01-30 MED ORDER — SODIUM CHLORIDE 0.9% FLUSH: 3.0000 mL | INTRAVENOUS | Status: DC | PRN

## 2020-01-30 MED ORDER — MENTHOL 3 MG MT LOZG: 1.0000 | LOZENGE | OROMUCOSAL | Status: DC | PRN

## 2020-01-30 MED ORDER — LIDOCAINE 2% (20 MG/ML) 5 ML SYRINGE
INTRAMUSCULAR | Status: DC | PRN
  Administered 2020-01-30: 40 mg via INTRAVENOUS

## 2020-01-30 MED ORDER — LIDOCAINE-EPINEPHRINE 0.5 %-1:200000 IJ SOLN
INTRAMUSCULAR | Status: DC | PRN
  Administered 2020-01-30: 10 mL

## 2020-01-30 MED ORDER — METOCLOPRAMIDE HCL 5 MG/ML IJ SOLN
INTRAMUSCULAR | Status: AC
  Administered 2020-01-30: 10 mg
  Filled 2020-01-30: qty 2

## 2020-01-30 MED ORDER — PHENYLEPHRINE HCL-NACL 10-0.9 MG/250ML-% IV SOLN
INTRAVENOUS | Status: DC | PRN
  Administered 2020-01-30: 25 ug/min via INTRAVENOUS

## 2020-01-30 MED ORDER — MAGNESIUM CITRATE PO SOLN
1.0000 | Freq: Once | ORAL | Status: AC | PRN
  Administered 2020-02-03: 1 via ORAL
  Filled 2020-01-30: qty 296

## 2020-01-30 MED ORDER — VITAMIN D 25 MCG (1000 UNIT) PO TABS
1000.0000 [IU] | ORAL_TABLET | Freq: Every day | ORAL | Status: DC
  Administered 2020-01-30 – 2020-02-04 (×6): 1000 [IU] via ORAL
  Filled 2020-01-30 (×6): qty 1

## 2020-01-30 MED ORDER — OXYCODONE HCL ER 10 MG PO T12A
10.0000 mg | EXTENDED_RELEASE_TABLET | Freq: Two times a day (BID) | ORAL | Status: DC
  Administered 2020-01-30 – 2020-02-05 (×12): 10 mg via ORAL
  Filled 2020-01-30 (×12): qty 1

## 2020-01-30 MED ORDER — BUPIVACAINE HCL (PF) 0.5 % IJ SOLN
INTRAMUSCULAR | Status: DC | PRN
  Administered 2020-01-30: 30 mL

## 2020-01-30 MED ORDER — MORPHINE SULFATE (PF) 2 MG/ML IV SOLN: 1.0000 mg | INTRAVENOUS | Status: DC | PRN

## 2020-01-30 SURGICAL SUPPLY — 75 items
BAG DECANTER FOR FLEXI CONT (MISCELLANEOUS) ×2 IMPLANT
BENZOIN TINCTURE PRP APPL 2/3 (GAUZE/BANDAGES/DRESSINGS) IMPLANT
BLADE CLIPPER SURG (BLADE) IMPLANT
BLOCKER XIA CT (Orthopedic Implant) ×12 IMPLANT
BUR MATCHSTICK NEURO 3.0 LAGG (BURR) ×2 IMPLANT
BUR PRECISION FLUTE 5.0 (BURR) ×2 IMPLANT
CABLE BIPOLOR RESECTION CORD (MISCELLANEOUS) ×2 IMPLANT
CAGE PROLIFT 10X28 SM 12D (Cage) ×4 IMPLANT
CANISTER SUCT 3000ML PPV (MISCELLANEOUS) ×2 IMPLANT
CARTRIDGE OIL MAESTRO DRILL (MISCELLANEOUS) ×1 IMPLANT
CNTNR URN SCR LID CUP LEK RST (MISCELLANEOUS) ×1 IMPLANT
CONT SPEC 4OZ STRL OR WHT (MISCELLANEOUS) ×2
COVER BACK TABLE 60X90IN (DRAPES) ×2 IMPLANT
COVER WAND RF STERILE (DRAPES) IMPLANT
DECANTER SPIKE VIAL GLASS SM (MISCELLANEOUS) ×2 IMPLANT
DERMABOND ADVANCED (GAUZE/BANDAGES/DRESSINGS) ×1
DERMABOND ADVANCED .7 DNX12 (GAUZE/BANDAGES/DRESSINGS) ×1 IMPLANT
DIFFUSER DRILL AIR PNEUMATIC (MISCELLANEOUS) ×2 IMPLANT
DRAPE C-ARM 42X72 X-RAY (DRAPES) ×4 IMPLANT
DRAPE C-ARMOR (DRAPES) ×2 IMPLANT
DRAPE LAPAROTOMY 100X72X124 (DRAPES) ×2 IMPLANT
DRAPE SURG 17X23 STRL (DRAPES) ×2 IMPLANT
DRSG OPSITE POSTOP 4X6 (GAUZE/BANDAGES/DRESSINGS) ×2 IMPLANT
DURAPREP 26ML APPLICATOR (WOUND CARE) ×2 IMPLANT
ELECT REM PT RETURN 9FT ADLT (ELECTROSURGICAL) ×2
ELECTRODE REM PT RTRN 9FT ADLT (ELECTROSURGICAL) ×1 IMPLANT
GAUZE 4X4 16PLY RFD (DISPOSABLE) ×2 IMPLANT
GAUZE SPONGE 4X4 12PLY STRL (GAUZE/BANDAGES/DRESSINGS) IMPLANT
GLOVE BIO SURGEON STRL SZ 6.5 (GLOVE) ×8 IMPLANT
GLOVE BIO SURGEON STRL SZ7 (GLOVE) ×2 IMPLANT
GLOVE BIO SURGEON STRL SZ7.5 (GLOVE) ×6 IMPLANT
GLOVE BIOGEL PI IND STRL 6.5 (GLOVE) ×3 IMPLANT
GLOVE BIOGEL PI IND STRL 7.5 (GLOVE) ×4 IMPLANT
GLOVE BIOGEL PI INDICATOR 6.5 (GLOVE) ×3
GLOVE BIOGEL PI INDICATOR 7.5 (GLOVE) ×4
GLOVE ECLIPSE 6.5 STRL STRAW (GLOVE) ×8 IMPLANT
GLOVE EXAM NITRILE XL STR (GLOVE) IMPLANT
GLOVE SURG SS PI 7.0 STRL IVOR (GLOVE) ×2 IMPLANT
GOWN STRL REUS W/ TWL LRG LVL3 (GOWN DISPOSABLE) ×6 IMPLANT
GOWN STRL REUS W/ TWL XL LVL3 (GOWN DISPOSABLE) ×3 IMPLANT
GOWN STRL REUS W/TWL 2XL LVL3 (GOWN DISPOSABLE) IMPLANT
GOWN STRL REUS W/TWL LRG LVL3 (GOWN DISPOSABLE) ×12
GOWN STRL REUS W/TWL XL LVL3 (GOWN DISPOSABLE) ×6
HEMOSTAT POWDER KIT SURGIFOAM (HEMOSTASIS) ×2 IMPLANT
KIT BASIN OR (CUSTOM PROCEDURE TRAY) ×2 IMPLANT
KIT POSITION SURG JACKSON T1 (MISCELLANEOUS) ×2 IMPLANT
KIT TURNOVER KIT B (KITS) ×2 IMPLANT
MILL MEDIUM DISP (BLADE) IMPLANT
NEEDLE HYPO 25X1 1.5 SAFETY (NEEDLE) ×2 IMPLANT
NEEDLE SPNL 18GX3.5 QUINCKE PK (NEEDLE) ×2 IMPLANT
NS IRRIG 1000ML POUR BTL (IV SOLUTION) ×4 IMPLANT
OIL CARTRIDGE MAESTRO DRILL (MISCELLANEOUS) ×2
PACK LAMINECTOMY NEURO (CUSTOM PROCEDURE TRAY) ×2 IMPLANT
PAD ARMBOARD 7.5X6 YLW CONV (MISCELLANEOUS) ×6 IMPLANT
ROD SPINAL HEX 4.5X50 (Rod) ×2 IMPLANT
ROD SPINAL HEX 4.5X60 (Rod) ×2 IMPLANT
SCREW CORT PA XIA 5.5X40 (Screw) ×2 IMPLANT
SCREW PA XIA CT 5.5X30 (Screw) ×4 IMPLANT
SCREW PA XIA CT 5.5X35 (Screw) ×6 IMPLANT
SEALANT ADHERUS EXTEND TIP (MISCELLANEOUS) ×2 IMPLANT
SLEEVE SURGEON STRL (DRAPES) ×2 IMPLANT
SPACER EX PROLFT 10X28X8 SM 7D (Spacer) ×2 IMPLANT
SPACER EXP PROLIFT 10X28 SM 7D (Spacer) ×4 IMPLANT
SPONGE LAP 4X18 RFD (DISPOSABLE) IMPLANT
SPONGE SURGIFOAM ABS GEL 100 (HEMOSTASIS) ×2 IMPLANT
STRIP CLOSURE SKIN 1/2X4 (GAUZE/BANDAGES/DRESSINGS) IMPLANT
SUT PROLENE 6 0 BV (SUTURE) ×2 IMPLANT
SUT VIC AB 0 CT1 18XCR BRD8 (SUTURE) ×3 IMPLANT
SUT VIC AB 0 CT1 8-18 (SUTURE) ×6
SUT VIC AB 2-0 CT1 18 (SUTURE) ×2 IMPLANT
SUT VIC AB 3-0 SH 8-18 (SUTURE) ×4 IMPLANT
TAP CANN XIA CT 5.5X35 (Orthopedic Implant) ×4 IMPLANT
TOWEL GREEN STERILE (TOWEL DISPOSABLE) ×2 IMPLANT
TOWEL GREEN STERILE FF (TOWEL DISPOSABLE) ×2 IMPLANT
WATER STERILE IRR 1000ML POUR (IV SOLUTION) ×2 IMPLANT

## 2020-01-30 NOTE — Anesthesia Procedure Notes (Signed)
Procedure Name: Intubation Date/Time: 01/30/2020 9:58 AM Performed by: Dairl Ponder, CRNA Pre-anesthesia Checklist: Patient identified, Emergency Drugs available, Suction available, Patient being monitored and Timeout performed Patient Re-evaluated:Patient Re-evaluated prior to induction Oxygen Delivery Method: Circle system utilized Preoxygenation: Pre-oxygenation with 100% oxygen Induction Type: IV induction Ventilation: Mask ventilation without difficulty Laryngoscope Size: Glidescope and 3 (limited neck ROM) Grade View: Grade I Tube type: Oral Tube size: 7.0 mm Number of attempts: 1 Airway Equipment and Method: Stylet Placement Confirmation: ETT inserted through vocal cords under direct vision,  positive ETCO2 and breath sounds checked- equal and bilateral Secured at: 22 cm Tube secured with: Tape Dental Injury: Teeth and Oropharynx as per pre-operative assessment

## 2020-01-30 NOTE — H&P (Signed)
BP (!) 164/63   Pulse 67   Temp 98.6 F (37 C) (Oral)   Resp 18   SpO2 100%  CC: lower extremity pain HPI: Kelly Wall is a 59 y.o. female Whom has severe pain with standing and walking. MRI shows stenosis at L4/5, and 5/S1 Allergies  Allergen Reactions  . Butorphanol Tartrate Shortness Of Breath  . Tramadol Shortness Of Breath  . Flexeril [Cyclobenzaprine] Other (See Comments)    Knocks her out  . Hydrochlorothiazide W-Triamterene Other (See Comments)    REACTION: dizziness  . Propranolol Hcl Other (See Comments)    Chest pain  . Tetracycline Hcl Other (See Comments)    Bleeding in throat   Past Medical History:  Diagnosis Date  . Asthma    child- occ exersie induced now  . Family history of adverse reaction to anesthesia    dad hard to awaken  . Hypertension    no rx  in 5 yrs fish oil helps now  . Paralysis [facility])    Past Surgical History:  Procedure Laterality Date  . ANTERIOR CERVICAL DECOMP/DISCECTOMY FUSION N/A 03/30/2018   Procedure: ANTERIOR CERVICAL DECOMPRESSION/DISCECTOMY FUSION Cervical four-five and Cervical five-six;  Surgeon: Ashok Pall, MD;  Location: [facility];  Service: Neurosurgery;  Laterality: N/A;  . BACK SURGERY    . CERVICAL DISC ARTHROPLASTY N/A 04/20/2016   Procedure: Cervical Six-Seven Artificial Disc Replacement-Cervical;  Surgeon: Eustace Moore, MD;  Location: [place] NEURO ORS;  Service: Neurosurgery;  Laterality: N/A;   Family History  Problem Relation Age of Onset  . Hypertension Other    Social History   Socioeconomic History  . Marital status: Legally Separated    Spouse name: Not on file  . Number of children: Not on file  . Years of education: Not on file  . Highest education level: Not on file  Occupational History  . Occupation: ICU secretary  Tobacco Use  . Smoking status: Never Smoker  . Smokeless tobacco: Never Used  Substance and Sexual Activity  . Alcohol use: No  . Drug use: No  . Sexual activity: Yes    Birth  control/protection: Condom  Other Topics Concern  . Not on file  Social History Narrative   Regular exercise- yes   Social Determinants of Health   Financial Resource Strain:   . Difficulty of Paying Living Expenses: Not on file  Food Insecurity:   . Worried About Charity fundraiser in the Last Year: Not on file  . Ran Out of Food in the Last Year: Not on file  Transportation Needs:   . Lack of Transportation (Medical): Not on file  . Lack of Transportation (Non-Medical): Not on file  Physical Activity:   . Days of Exercise per Week: Not on file  . Minutes of Exercise per Session: Not on file  Stress:   . Feeling of Stress : Not on file  Social Connections:   . Frequency of Communication with Friends and Family: Not on file  . Frequency of Social Gatherings with Friends and Family: Not on file  . Attends Religious Services: Not on file  . Active Member of Clubs or Organizations: Not on file  . Attends Archivist Meetings: Not on file  . Marital Status: Not on file  Intimate Partner Violence:   . Fear of Current or Ex-Partner: Not on file  . Emotionally Abused: Not on file  . Physically Abused: Not on file  . Sexually Abused: Not on file  Physical Exam Constitutional:      Appearance: Normal appearance. She is normal weight.  HENT:     Head: Normocephalic and atraumatic.     Nose: Nose normal.     Mouth/Throat:     Mouth: Mucous membranes are dry.  Eyes:     Extraocular Movements: Extraocular movements intact.     Pupils: Pupils are equal, round, and reactive to light.  Cardiovascular:     Rate and Rhythm: Normal rate and regular rhythm.  Pulmonary:     Effort: Pulmonary effort is normal.     Breath sounds: Normal breath sounds.  Abdominal:     General: Abdomen is flat.     Palpations: Abdomen is soft.  Musculoskeletal:     Cervical back: Rigidity present.  Neurological:     Mental Status: She is alert and oriented to person, place, and time.      Cranial Nerves: Cranial nerves are intact.     Sensory: Sensation is intact.     Motor: Abnormal muscle tone present.     Coordination: Coordination abnormal.     Gait: Gait abnormal.     Comments: Cervical myelopathy Clonus +hoffmans Spastic gait Difficulty with fine motor movement   OR for decompression and arthrodesis L4/5,5/S1 Pedicle screw fixation, interbody

## 2020-01-30 NOTE — Progress Notes (Signed)
PHARMACIST - PHYSICIAN ORDER COMMUNICATION  CONCERNING: P&T Medication Policy on Herbal Medications  DESCRIPTION:  This patient's order for:  Biotin has been noted.  This product(s) is classified as an "herbal" or natural product. Due to a lack of definitive safety studies or FDA approval, nonstandard manufacturing practices, plus the potential risk of unknown drug-drug interactions while on inpatient medications, the Pharmacy and Therapeutics Committee does not permit the use of "herbal" or natural products of this type within [facility].   ACTION TAKEN: The pharmacy department is unable to verify this order at this time and your patient has been informed of this safety policy. Please reevaluate patient's clinical condition at discharge and address if the herbal or natural product(s) should be resumed at that time.  Diane Bryna Razavi, PharmD, BCPS, FCCP Clinical Pharmacist  

## 2020-01-30 NOTE — Op Note (Signed)
01/30/2020  5:00 PM  PATIENT:  Kelly Wall  59 y.o. female  PRE-OPERATIVE DIAGNOSIS:  Spondylolisthesis, Lumbar region L4/5, Lumbar stenosis L4/5, 5/S1 POST-OPERATIVE DIAGNOSIS:  Spondylolisthesis, Lumbar Region L4/5, Lumbar stenosis L4/5,5/S1 PROCEDURE:  Procedure(s): Posterior lumbar interbody arthrodesis L4/5, 5/1 Stryker Prolift cages filled with autograft morsels Lumbar laminectomy in excess of needed exposure for a PLIF L4, L5 Inferior facetectomies L4/5 Segmental posterior pedicle screw fixation Stryker Zia screws L4-S1  SURGEON:  Surgeon(s): Coletta Memos, MD Bedelia Person, MD  ASSISTANTS:thomas, Christiane Ha  ANESTHESIA:   general  EBL:  Total I/O In: 920 [I.V.:500; Blood:170; IV Piggyback:250] Out: 1500 [Urine:1100; Blood:400]  BLOOD ADMINISTERED:170 CC CELLSAVER  CELL SAVER GIVEN:yes  COUNT:per nursing  DRAINS: none   SPECIMEN:  No Specimen  DICTATION: Kelly Wall is a 59 y.o. female whom was taken to the operating room intubated, and placed under a general anesthetic without difficulty. A foley catheter was placed under sterile conditions. She was positioned prone on a Jackson stable with all pressure points properly padded.  Her lumbar region was prepped and draped in a sterile manner. I infiltrated 20cc's 1/2%lidocaine/1:2000,000 strength epinephrine into the planned incision. I opened the skin with a 10 blade and took the incision down to the thoracolumbar fascia. I exposed the lamina of L3,4,5,and S1 in a subperiosteal fashion bilaterally. I confirmed my location with an intraoperative xray.  I placed self retaining retractors and started the decompression.  I decompressed the spinal canal via complete laminectomies of L4,5 along with inferior facetectomies of L4, 5 bilaterally.The L4, L5 nerve roots were decompressed from the canal through the foramina bilaterally. I used the drill and Kerrison punches to remove the bone, and the ligamentum flavum.  The thecal sac was well decompressed. There was a pinhole opening in the dura repaired primarily in a water tight fashion.  PLIF's were performed at L4/5, and L5/S1 in the same fashion. I opened the disc space with a 15 blade then used a variety of instruments to remove the disc and prepare the space for the arthrodesis. I used curettes, rongeurs, punches, shavers for the disc space, and rasps in the discetomy. I measured the disc space and placed Prolift 8-59mm adjustable  cages(Stryker) into the disc space(s). The cages were packed with autograft morsels  We placed pedicle screws at L4,5, and S1, using fluoroscopic guidance. We drilled a pilot holes, then cannulated the pedicle with a probe at each site. We then tapped each pedicle, assessing each site for pedicle violations. No cutouts were appreciated. Screws (Stryker, Zia) were then placed at each site without difficulty. We attached rods and locking caps with the appropriate tools. The locking caps were secured with torque limited screwdrivers. Final films were performed and the final construct appeared to be in good position.  We closed the wound in a layered fashion. I did apply a dural sealant over the dural closure. I approximated the thoracolumbar fascia, subcutaneous, and subcuticular planes with vicryl sutures. I used dermabond and an occlusive bandage for a sterile dressing.     PLAN OF CARE: Admit to inpatient   PATIENT DISPOSITION:  PACU - hemodynamically stable.   Delay start of Pharmacological VTE agent (>24hrs) due to surgical blood loss or risk of bleeding:  yes

## 2020-01-30 NOTE — Transfer of Care (Signed)
Immediate Anesthesia Transfer of Care Note  Patient: Kelly Wall  Procedure(s) Performed: Lumbar Four-Five Lumbar Five Sacral One Posterior lumbar interbody fusion (N/A Spine Lumbar)  Patient Location: PACU  Anesthesia Type:General  Level of Consciousness: awake, alert  and oriented  Airway & Oxygen Therapy: Patient Spontanous Breathing and Patient connected to nasal cannula oxygen  Post-op Assessment: Report given to RN and Post -op Vital signs reviewed and stable  Post vital signs: Reviewed and stable  Last Vitals:  Vitals Value Taken Time  BP 100/61 01/30/20 1642  Temp 37.6 C 01/30/20 1630  Pulse 63 01/30/20 1643  Resp 14 01/30/20 1643  SpO2 96 % 01/30/20 1643  Vitals shown include unvalidated device data.  Last Pain:  Vitals:   01/30/20 1630  TempSrc:   PainSc: 0-No pain         Complications: No apparent anesthesia complications

## 2020-01-30 NOTE — Anesthesia Postprocedure Evaluation (Signed)
Anesthesia Post Note  Patient: Kelly Wall  Procedure(s) Performed: Lumbar Four-Five Lumbar Five Sacral One Posterior lumbar interbody fusion (N/A Spine Lumbar)     Patient location during evaluation: PACU Anesthesia Type: General Level of consciousness: awake and alert Pain management: pain level controlled Vital Signs Assessment: post-procedure vital signs reviewed and stable Respiratory status: spontaneous breathing, nonlabored ventilation, respiratory function stable and patient connected to nasal cannula oxygen Cardiovascular status: blood pressure returned to baseline and stable Postop Assessment: no apparent nausea or vomiting Anesthetic complications: no    Last Vitals:  Vitals:   01/30/20 1957 01/30/20 2000  BP: 115/71   Pulse: 62   Resp: 18   Temp:  36.6 C  SpO2: 99%     Last Pain:  Vitals:   01/30/20 2000  TempSrc:   PainSc: 4                  Turquoise Esch

## 2020-01-30 NOTE — Plan of Care (Signed)

## 2020-01-31 MED ORDER — DIAZEPAM 5 MG PO TABS: 5.0000 mg | ORAL_TABLET | Freq: Four times a day (QID) | ORAL | Status: DC | PRN

## 2020-01-31 MED ORDER — ENOXAPARIN SODIUM 40 MG/0.4ML ~~LOC~~ SOLN
40.0000 mg | SUBCUTANEOUS | Status: DC
  Administered 2020-01-31 – 2020-02-05 (×6): 40 mg via SUBCUTANEOUS
  Filled 2020-01-31 (×6): qty 0.4

## 2020-01-31 NOTE — Progress Notes (Signed)
Nurse assisted patient to ambulate in room with walker.

## 2020-01-31 NOTE — Progress Notes (Signed)
Orthopedic Tech Progress Note Patient Details:  [name] [birth year] [id number]  Patient ID: Kelly Wall, female   DOB: 1961/06/20, 59 y.o.   MRN: [id number] Pt already has brace  Trinna Post 01/31/2020, 5:59 AM

## 2020-01-31 NOTE — Evaluation (Signed)
Physical Therapy Evaluation Patient Details Name: Kelly Wall MRN: [id number] DOB: [birth year] Today's Date: 01/31/2020   History of Present Illness  Patient is a 59 y/o female s/p L4-5, L5-S1 PLIF on 01/30/20. PMH significant of ACDF C6-7, HTN, asthma, central cord syndrome C5.    Clinical Impression  Patient received in supine with good motivation to participate with PT. Patient reports she lives alone at baseline with friends available to check in on her. Patient today requiring min guard throughout session for bed mobility, transfers, and limited gait with use of RW. Cueing for back precautions throughout with good understanding. Patient up in chair at end of session with all needs met. PT to continue to follow acutely to progress safe and independent functional mobility prior to return home.     Follow Up Recommendations Follow surgeon's recommendation for DC plan and follow-up therapies;Home health PT;Supervision - Intermittent    Equipment Recommendations  3in1 (PT)    Recommendations for Other Services       Precautions / Restrictions Precautions Precautions: Back Precaution Booklet Issued: Yes (comment) Required Braces or Orthoses: Spinal Brace Spinal Brace: Lumbar corset;Applied in sitting position Restrictions Weight Bearing Restrictions: No      Mobility  Bed Mobility Overal bed mobility: Needs Assistance Bed Mobility: Supine to Sit     Supine to sit: Min guard     General bed mobility comments: min guard with cueing for log rolling  Transfers Overall transfer level: Needs assistance Equipment used: Rolling walker (2 wheeled) Transfers: Sit to/from [facility] Sit to Stand: Min guard Stand pivot transfers: Min guard       General transfer comment: min guard for initial standing balance; overall safety  Ambulation/Gait Ambulation/Gait assistance: Min guard Gait Distance (Feet): 50 Feet Assistive device: Rolling walker (2  wheeled) Gait Pattern/deviations: Step-to pattern;Decreased stride length Gait velocity: decreased   General Gait Details: mild unsteadiness with gait - patient reports a baseline of R LE weakness  Stairs            Wheelchair Mobility    Modified Rankin (Stroke Patients Only)       Balance Overall balance assessment: Needs assistance Sitting-balance support: No upper extremity supported;Feet supported Sitting balance-Leahy Scale: Good     Standing balance support: Bilateral upper extremity supported;During functional activity Standing balance-Leahy Scale: Fair Standing balance comment: reliant on RW for balance                             Pertinent Vitals/Pain Pain Assessment: 0-10 Pain Score: 4  Pain Location: back Pain Descriptors / Indicators: Aching;Discomfort;Guarding Pain Intervention(s): Limited activity within patient's tolerance;Monitored during session;Repositioned;Patient requesting pain meds-RN notified    Home Living Family/patient expects to be discharged to:: Private residence Living Arrangements: Alone Available Help at Discharge: Friend(s);Available PRN/intermittently Type of Home: Apartment Home Access: Level entry     Home Layout: One level Home Equipment: Walker - 2 wheels      Prior Function Level of Independence: Independent         Comments: works at [employer]        Extremity/Trunk Assessment   Upper Extremity Assessment Upper Extremity Assessment: Defer to OT evaluation    Lower Extremity Assessment Lower Extremity Assessment: Generalized weakness    Cervical / Trunk Assessment Cervical / Trunk Assessment: Normal  Communication   Communication: No difficulties  Cognition Arousal/Alertness: Awake/alert Behavior During Therapy: [facility] for  tasks assessed/performed Overall Cognitive Status: Within Functional Limits for tasks assessed                                         General Comments General comments (skin integrity, edema, etc.): dressing clean and dry    Exercises     Assessment/Plan    PT Assessment Patient needs continued PT services  PT Problem List Decreased strength;Decreased activity tolerance;Decreased balance;Decreased mobility;Decreased knowledge of use of DME;Decreased safety awareness       PT Treatment Interventions DME instruction;Gait training;Stair training;Functional mobility training;Therapeutic activities;Therapeutic exercise;Balance training;Patient/family education    PT Goals (Current goals can be found in the Care Plan section)  Acute Rehab PT Goals Patient Stated Goal: reduce pain, improve mobility PT Goal Formulation: With patient Time For Goal Achievement: 02/14/20 Potential to Achieve Goals: Good    Frequency Min 5X/week   Barriers to discharge        Co-evaluation               AM-PAC PT "6 Clicks" Mobility  Outcome Measure Help needed turning from your back to your side while in a flat bed without using bedrails?: A Little Help needed moving from lying on your back to sitting on the side of a flat bed without using bedrails?: A Little Help needed moving to and from a bed to a chair (including a wheelchair)?: A Little Help needed standing up from a chair using your arms (e.g., wheelchair or bedside chair)?: A Little Help needed to walk in hospital room?: A Little Help needed climbing 3-5 steps with a railing? : A Lot 6 Click Score: 17    End of Session Equipment Utilized During Treatment: Gait belt Activity Tolerance: Patient tolerated treatment well Patient left: in chair;with call bell/phone within reach Nurse Communication: Mobility status PT Visit Diagnosis: Unsteadiness on feet (R26.81);Other abnormalities of gait and mobility (R26.89);Muscle weakness (generalized) (M62.81)    Time: 9068-9340 PT Time Calculation (min) (ACUTE ONLY): 27 min   Charges:   PT Evaluation $PT Eval  Moderate Complexity: 1 Mod PT Treatments $Gait Training: 8-22 mins        Lanney Gins, PT, DPT Supplemental Physical Therapist 01/31/20 11:49 AM Pager: [phone] Office: [phone]

## 2020-01-31 NOTE — Plan of Care (Signed)
  Problem: Education: Goal: Knowledge of General Education information will improve Description: Including pain rating scale, medication(s)/side effects and non-pharmacologic comfort measures Outcome: Progressing   Problem: Activity: Goal: Risk for activity intolerance will decrease Outcome: Progressing   Problem: Nutrition: Goal: Adequate nutrition will be maintained Outcome: Progressing   Problem: Coping: Goal: Level of anxiety will decrease Outcome: Progressing   

## 2020-01-31 NOTE — Progress Notes (Signed)
Occupational Therapy Evaluation Patient Details Name: Kelly Wall MRN: [id number] DOB: [birth year] Today's Date: 01/31/2020    History of Present Illness Patient is a 59 y/o female s/p L4-5, L5-S1 PLIF on 01/30/20. PMH significant of ACDF C6-7, HTN, asthma, central cord syndrome C5.   Clinical Impression   Pt presents with above diagnosis. PTA pt PLOF living at home alone, mostly I with ADLs with use AE RN. Pt currently limited with safe ADL engagement due to pain, limited functional mobility, sensation, and activity tolerance. Pt will benefit from additional acute OT to address established deficits and continued practice for safety, AE education, and compensatory strategies. DC recommendation to MD recommendation with support and HHOT.    Follow Up Recommendations  Follow surgeon's recommendation for DC plan and follow-up therapies;Supervision - Intermittent;Home health OT    Equipment Recommendations  3 in 1 bedside commode    Recommendations for Other Services       Precautions / Restrictions Precautions Precautions: Back Precaution Booklet Issued: Yes (comment) Required Braces or Orthoses: Spinal Brace Spinal Brace: Lumbar corset;Applied in sitting position Restrictions Weight Bearing Restrictions: No      Mobility Bed Mobility Overal bed mobility: Needs Assistance Bed Mobility: Sit to Supine     Supine to sit: Min assist     General bed mobility comments: Min A to manage BLE back to bed. Pt able to lower in side lying to initiate log roll.  Transfers Overall transfer level: Needs assistance Equipment used: Rolling walker (2 wheeled) Transfers: Sit to/from [facility] Sit to Stand: Min guard Stand pivot transfers: Min guard       General transfer comment: min guard for initial standing balance; overall safety    Balance Overall balance assessment: Needs assistance Sitting-balance support: No upper extremity supported;Feet  supported Sitting balance-Leahy Scale: Good     Standing balance support: Bilateral upper extremity supported;During functional activity Standing balance-Leahy Scale: Fair Standing balance comment: reliant on RW for balance                           ADL either performed or assessed with clinical judgement   ADL Overall ADL's : Needs assistance/impaired Eating/Feeding: Modified independent;Sitting   Grooming: Wash/dry hands;Wash/dry face;Oral care;Set up;Min guard;Standing;Cueing for safety;Cueing for sequencing;Cueing for compensatory techniques Grooming Details (indicate cue type and reason): Pt educated of compensatory strategy for oral care at sink level. Pt able to follow commands, 1-2 reminders required.. Additional time required to manage items for oral care due to poor grip strength.  Upper Body Bathing: Supervision/ safety   Lower Body Bathing: Minimal assistance;Sitting/lateral leans;Cueing for safety;Cueing for sequencing;Cueing for back precautions   Upper Body Dressing : Supervision/safety;Sitting   Lower Body Dressing: Minimal assistance;Cueing for sequencing;Cueing for safety;Sitting/lateral leans;Adhering to back precautions;Cueing for back precautions;Cueing for compensatory techniques Lower Body Dressing Details (indicate cue type and reason): pt educated of figure 4 position for LB dressing of don/doff of sock Toilet Transfer: Min Government social research officer Details (indicate cue type and reason): Simulated toilet transfer from chair <> bed, Min a for intial power up to stand, and min guard for safety with use of RW.         Functional mobility during ADLs: Min guard;Rolling walker;Cueing for safety General ADL Comments: Pt will require additional AE and addiional practice for safe ADL engagement to adhere to "BLT"(back precautions) and don/doff of back brace     Vision  Perception     Praxis      Pertinent Vitals/Pain Pain  Assessment: 0-10 Pain Score: 5  Pain Location: back Pain Descriptors / Indicators: Aching;Discomfort;Guarding Pain Intervention(s): Limited activity within patient's tolerance;Monitored during session;Repositioned     Hand Dominance Right   Extremity/Trunk Assessment Upper Extremity Assessment Upper Extremity Assessment: RUE deficits/detail;LUE deficits/detail RUE Deficits / Details: Limited fine motor grip strength RUE Sensation: history of peripheral neuropathy LUE Sensation: history of peripheral neuropathy   Lower Extremity Assessment Lower Extremity Assessment: Defer to PT evaluation   Cervical / Trunk Assessment Cervical / Trunk Assessment: Normal   Communication Communication Communication: No difficulties   Cognition Arousal/Alertness: Awake/alert Behavior During Therapy: WFL for tasks assessed/performed Overall Cognitive Status: Within Functional Limits for tasks assessed                                     General Comments  All questions and need met regarding precautions of back.     Exercises     Shoulder Instructions      Home Living Family/patient expects to be discharged to:: Private residence Living Arrangements: Alone Available Help at Discharge: Friend(s);Available PRN/intermittently Type of Home: Apartment Home Access: Level entry     Home Layout: One level     Bathroom Shower/Tub: Teacher, early years/pre: Standard     Home Equipment: Environmental consultant - 2 wheels          Prior Functioning/Environment Level of Independence: Independent        Comments: works at [employer] Problem List: Decreased range of motion;Decreased activity tolerance;Impaired balance (sitting and/or standing);Decreased safety awareness;Decreased knowledge of use of DME or AE;Decreased knowledge of precautions;Pain;Impaired sensation      OT Treatment/Interventions: Self-care/ADL training;Therapeutic exercise;DME and/or AE  instruction;Therapeutic activities;Patient/family education;Balance training    OT Goals(Current goals can be found in the care plan section) Acute Rehab OT Goals Patient Stated Goal: reduce pain, improve mobility OT Goal Formulation: With patient Time For Goal Achievement: 02/14/20 Potential to Achieve Goals: Fair  OT Frequency: Min 2X/week   Barriers to D/C: Decreased caregiver support;Inaccessible home environment  Pt reports living alone and minimal AE to support for ADLs.       Co-evaluation              AM-PAC OT "6 Clicks" Daily Activity     Outcome Measure Help from another person eating meals?: A Little Help from another person taking care of personal grooming?: A Little Help from another person toileting, which includes using toliet, bedpan, or urinal?: A Little Help from another person bathing (including washing, rinsing, drying)?: A Little Help from another person to put on and taking off regular upper body clothing?: A Little Help from another person to put on and taking off regular lower body clothing?: A Little 6 Click Score: 18   End of Session Equipment Utilized During Treatment: Rolling walker;Back brace Nurse Communication: Mobility status;Weight bearing status;Precautions  Activity Tolerance: Patient tolerated treatment well Patient left: in bed;with bed alarm set;with call bell/phone within reach  OT Visit Diagnosis: Unsteadiness on feet (R26.81);Muscle weakness (generalized) (M62.81);Pain Pain - part of body: (back)                Time: 1005-1037 OT Time Calculation (min): 32 min Charges:  OT General Charges $OT Visit: 1 Visit OT Evaluation $OT Eval Low Complexity: 1  Low OT Treatments $Self Care/Home Management : 8-22 mins  Kelly Wall, MSOT, OTR/L  Supplemental Rehabilitation Services  [phone]  Marius Ditch 01/31/2020, 1:39 PM

## 2020-01-31 NOTE — Plan of Care (Signed)

## 2020-01-31 NOTE — Progress Notes (Signed)
Subjective: Patient reports NAEs o/n.  Objective: Vital signs in last 24 hours: Temp:  [97.6 F (36.4 C)-99.6 F (37.6 C)] 98.9 F (37.2 C) (03/06 0944) Pulse Rate:  [52-74] 60 (03/06 0944) Resp:  [10-18] 18 (03/06 0944) BP: (81-125)/(48-71) 107/67 (03/06 0944) SpO2:  [95 %-100 %] 100 % (03/06 0944) Weight:  [50.7 kg] 50.7 kg (03/06 0159)  Intake/Output from previous day: 03/05 0701 - 03/06 0700 In: 1325.5 [I.V.:905.5; Blood:170; IV Piggyback:250] Out: 2250 [Urine:1850; Blood:400] Intake/Output this shift: No intake/output data recorded.  Awake, alert, Ox3 FC x 4 5/5 strength KE, DF, PF bilaterally Incision c/d/i  Lab Results: Recent Labs    01/28/20 1329 01/28/20 1329 01/30/20 1826 01/30/20 1955  WBC 3.9*  --   --  9.5  HGB 13.9   < > 9.9* 9.6*  HCT 40.8   < > 29.0* 27.3*  PLT 288  --   --  151   < > = values in this interval not displayed.   BMET Recent Labs    01/28/20 1329 01/30/20 1826  NA 131* 136  K 3.6 4.3  CL 98 101  CO2 25  --   GLUCOSE 92 139*  BUN 10 6  CREATININE 0.54 0.50  CALCIUM 9.5  --     Studies/Results: DG Lumbar Spine 2-3 Views  Result Date: 01/30/2020 CLINICAL DATA:  Elective surgery EXAM: LUMBAR SPINE - 2-3 VIEW; DG C-ARM 1-60 MIN COMPARISON:  Lumbar MRI 08/24/2019 FINDINGS: Sequential fluoroscopic images depict posterior decompression and fusion L4-S1 with placement of bilateral transpedicular screws at L4, L5 and S1 with interbody spacer placement at L4-5 and L5-S1. L4-5 anterolisthesis is similar to comparison MRI. No acute hardware complication is seen IMPRESSION: Posterior decompression and fusion with interbody spacers at L4-S1. No acute complication. Unchanged L4-5 anterolisthesis. Electronically Signed   By: Kreg Shropshire M.D.   On: 01/30/2020 17:23   DG C-Arm 1-60 Min  Result Date: 01/30/2020 CLINICAL DATA:  Elective surgery EXAM: LUMBAR SPINE - 2-3 VIEW; DG C-ARM 1-60 MIN COMPARISON:  Lumbar MRI 08/24/2019 FINDINGS:  Sequential fluoroscopic images depict posterior decompression and fusion L4-S1 with placement of bilateral transpedicular screws at L4, L5 and S1 with interbody spacer placement at L4-5 and L5-S1. L4-5 anterolisthesis is similar to comparison MRI. No acute hardware complication is seen IMPRESSION: Posterior decompression and fusion with interbody spacers at L4-S1. No acute complication. Unchanged L4-5 anterolisthesis. Electronically Signed   By: Kreg Shropshire M.D.   On: 01/30/2020 17:23    Assessment/Plan: S/p L4-S1 decompression and fusion - OOB with assist - PT - pain control with percocet - lovenox today for dvt prophylaxis   Kelly Wall 01/31/2020, 11:02 AM

## 2020-02-01 NOTE — Progress Notes (Signed)
Subjective: Patient reports soreness in back.   Objective: Vital signs in last 24 hours: Temp:  [98.2 F (36.8 C)-99.7 F (37.6 C)] 98.8 F (37.1 C) (03/07 1011) Pulse Rate:  [70-87] 87 (03/07 1011) Resp:  [17-18] 18 (03/07 1011) BP: (102-119)/(55-74) 112/74 (03/07 1011) SpO2:  [97 %-100 %] 100 % (03/07 1011)  Intake/Output from previous day: No intake/output data recorded. Intake/Output this shift: No intake/output data recorded.   Sitting in chair Awake, alert, Ox3 FC x 4 5/5 strength KE, DF, PF bilaterally Incision c/d/i  Lab Results: Recent Labs    01/30/20 1826 01/30/20 1955  WBC  --  9.5  HGB 9.9* 9.6*  HCT 29.0* 27.3*  PLT  --  151   BMET Recent Labs    01/30/20 1826  NA 136  K 4.3  CL 101  GLUCOSE 139*  BUN 6  CREATININE 0.50    Studies/Results: DG Lumbar Spine 2-3 Views  Result Date: 01/30/2020 CLINICAL DATA:  Elective surgery EXAM: LUMBAR SPINE - 2-3 VIEW; DG C-ARM 1-60 MIN COMPARISON:  Lumbar MRI 08/24/2019 FINDINGS: Sequential fluoroscopic images depict posterior decompression and fusion L4-S1 with placement of bilateral transpedicular screws at L4, L5 and S1 with interbody spacer placement at L4-5 and L5-S1. L4-5 anterolisthesis is similar to comparison MRI. No acute hardware complication is seen IMPRESSION: Posterior decompression and fusion with interbody spacers at L4-S1. No acute complication. Unchanged L4-5 anterolisthesis. Electronically Signed   By: Kreg Shropshire M.D.   On: 01/30/2020 17:23   DG C-Arm 1-60 Min  Result Date: 01/30/2020 CLINICAL DATA:  Elective surgery EXAM: LUMBAR SPINE - 2-3 VIEW; DG C-ARM 1-60 MIN COMPARISON:  Lumbar MRI 08/24/2019 FINDINGS: Sequential fluoroscopic images depict posterior decompression and fusion L4-S1 with placement of bilateral transpedicular screws at L4, L5 and S1 with interbody spacer placement at L4-5 and L5-S1. L4-5 anterolisthesis is similar to comparison MRI. No acute hardware complication is seen  IMPRESSION: Posterior decompression and fusion with interbody spacers at L4-S1. No acute complication. Unchanged L4-5 anterolisthesis. Electronically Signed   By: Kreg Shropshire M.D.   On: 01/30/2020 17:23    Assessment/Plan: S/p L4-S1 decompression and fusion - OOB with assist - PT - pain control with percocet - cont lovenox   Kelly Wall 02/01/2020, 11:25 AM

## 2020-02-01 NOTE — Progress Notes (Signed)
Occupational Therapy Treatment Patient Details Name: Kelly Wall MRN: [id number] DOB: 1961/01/17 Today's Date: 02/01/2020    History of present illness Patient is a 59 y/o female s/p L4-5, L5-S1 PLIF on 01/30/20. PMH significant of ACDF C6-7, HTN, asthma, central cord syndrome C5.   OT comments  Pt eager to work with therapy, able to don brace EOB with min guard (educated on tips for don/doff), min guard for transfers, and min guard for standing grooming at sink. Verbally reviewed AE for LB ADL and rear peri care. Pt remains motivated and current POC remains appropriate.    Follow Up Recommendations  Follow surgeon's recommendation for DC plan and follow-up therapies;Supervision - Intermittent;Home health OT    Equipment Recommendations  3 in 1 bedside commode    Recommendations for Other Services      Precautions / Restrictions Precautions Precautions: Back Precaution Booklet Issued: Yes (comment) Required Braces or Orthoses: Spinal Brace Spinal Brace: Lumbar corset;Applied in sitting position Restrictions Weight Bearing Restrictions: No       Mobility Bed Mobility Overal bed mobility: Needs Assistance Bed Mobility: Rolling;Sidelying to Sit;Sit to Sidelying Rolling: Supervision Sidelying to sit: Min assist;HOB elevated     Sit to sidelying: Min assist General bed mobility comments: Min A to manage BLE back to bed. Pt able to lower in side lying to initiate log roll.  Transfers Overall transfer level: Needs assistance Equipment used: Rolling walker (2 wheeled) Transfers: Sit to/from Stand Sit to Stand: Min guard         General transfer comment: min guard for initial standing balance; overall safety    Balance Overall balance assessment: Needs assistance Sitting-balance support: No upper extremity supported;Feet supported Sitting balance-Leahy Scale: Good     Standing balance support: Bilateral upper extremity supported;During functional  activity Standing balance-Leahy Scale: Fair Standing balance comment: reliant on RW for balance                           ADL either performed or assessed with clinical judgement   ADL Overall ADL's : Needs assistance/impaired     Grooming: Wash/dry hands;Set up;Min guard;Standing;Cueing for safety;Cueing for sequencing;Cueing for compensatory techniques Grooming Details (indicate cue type and reason): sink level, cues for back precautions and implementing compensatory strategies from yesterday's session Upper Body Bathing: Supervision/ safety       Upper Body Dressing : Sitting;Min guard Upper Body Dressing Details (indicate cue type and reason): to don brace sitting EOB Lower Body Dressing: Sitting/lateral leans;Adhering to back precautions;Cueing for back precautions;Cueing for compensatory techniques;Moderate assistance Lower Body Dressing Details (indicate cue type and reason): pt educated of figure 4 position for LB dressing of don/doff of sock Toilet Transfer: Min guard;Minimal assistance;Ambulation;RW Toilet Transfer Details (indicate cue type and reason): use of RW for pain management and balance Toileting- Clothing Manipulation and Hygiene: Moderate assistance Toileting - Clothing Manipulation Details (indicate cue type and reason): cues for back precautions, Pt will need assist for rear peri care (verbally educated in AE)     Functional mobility during ADLs: Min guard;Rolling walker;Cueing for safety General ADL Comments: progressing towards OT goals this session     Vision       Perception     Praxis      Cognition Arousal/Alertness: Awake/alert Behavior During Therapy: WFL for tasks assessed/performed Overall Cognitive Status: Within Functional Limits for tasks assessed  Exercises     Shoulder Instructions       General Comments verbally introduced AE for LB ADL and rear peri care     Pertinent Vitals/ Pain       Pain Assessment: 0-10 Pain Score: 5  Pain Location: back Pain Descriptors / Indicators: Aching;Discomfort;Guarding Pain Intervention(s): Limited activity within patient's tolerance;Monitored during session;Repositioned  Home Living                                          Prior Functioning/Environment              Frequency  Min 2X/week        Progress Toward Goals  OT Goals(current goals can now be found in the care plan section)  Progress towards OT goals: Progressing toward goals  Acute Rehab OT Goals Patient Stated Goal: reduce pain, improve mobility OT Goal Formulation: With patient Time For Goal Achievement: 02/14/20 Potential to Achieve Goals: Fair  Plan Discharge plan remains appropriate;Frequency remains appropriate    Co-evaluation                 AM-PAC OT "6 Clicks" Daily Activity     Outcome Measure   Help from another person eating meals?: A Little Help from another person taking care of personal grooming?: A Little Help from another person toileting, which includes using toliet, bedpan, or urinal?: A Little Help from another person bathing (including washing, rinsing, drying)?: A Little Help from another person to put on and taking off regular upper body clothing?: A Little Help from another person to put on and taking off regular lower body clothing?: A Little 6 Click Score: 18    End of Session Equipment Utilized During Treatment: Rolling walker;Back brace;Gait belt  OT Visit Diagnosis: Unsteadiness on feet (R26.81);Muscle weakness (generalized) (M62.81);Pain Pain - part of body: (back)   Activity Tolerance Patient tolerated treatment well   Patient Left in bed;with bed alarm set;with call bell/phone within reach   Nurse Communication Mobility status;Precautions        Time: 7262-0355 OT Time Calculation (min): 28 min  Charges: OT General Charges $OT Visit: 1 Visit OT  Treatments $Self Care/Home Management : 23-37 mins  Nyoka Cowden OTR/L Acute Rehabilitation Services Pager: [phone] Office: [phone]   Evern Bio Sarenity Ramaker 02/01/2020, 2:36 PM

## 2020-02-01 NOTE — Plan of Care (Signed)

## 2020-02-01 NOTE — Progress Notes (Signed)
Physical Therapy Treatment Patient Details Name: Kelly Wall MRN: [id number] DOB: Feb 04, 1961 Today's Date: 02/01/2020    History of Present Illness Patient is a 59 y/o female s/p L4-5, L5-S1 PLIF on 01/30/20. PMH significant of ACDF C6-7, HTN, asthma, central cord syndrome C5.    PT Comments    Tx focused on gait progression, education for pain management, and functional transfers. Pt reports being very motivated to work with therapy today, stated her MD told her she only needs to wear the brace when she wants currently for pain. Pt required further education to wear brace when seated for extended periods of time and when walking distances longer than BR. Will continue to test precautions during follow up sessions. Pt was able to ambulate much further today with reduced gait speed secondary to pain. She demonstrated improving gait throughout with improved heel strike and posture with verbal cues. Pt able to perform log roll with minimal VCs at start of session. Pt transferred to recliner at end of session with friend presant. PT will continue following acutely to improve LE strength, gait tolerance, and address deficits listed below.   Follow Up Recommendations  Follow surgeon's recommendation for DC plan and follow-up therapies;Home health PT;Supervision - Intermittent     Equipment Recommendations  3in1 (PT)       Precautions / Restrictions Precautions Precautions: Back Precaution Booklet Issued: Yes (comment) Required Braces or Orthoses: Spinal Brace Spinal Brace: Lumbar corset;Applied in sitting position(Don/wear in sitting, can ambulate without to BR.) Restrictions Weight Bearing Restrictions: No    Mobility  Bed Mobility Overal bed mobility: Needs Assistance Bed Mobility: Rolling;Sidelying to Sit;Sit to Sidelying Rolling: Supervision Sidelying to sit: Min assist;HOB elevated     Sit to sidelying: Min assist General bed mobility comments: Pt required MinA to manage  torso when transitioning from supine to sitting. Pt able to perform log roll with minimal cueing today.  Transfers Overall transfer level: Needs assistance Equipment used: Rolling walker (2 wheeled) Transfers: Sit to/from Stand Sit to Stand: Min guard         General transfer comment: minG for initial standing balance and safety due to instability  Ambulation/Gait Ambulation/Gait assistance: Min guard;Supervision Gait Distance (Feet): 180 Feet Assistive device: Rolling walker (2 wheeled) Gait Pattern/deviations: Step-to pattern;Decreased stride length Gait velocity: decreased   General Gait Details: mild unsteadiness with gait - patient reports a baseline of R LE weakness     Balance Overall balance assessment: Needs assistance Sitting-balance support: No upper extremity supported;Feet supported Sitting balance-Leahy Scale: Good     Standing balance support: Bilateral upper extremity supported;During functional activity Standing balance-Leahy Scale: Fair Standing balance comment: reliant on RW for balance        Cognition Arousal/Alertness: Awake/alert Behavior During Therapy: WFL for tasks assessed/performed Overall Cognitive Status: Within Functional Limits for tasks assessed       Exercises      General Comments General comments (skin integrity, edema, etc.): Pt reports pain with ambulation primarily R side of back down to R back of knee as "pulling". During ambulation patient was educated on moderate to severe pain management with gentle ROM for hip muscles. Pt verbalized understanding and performed 10x hip abuduction.      Pertinent Vitals/Pain Pain Assessment: 0-10 Pain Score: 3  Pain Location: back Pain Descriptors / Indicators: Aching;Discomfort;Guarding Pain Intervention(s): Limited activity within patient's tolerance;Repositioned;Monitored during session           PT Goals (current goals can now be found in the care  plan section) Acute Rehab PT  Goals Patient Stated Goal: reduce pain, improve mobility PT Goal Formulation: With patient Time For Goal Achievement: 02/14/20 Potential to Achieve Goals: Good Progress towards PT goals: Progressing toward goals    Frequency    Min 5X/week      PT Plan Current plan remains appropriate    Co-evaluation              AM-PAC PT "6 Clicks" Mobility   Outcome Measure  Help needed turning from your back to your side while in a flat bed without using bedrails?: A Little Help needed moving from lying on your back to sitting on the side of a flat bed without using bedrails?: A Little Help needed moving to and from a bed to a chair (including a wheelchair)?: A Little Help needed standing up from a chair using your arms (e.g., wheelchair or bedside chair)?: A Little Help needed to walk in hospital room?: A Little Help needed climbing 3-5 steps with a railing? : A Lot 6 Click Score: 17    End of Session Equipment Utilized During Treatment: Gait belt Activity Tolerance: Patient tolerated treatment well Patient left: in chair;with call bell/phone within reach;with family/visitor present Nurse Communication: Mobility status PT Visit Diagnosis: Unsteadiness on feet (R26.81);Other abnormalities of gait and mobility (R26.89);Muscle weakness (generalized) (M62.81)     Time: 5188-4166 PT Time Calculation (min) (ACUTE ONLY): 26 min  Charges:  $Gait Training: 8-22 mins $Therapeutic Activity: 8-22 mins                     Oda Cogan PT, DPT Acute Rehab [facility] P: [phone]    Koral Thaden A Keylin Ferryman 02/01/2020, 3:31 PM

## 2020-02-01 NOTE — Plan of Care (Signed)
?  Problem: Education: ?Goal: Knowledge of General Education information will improve ?Description: Including pain rating scale, medication(s)/side effects and non-pharmacologic comfort measures ?Outcome: Progressing ?  ?Problem: Clinical Measurements: ?Goal: Ability to maintain clinical measurements within normal limits will improve ?Outcome: Progressing ?  ?Problem: Nutrition: ?Goal: Adequate nutrition will be maintained ?Outcome: Progressing ?  ?Problem: Coping: ?Goal: Level of anxiety will decrease ?Outcome: Progressing ?  ?Problem: Elimination: ?Goal: Will not experience complications related to bowel motility ?Outcome: Progressing ?  ?

## 2020-02-01 NOTE — Progress Notes (Signed)
Pt refused 10 mg of baclofen, pt states she only takes 2.5mg  at home because anything more will "knock her out". Pt states she also takes 100 mg flexeril twice a day at home for her muscle spasms and requests to receive that during stay. Pt resting well at the moment, will relay message to day shift nurse. Will continue to monitor pt.

## 2020-02-02 NOTE — Progress Notes (Signed)
Physical Therapy Treatment Patient Details Name: Kelly Wall MRN: [id number] DOB: [birth year] Today's Date: 02/02/2020    History of Present Illness Patient is a 59 y/o female s/p L4-5, L5-S1 PLIF on 01/30/20. PMH significant of ACDF C6-7, HTN, asthma, central cord syndrome C5.    PT Comments    Pt found sitting in recliner upon approach without lumbar brace donned. Donned lumbar brace and educated patient of orders for brace secondary to pain levels. Pt demonstrated high pain upon initial approach. Donned lumbar brace, performed relaxation/deep breathing exercises, distraction, request for pain medication from RN (given before ambulation).  Pt continues to ambulate same distance (to window at end of hall) but performed much slower today, with improved gait. She demonstrated improved step through pattern, heel strike through toe off bilaterally. Will need to focus more on gait speed next session. Pt can continue to benefit from skilled therapy to progress strengthening as tolerated, and improve mobility prior to transition to next level of care.  Follow Up Recommendations  Follow surgeon's recommendation for DC plan and follow-up therapies;Home health PT;Supervision - Intermittent     Equipment Recommendations  3in1 (PT)       Precautions / Restrictions Precautions Precautions: Back Precaution Booklet Issued: Yes (comment) Required Braces or Orthoses: Spinal Brace Spinal Brace: Lumbar corset;Applied in sitting position(Don/wear in sitting, can ambulate without to BR.) Restrictions Weight Bearing Restrictions: No    Mobility  Bed Mobility     General bed mobility comments: Pt OOB upon approach  Transfers Overall transfer level: Needs assistance Equipment used: Rolling walker (2 wheeled) Transfers: Sit to/from Stand Sit to Stand: Min guard;Supervision         General transfer comment: minG for initial standing balance and safety due to  instability  Ambulation/Gait Ambulation/Gait assistance: Min guard;Supervision Gait Distance (Feet): 180 Feet Assistive device: Rolling walker (2 wheeled) Gait Pattern/deviations: Step-through pattern Gait velocity: decreased Gait velocity interpretation: <1.31 ft/sec, indicative of household ambulator General Gait Details: minimal unsteadiness noted today. Pt demonstrates very slow gait speed today.     Balance Overall balance assessment: Needs assistance Sitting-balance support: No upper extremity supported;Feet supported Sitting balance-Leahy Scale: Good     Standing balance support: Bilateral upper extremity supported;During functional activity Standing balance-Leahy Scale: Fair Standing balance comment: reliant on RW for balance      Cognition Arousal/Alertness: Awake/alert Behavior During Therapy: WFL for tasks assessed/performed Overall Cognitive Status: Within Functional Limits for tasks assessed          Exercises Other Exercises Other Exercises: Relaxation breathing (4/4/4) 2 min performance. Other Exercises: Coordination (finger to thumb -  5 x ea hand) Other Exercises: ankle pumps in sitting x 30    General Comments General comments (skin integrity, edema, etc.): Pt requests pain medication upon approach. Donned back brace in sitting (full assist), worked on deep breathing (4/4/4 timing), and called RN for pain medication. Pt reports reduction of pain throughout tx demonstrating slow gait and ability to perform toileting safely with supervision today.      Pertinent Vitals/Pain Pain Assessment: 0-10 Pain Score: 8  Pain Location: back Pain Descriptors / Indicators: Aching;Discomfort;Guarding Pain Intervention(s): Patient requesting pain meds-RN notified;Repositioned;Utilized relaxation techniques;Monitored during session           PT Goals (current goals can now be found in the care plan section) Acute Rehab PT Goals Patient Stated Goal: reduce pain,  improve mobility PT Goal Formulation: With patient Time For Goal Achievement: 02/14/20 Potential to Achieve Goals: Good Progress towards PT  goals: Progressing toward goals    Frequency    Min 5X/week      PT Plan Current plan remains appropriate       AM-PAC PT "6 Clicks" Mobility   Outcome Measure  Help needed turning from your back to your side while in a flat bed without using bedrails?: A Little Help needed moving from lying on your back to sitting on the side of a flat bed without using bedrails?: A Little Help needed moving to and from a bed to a chair (including a wheelchair)?: A Little Help needed standing up from a chair using your arms (e.g., wheelchair or bedside chair)?: A Little Help needed to walk in hospital room?: A Little Help needed climbing 3-5 steps with a railing? : A Lot 6 Click Score: 17    End of Session Equipment Utilized During Treatment: Gait belt Activity Tolerance: Patient limited by pain Patient left: in chair;with chair alarm set Nurse Communication: Mobility status PT Visit Diagnosis: Unsteadiness on feet (R26.81);Other abnormalities of gait and mobility (R26.89);Muscle weakness (generalized) (M62.81)     Time: 0917-1020 PT Time Calculation (min) (ACUTE ONLY): 63 min  Charges:  $Gait Training: 23-37 mins $Therapeutic Exercise: 8-22 mins $Therapeutic Activity: 8-22 mins                     Oda Cogan PT, DPT Acute Rehab [facility] P: [phone]    Christabella Alvira A Srijan Givan 02/02/2020, 10:28 AM

## 2020-02-02 NOTE — Progress Notes (Signed)
Physical Therapy Treatment Patient Details Name: Kelly Wall MRN: [id number] DOB: 02/24/1961 Today's Date: 02/02/2020    History of Present Illness Patient is a 59 y/o female s/p L4-5, L5-S1 PLIF on 01/30/20. PMH significant of ACDF C6-7, HTN, asthma, central cord syndrome C5.    PT Comments    Tx focused on improved gait speed, bed mobility, and functional transfers. Pt required no verbal cues today for log roll/assistance with bed mobility, meeting goals for independence with low level transfers. She was able to demonstrate improved gait tolerance and ability with improved speed by more than 120% of the mornings speed, with good gait mechanics. She continues to require intermittent cues for RLE management and encouragement for improved posture during gait. Overall she's making steady progress with functional mobility, could consider CIR for improved independence due to living alone with no family for assistance or ability to get physical support. PT will continue following to address below listed deficits.    Follow Up Recommendations  Follow surgeon's recommendation for DC plan and follow-up therapies;CIR;Supervision - Intermittent(HHPT VS CIR)     Equipment Recommendations  3in1 (PT)    Recommendations for Other Services Rehab consult(PT reports being a pt with Dr. Riley Kill)     Precautions / Restrictions Precautions Precautions: Back Precaution Booklet Issued: Yes (comment) Required Braces or Orthoses: Spinal Brace Spinal Brace: Lumbar corset;Applied in sitting position(Don/wear in sitting, can ambulate without to BR.) Restrictions Weight Bearing Restrictions: No    Mobility  Bed Mobility Overal bed mobility: Modified Independent Bed Mobility: Rolling;Sidelying to Sit;Sit to Sidelying Rolling: Modified independent (Device/Increase time) Sidelying to sit: Supervision Supine to sit: Supervision   Sit to sidelying: Supervision General bed mobility comments: Pt required no  cueing today for log roll technique or HOB raised/bars for mobility  Transfers Overall transfer level: Needs assistance Equipment used: Rolling walker (2 wheeled) Transfers: Sit to/from Stand Sit to Stand: Supervision Stand pivot transfers: Supervision       General transfer comment: supervision for initial standing balance and safety  Ambulation/Gait Ambulation/Gait assistance: Supervision Gait Distance (Feet): 200 Feet Assistive device: Rolling walker (2 wheeled) Gait Pattern/deviations: Step-through pattern;Narrow base of support Gait velocity: decreased   General Gait Details: Pt demonstrated much improved gait speed >130% of earlier      Balance Overall balance assessment: Needs assistance Sitting-balance support: No upper extremity supported;Feet supported Sitting balance-Leahy Scale: Good     Standing balance support: Bilateral upper extremity supported;During functional activity Standing balance-Leahy Scale: Fair Standing balance comment: reliant on RW for balance secondary to pain        Cognition Arousal/Alertness: Awake/alert Behavior During Therapy: WFL for tasks assessed/performed Overall Cognitive Status: Within Functional Limits for tasks assessed           General Comments General comments (skin integrity, edema, etc.): Pt demonstrated improved pain control during afternoon session, requesting pain medication but much reduced grimmacing and visible symptoms.      Pertinent Vitals/Pain Pain Assessment: 0-10 Pain Score: 6  Pain Location: back Pain Descriptors / Indicators: Aching;Discomfort;Guarding Pain Intervention(s): Patient requesting pain meds-RN notified;Repositioned;Monitored during session           PT Goals (current goals can now be found in the care plan section) Acute Rehab PT Goals Patient Stated Goal: reduce pain, improve mobility PT Goal Formulation: With patient Time For Goal Achievement: 02/14/20 Potential to Achieve Goals:  Good Progress towards PT goals: Progressing toward goals    Frequency    Min 5X/week  PT Plan Discharge plan needs to be updated       AM-PAC PT "6 Clicks" Mobility   Outcome Measure  Help needed turning from your back to your side while in a flat bed without using bedrails?: A Little Help needed moving from lying on your back to sitting on the side of a flat bed without using bedrails?: A Little Help needed moving to and from a bed to a chair (including a wheelchair)?: A Little Help needed standing up from a chair using your arms (e.g., wheelchair or bedside chair)?: A Little Help needed to walk in hospital room?: A Little Help needed climbing 3-5 steps with a railing? : A Lot 6 Click Score: 17    End of Session   Activity Tolerance: Patient limited by pain Patient left: in chair;with chair alarm set Nurse Communication: Mobility status PT Visit Diagnosis: Unsteadiness on feet (R26.81);Other abnormalities of gait and mobility (R26.89);Muscle weakness (generalized) (M62.81)     Time: 3704-8889 PT Time Calculation (min) (ACUTE ONLY): 34 min  Charges:  $Gait Training: 8-22 mins $Therapeutic Activity: 8-22 mins                     Ann Held PT, DPT Acute Rehab [place] P: [place] 02/02/2020, 5:01 PM

## 2020-02-02 NOTE — Progress Notes (Signed)
Patient ID: Kelly Wall, female   DOB: 1961/01/24, 59 y.o.   MRN: [id number] BP 122/79 (BP Location: Left Arm)   Pulse 72   Temp 98.6 F (37 C)   Resp 17   Ht 5\' 1"  (1.549 m)   Wt 50.7 kg   SpO2 100%   BMI 21.11 kg/m  Alert and oriented x 4 Moving lower extremities Wound dressing is dry, no signs of infection Will be going to stay with a friend, who needs another day to reconfigure garage apt. Will plan on hhpt/ot

## 2020-02-02 NOTE — Plan of Care (Signed)
  Problem: Pain Managment: Goal: General experience of comfort will improve Outcome: Progressing   
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
## 2020-02-03 NOTE — Progress Notes (Signed)
Physical Therapy Treatment Patient Details Name: Kelly Wall MRN: [id number] DOB: [birth year] Today's Date: 02/03/2020    History of Present Illness Patient is a 59 y/o female s/p L4-5, L5-S1 PLIF on 01/30/20. PMH significant of ACDF C6-7, HTN, asthma, central cord syndrome C5.    PT Comments    Tx focused on further progression of gait speed, education for MSK pain, and donning lumbar support. Pt demonstrated improved assistance requiring modA for donning lumbar support today. She continues to request assistance for initial positioning. Pt demonstrated decreased light touch sensation on RLE toes 1&3 with increased sensation for 2&4 compared to PTA. Pt continues to report intermittent numbness areas, feeling like there was edema in R quadrant of LB with nothing being apparent and equal bilaterally visually. Pt continues to require intermittent cues for posture and gait mechanics, but has improved significantly in the last couple of days with less scissoring and improved step length. Pt will benefit from skilled PT to increase their independence and safety with mobility to allow discharge to the venue listed below.   Plan to focus on education for red flag symptoms vs MSK pain, stair training, and HEP.   Follow Up Recommendations  Follow surgeon's recommendation for DC plan and follow-up therapies;CIR;Supervision - Intermittent     Equipment Recommendations  3in1 (PT);Rolling walker with 5" wheels    Recommendations for Other Services       Precautions / Restrictions Precautions Precautions: Back Precaution Booklet Issued: Yes (comment) Required Braces or Orthoses: Spinal Brace Spinal Brace: Lumbar corset;Applied in sitting position(Don/wear in sitting, can ambulate without to BR.) Restrictions Weight Bearing Restrictions: No Other Position/Activity Restrictions: no bending, no lifting, no twisting    Mobility  Bed Mobility Overal bed mobility: Modified Independent              General bed mobility comments: pt demonstrated appropriate log rolling technique  Transfers Overall transfer level: Needs assistance Equipment used: Rolling walker (2 wheeled) Transfers: Sit to/from Stand Sit to Stand: Supervision         General transfer comment: Supervision for safety with toilet transfers from regular height toilet  Ambulation/Gait Ambulation/Gait assistance: Supervision Gait Distance (Feet): 250 Feet Assistive device: Rolling walker (2 wheeled) Gait Pattern/deviations: Step-through pattern;Narrow base of support Gait velocity: decreased   General Gait Details: Pt demonstrated improved gait speed from AM, did not ambulate same distance to assess fatigue/DOMS in am session.     Balance Overall balance assessment: Needs assistance Sitting-balance support: No upper extremity supported;Feet supported Sitting balance-Leahy Scale: Good     Standing balance support: Single extremity supported;During functional activity Standing balance-Leahy Scale: Fair Standing balance comment: single UE for balance secondary to pain during ADL completion          Cognition Arousal/Alertness: Awake/alert Behavior During Therapy: WFL for tasks assessed/performed Overall Cognitive Status: Within Functional Limits for tasks assessed         General Comments: pt is highly motivated and demonstrates great carryover of strategies and education      Exercises      General Comments General comments (skin integrity, edema, etc.): VSS; pt educated on further pain management strategies, relaxation, normal signs for MSK symptoms.       Pertinent Vitals/Pain Pain Assessment: Faces Faces Pain Scale: Hurts little more Pain Location: back Pain Descriptors / Indicators: Aching;Grimacing Pain Intervention(s): Limited activity within patient's tolerance;Repositioned;Relaxation           PT Goals (current goals can now be found in the care  plan section) Acute Rehab PT  Goals Patient Stated Goal: reduce pain, improve mobility PT Goal Formulation: With patient Time For Goal Achievement: 02/14/20 Potential to Achieve Goals: Good Progress towards PT goals: Progressing toward goals    Frequency    Min 5X/week      PT Plan Current plan remains appropriate       AM-PAC PT "6 Clicks" Mobility   Outcome Measure  Help needed turning from your back to your side while in a flat bed without using bedrails?: None Help needed moving from lying on your back to sitting on the side of a flat bed without using bedrails?: None Help needed moving to and from a bed to a chair (including a wheelchair)?: A Little Help needed standing up from a chair using your arms (e.g., wheelchair or bedside chair)?: A Little Help needed to walk in hospital room?: A Little Help needed climbing 3-5 steps with a railing? : A Little 6 Click Score: 20    End of Session Equipment Utilized During Treatment: Gait belt Activity Tolerance: Patient tolerated treatment well Patient left: in chair;with chair alarm set;with call bell/phone within reach Nurse Communication: Mobility status PT Visit Diagnosis: Unsteadiness on feet (R26.81);Other abnormalities of gait and mobility (R26.89);Muscle weakness (generalized) (M62.81)     Time: 7654-6503 PT Time Calculation (min) (ACUTE ONLY): 36 min  Charges:  $Gait Training: 8-22 mins $Therapeutic Activity: 8-22 mins                     Oda Cogan PT, DPT Acute Rehab [facility] P: [phone]    Jahn Franchini A Yitzchak Kothari 02/03/2020, 5:28 PM

## 2020-02-03 NOTE — Plan of Care (Signed)

## 2020-02-03 NOTE — TOC Transition Note (Addendum)
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 S/p lumbar interbody fusion 3/5. From home alone.No family supports.Daughter lives in [place].  PTA states independent with ADL's, no DME usage.  Pt states once d/c she will d/c to friend's home: Dr.Ballen/Naomi [address], [place], [address]. Pt agreeable to home health services. No preference. Per MD's note plan is to d/c with HHPT/OT.   TOC team will continue to monitor and follow for needs.... Pt's cell [phone]   02/04/2020 1430  Referrals made with [facility], [facility] for home health services and pt was declined. Referral made with [facility] HH, response pending.....   02/04/2020  1530  Pt declined for [facility] services with [facility]. [facility] reviewing pt for [facility] services. .....    Barriers to Discharge: Continued Medical Work up   Patient Goals and CMS Choice        Discharge Placement                       Discharge Plan and Services                                     Social Determinants of Health (SDOH) Interventions     Readmission Risk Interventions No flowsheet data found.

## 2020-02-03 NOTE — Progress Notes (Signed)
Patient ID: Kelly Wall, female   DOB: 1961-10-27, 59 y.o.   MRN: [id number] BP 113/73 (BP Location: Left Arm)   Pulse 76   Temp 99.1 F (37.3 C) (Oral)   Resp 17   Ht 5\' 1"  (1.549 m)   Wt 50.7 kg   SpO2 100%   BMI 21.11 kg/m  Alert and oriented x 4. Speech is clear and fluent Moving lower extremities well  Participating with PT and OT Discharge thursday

## 2020-02-03 NOTE — Progress Notes (Signed)
Physical Therapy Treatment Patient Details Name: Kelly Wall MRN: [id number] DOB: [birth year] Today's Date: 02/03/2020    History of Present Illness Patient is a 59 y/o female s/p L4-5, L5-S1 PLIF on 01/30/20. PMH significant of ACDF C6-7, HTN, asthma, central cord syndrome C5.    PT Comments    Tx focused on gait progression, endurance, and continued education for relaxation strategies and rationale. Pt was agreeable to therapy today. Pt was OOB upon approach without brace donned. Donned brace prior to ambulation today requiring maxA. She demonstrated much improved initial standing balance demonstrating safety with supervision only today, no near LOB demonstrated during standing or gait.  She continues to report moderate to high pain levels, reporting pre-medicated around 6am today. She demonstrated 3x gait speed from 2 days ago being able to ambulate much further with no increase in symptoms.   Educated patient on stair negotiation with RW today for consideration prior to practice at next session. Pt will benefit from skilled PT to increase their independence and safety with mobility to allow discharge to the venue listed below.     She continues to require additional education on MSK symptoms vs redflags and stair training for transition to friend's home.    Follow Up Recommendations  Follow surgeon's recommendation for DC plan and follow-up therapies;CIR;Supervision - Intermittent     Equipment Recommendations  3in1 (PT);Rolling walker with 5" wheels    Recommendations for Other Services       Precautions / Restrictions Precautions Precautions: Back Required Braces or Orthoses: Spinal Brace Spinal Brace: Lumbar corset;Applied in sitting position( Don/wear in sitting, can ambulate without to BR.) Restrictions Weight Bearing Restrictions: No    Mobility  Bed Mobility Overal bed mobility: Modified Independent     General bed mobility comments: Pt OOB upon  approach  Transfers Overall transfer level: Needs assistance Equipment used: Rolling walker (2 wheeled) Transfers: Sit to/from Stand Sit to Stand: Supervision Stand pivot transfers: Supervision       General transfer comment: Pt demonstrates much improved initial standing balance today with no unsteadiness with BUE supported evident  Ambulation/Gait Ambulation/Gait assistance: Supervision Gait Distance (Feet): 370 Feet Assistive device: Rolling walker (2 wheeled) Gait Pattern/deviations: Step-through pattern;Narrow base of support Gait velocity: decreased Gait velocity interpretation: <1.8 ft/sec, indicate of risk for recurrent falls General Gait Details: Pt demonstrates much improved gait speed 3x original speed 2 days ago. Able to ambulate around ends of halls and around loop       Balance Overall balance assessment: Needs assistance Sitting-balance support: No upper extremity supported;Feet supported Sitting balance-Leahy Scale: Good     Standing balance support: Bilateral upper extremity supported;During functional activity Standing balance-Leahy Scale: Fair Standing balance comment: reliant on RW for balance secondary to pain          Cognition Arousal/Alertness: Awake/alert Behavior During Therapy: WFL for tasks assessed/performed Overall Cognitive Status: Within Functional Limits for tasks assessed            Exercises Other Exercises Other Exercises: Neuro Education for PNS VS SNS Other Exercises: Relaxation vs worry.     General Comments General comments (skin integrity, edema, etc.): Pt demonstrates continued difficulty donning lumbar support due to pain, will need continued instruction.       Pertinent Vitals/Pain Pain Assessment: Faces Faces Pain Scale: Hurts little more Pain Location: back Pain Descriptors / Indicators: Aching;Discomfort;Guarding Pain Intervention(s): Monitored during session;Repositioned;Relaxation           PT Goals  (current goals can now  be found in the care plan section) Acute Rehab PT Goals Patient Stated Goal: reduce pain, improve mobility PT Goal Formulation: With patient Time For Goal Achievement: 02/14/20 Potential to Achieve Goals: Good Progress towards PT goals: Progressing toward goals    Frequency    Min 5X/week      PT Plan Equipment recommendations need to be updated       AM-PAC PT "6 Clicks" Mobility   Outcome Measure  Help needed turning from your back to your side while in a flat bed without using bedrails?: A Little Help needed moving from lying on your back to sitting on the side of a flat bed without using bedrails?: A Little Help needed moving to and from a bed to a chair (including a wheelchair)?: A Little Help needed standing up from a chair using your arms (e.g., wheelchair or bedside chair)?: None Help needed to walk in hospital room?: None Help needed climbing 3-5 steps with a railing? : A Lot 6 Click Score: 19    End of Session   Activity Tolerance: Patient tolerated treatment well Patient left: in chair;with chair alarm set;with call bell/phone within reach Nurse Communication: Mobility status PT Visit Diagnosis: Unsteadiness on feet (R26.81);Other abnormalities of gait and mobility (R26.89);Muscle weakness (generalized) (M62.81)     Time: 1601-0932 PT Time Calculation (min) (ACUTE ONLY): 35 min  Charges:  $Gait Training: 8-22 mins $Therapeutic Activity: 8-22 mins                     Ann Held PT, DPT Acute Rehab [place] P: [place] 02/03/2020, 10:01 AM

## 2020-02-04 MED ORDER — OXYCODONE HCL 10 MG PO TABS: 10.0000 mg | ORAL_TABLET | Freq: Four times a day (QID) | ORAL | 0 refills | Status: AC | PRN

## 2020-02-04 MED ORDER — WHITE PETROLATUM EX OINT
TOPICAL_OINTMENT | CUTANEOUS | Status: AC
  Filled 2020-02-04: qty 28.35

## 2020-02-04 MED FILL — oxyCODONE HCL 10 MG TABS: 10 | 8 days supply | Qty: 30 | Fill #0

## 2020-02-04 MED FILL — Heparin Sodium (Porcine) Inj 1000 Unit/ML: INTRAMUSCULAR | Qty: 30 | Status: AC

## 2020-02-04 MED FILL — Sodium Chloride IV Soln 0.9%: INTRAVENOUS | Qty: 1000 | Status: AC

## 2020-02-04 NOTE — Progress Notes (Signed)
Patient ID: Kelly Wall, female   DOB: 28-Jul-1961, 59 y.o.   MRN: [id number] BP 106/74 (BP Location: Left Arm)   Pulse 63   Temp 98.4 F (36.9 C) (Oral)   Resp 17   Ht 5\' 1"  (1.549 m)   Wt 50.7 kg   SpO2 100%   BMI 21.11 kg/m ' Alert and oriented x 4 Moving all extremities Wound is clean, dry, no signs of infection.  Will discharge tomorrow

## 2020-02-04 NOTE — Plan of Care (Signed)
  Problem: Health Behavior/Discharge Planning: Goal: Ability to manage health-related needs will improve Outcome: Progressing   Problem: Activity: Goal: Risk for activity intolerance will decrease Outcome: Progressing   Problem: Pain Managment: Goal: General experience of comfort will improve Outcome: Progressing   

## 2020-02-04 NOTE — TOC Progression Note (Signed)
Transition of Care [facility]) - Progression Note    Patient Details  Name: Kelly Wall MRN: [id number] Date of Birth: 1961-11-17  Transition of Care [facility]) CM/SW Contact  Epifanio Lesches, RN Phone Number: [phone] 02/04/2020, 4:57 PM  Clinical Narrative:    Referral made with [employer] for DME rolling walker and 3 in1/BSN. Equipment will be delivered to bedside in am prior to d/c .   Expected Discharge Plan: Home w Home Health Services Barriers to Discharge: Continued Medical Work up  Expected Discharge Plan and Services Expected Discharge Plan: Home w Home Health Services   Discharge Planning Services: CM Consult     Expected Discharge Date: 02/05/20               DME Arranged: 3-N-1, Walker rolling DME Agency: Other - Comment([employer]) Date DME Agency Contacted: 02/04/20 Time DME Agency Contacted: [phone] Representative spoke with at DME Agency: Vaughan Basta             Social Determinants of Health (SDOH) Interventions    Readmission Risk Interventions No flowsheet data found.

## 2020-02-04 NOTE — Progress Notes (Signed)
Physical Therapy Treatment Patient Details Name: Kelly Wall MRN: [id number] DOB: 02-17-1961 Today's Date: 02/04/2020    History of Present Illness Patient is a 59 y/o female s/p L4-5, L5-S1 PLIF on 01/30/20. PMH significant of ACDF C6-7, HTN, asthma, central cord syndrome C5.    PT Comments    Tx focused on stair performance (sideways), gait training, education (for redflag symptoms, MSK pain, ergonomics within restrictions for increased independence) and review of informational packet. Pt was agreeable to therapy, sitting EOB with RN upon arrival. Pt demonstrated improved independence with tolieting today performing modI safely. Pt reports high fatigue secondary to lack of sleep due to laxatives last night and bowel movements between midnight to 4 am. Pt demonstrated modI ability for stair negotiation while maintaining precautions. Pt verbalized understanding to practicing ergonomics with brace in place to ensure following lumbar restrictions until at least week 4 post op. Plan to discuss any questions from informational hand out next session prior to dc. PT will continue following acutely to address deficits listed below.  Follow Up Recommendations  Follow surgeon's recommendation for DC plan and follow-up therapies;Supervision - Intermittent;Home health PT     Equipment Recommendations  3in1 (PT);Rolling walker with 5" wheels    Recommendations for Other Services       Precautions / Restrictions Precautions Precautions: Back Precaution Booklet Issued: Yes (comment) Precaution Comments: No Bending, lifting, twisting Required Braces or Orthoses: Spinal Brace Spinal Brace: Lumbar corset;Applied in sitting position Restrictions Weight Bearing Restrictions: No    Mobility  Bed Mobility     General bed mobility comments: Pt sitting EOB upon arrival  Transfers Overall transfer level: Needs assistance Equipment used: Rolling walker (2 wheeled)   Sit to Stand:  Supervision Stand pivot transfers: Supervision       General transfer comment: modI for toileting today  Ambulation/Gait Ambulation/Gait assistance: Supervision Gait Distance (Feet): 275 Feet Assistive device: Rolling walker (2 wheeled) Gait Pattern/deviations: Step-through pattern;Narrow base of support;Decreased stride length     General Gait Details: Pt demonstrated improved gait speed today, demonstrating reduced hip extension throughout gait cycle bilaterallys   Stairs Stairs: Yes Stairs assistance: Min guard;Supervision Stair Management: One rail Right;Sideways(& backwards with walker)             Cognition Arousal/Alertness: Awake/alert Behavior During Therapy: WFL for tasks assessed/performed Overall Cognitive Status: Within Functional Limits for tasks assessed         General Comments: pt is highly motivated and demonstrates great carryover of strategies and education      Exercises General Exercises - Lower Extremity Ankle Circles/Pumps: AROM;Both;10 reps;Seated Quad Sets: Strengthening;Both;10 reps;Supine Gluteal Sets: Strengthening;Both;10 reps;Supine Heel Slides: AROM;Both;10 reps;Supine Hip ABduction/ADduction: AROM;10 reps;Supine Other Exercises Other Exercises: Education for red flag symptoms vs MSK (teach back)        Pertinent Vitals/Pain Pain Assessment: Faces Faces Pain Scale: Hurts little more Pain Location: back Pain Descriptors / Indicators: Aching;Grimacing Pain Intervention(s): Monitored during session;Repositioned;Relaxation           PT Goals (current goals can now be found in the care plan section) Acute Rehab PT Goals Patient Stated Goal: Get home safely PT Goal Formulation: With patient Time For Goal Achievement: 02/14/20 Potential to Achieve Goals: Good Progress towards PT goals: Progressing toward goals    Frequency    Min 5X/week      PT Plan Current plan remains appropriate       AM-PAC PT "6 Clicks"  Mobility   Outcome Measure  Help needed turning  from your back to your side while in a flat bed without using bedrails?: None Help needed moving from lying on your back to sitting on the side of a flat bed without using bedrails?: None Help needed moving to and from a bed to a chair (including a wheelchair)?: A Little Help needed standing up from a chair using your arms (e.g., wheelchair or bedside chair)?: A Little Help needed to walk in hospital room?: A Little Help needed climbing 3-5 steps with a railing? : A Little 6 Click Score: 20    End of Session Equipment Utilized During Treatment: Gait belt Activity Tolerance: Patient tolerated treatment well Patient left: in chair;with call bell/phone within reach Nurse Communication: Mobility status PT Visit Diagnosis: Unsteadiness on feet (R26.81);Other abnormalities of gait and mobility (R26.89);Muscle weakness (generalized) (M62.81)     Time: 8341-9622 PT Time Calculation (min) (ACUTE ONLY): 59 min  Charges:  $Gait Training: 8-22 mins $Therapeutic Exercise: 8-22 mins $Therapeutic Activity: 23-37 mins                     Ann Held PT, DPT Acute [place] P: [place] 02/04/2020, 10:16 AM

## 2020-02-04 NOTE — Discharge Summary (Signed)
Physician Discharge Summary  Patient ID: Kelly Wall MRN: [id number] DOB/AGE: Mar 04, 1961 59 y.o.  Admit date: 01/30/2020 Discharge date: 02/04/2020  Admission Diagnoses:Lumbar spondylolisthesis, lumbar stenosis, neurogenic claudication  Discharge Diagnoses: same Active Problems:   Spondylolisthesis of lumbar region   Discharged Condition: good  Hospital Course: Kelly Wall was admitted and taken to the operating room for an uncomplicated lumbar decompression and arthrodesis. Post op she has had expected pain but notes an improvement in her lower extremities. Her wound is clean, dry, and without signs of infection. She is voiding, and ambulating with ease. She is tolerating a regular diet at discharge.   Treatments: surgery: PROCEDURE:  Procedure(s): Posterior lumbar interbody arthrodesis L4/5, 5/1 [employer]ker Prolift cages filled with autograft morsels Lumbar laminectomy in excess of needed exposure for a PLIF L4, L5 Inferior facetectomies L4/5 Segmental posterior pedicle screw fixation Stryker Zia screws L4-S1     Discharge Exam: Blood pressure 106/74, pulse 63, temperature 98.4 F (36.9 C), temperature source Oral, resp. rate 17, height 5\' 1"  (1.549 m), weight 50.7 kg, SpO2 100 %. General appearance: alert, cooperative, appears stated age and no distress Neurologic: Grossly normal, history of cervical myelopathy due to spinal cord injury. Baseline myelopathy is present. Moving lower extremities well  Disposition: Discharge disposition: 01-Home or Self Care      Spondylolisthesis, Lumbar region  Allergies as of 02/04/2020      Reactions   Butorphanol Tartrate Shortness Of Breath   Tramadol Shortness Of Breath   Flexeril [cyclobenzaprine] Other (See Comments)   Knocks her out   Hydrochlorothiazide W-triamterene Other (See Comments)   REACTION: dizziness   Propranolol Hcl Other (See Comments)   Chest pain   Tetracycline Hcl Other (See Comments)   Bleeding in throat       Medication List    STOP taking these medications   diclofenac 75 MG EC tablet Commonly known as: VOLTAREN     TAKE these medications   acetaminophen 500 MG tablet Commonly known as: TYLENOL Take 500 mg by mouth at bedtime as needed for headache (pain).   baclofen 10 MG tablet Commonly known as: LIORESAL TAKE 1 TABLET BY MOUTH EVERY 8 HOURS AS NEEDED FOR MUSCLE SPASMS. What changed: See the new instructions.   Biotin 5000 MCG Caps Take 5,000 mcg by mouth at bedtime.   clindamycin 1 % lotion Commonly known as: CLEOCIN T Apply 1 application topically daily as needed (acne).   clindamycin-benzoyl peroxide gel Commonly known as: BENZACLIN Apply 1 application topically every morning.   cyclobenzaprine 5 MG tablet Commonly known as: FLEXERIL Take 1 tablet (5 mg total) by mouth 3 (three) times daily as needed for muscle spasms. What changed:   how much to take  when to take this   EQL Vitamin D3 25 MCG (1000 UT) tablet Generic drug: Cholecalciferol Take 1,000 Units by mouth at bedtime.   estrogen (conjugated)-medroxyprogesterone 0.625-2.5 MG tablet Commonly known as: PREMPRO Take 1 tablet by mouth at bedtime.   Fish Oil 1000 MG Caps Take 5,000 mg by mouth at bedtime.   Oxycodone HCl 10 MG Tabs Take 1 tablet (10 mg total) by mouth every 6 (six) hours as needed for up to 8 days for severe pain ((score 7 to 10)).   pregabalin 100 MG capsule Commonly known as: LYRICA TAKE 1 CAPSULE (100 MG TOTAL) BY MOUTH 2 TIMES DAILY. What changed: See the new instructions.   Retin-A 0.025 % cream Generic drug: tretinoin Apply 1 application topically at bedtime.  vitamin B-12 1000 MCG tablet Commonly known as: CYANOCOBALAMIN Take 1 tablet (1,000 mcg total) by mouth at bedtime.   vitamin C 500 MG tablet Commonly known as: ASCORBIC ACID Take 500 mg by mouth at bedtime.      Follow-up Information    Ashok Pall, MD Follow up in 3 week(s).   Specialty:  Neurosurgery Why: please call to make an appointment Contact information: [address] [place] [address] [phone]           Signed: Ashok Pall 02/04/2020, 2:51 PM

## 2020-02-04 NOTE — Consult Note (Signed)
   [facility] Inpatient Consult   02/04/2020  Kelly Wall 04-04-1961 [id number]     Patient is currently in a pending status with [facility] [[facility]] Care Management for post hospital follow up calls.  Patient will be outreached by a [facility] RN Care Coordinator for the [facility] plan. Patient will receive a post hospital call and will be evaluated for assessments and disease process education.    Call placed to patient via hospital operator to bedside phone, HIPAA verified, and explained to patient post hospital follow up and support.  Patient was very gracious and best number to call was her cell phone as she will be at her friends address for post hospital support:  Dr. Sherlynn Carbon address:  [phone], [place], [address] Best number patient states is [phone] [facility])   Plan: Patient will be followed by [facility] RN Care Coordinator. Will update [facility] RN CC of above information.   For additional questions or referrals please contact:   Charlesetta Shanks, RN BSN CCM [facility]  [phone] business mobile phone Toll free office [phone]  Fax number: [phone] [email or web address]

## 2020-02-04 NOTE — TOC Progression Note (Signed)
Transition of Care [facility]) - Progression Note    Patient Details  Name: Kelly Wall MRN: [id number] Date of Birth: [birth year]  Transition of Care [facility]) CM/SW Contact  Epifanio Lesches, RN Phone Number: 02/04/2020, 4:57 PM  Clinical Narrative:       Expected Discharge Plan: Home w Home Health Services Barriers to Discharge: Continued Medical Work up  Expected Discharge Plan and Services Expected Discharge Plan: Home w Home Health Services   Discharge Planning Services: CM Consult     Expected Discharge Date: 02/05/20               DME Arranged: 3-N-1, Walker rolling DME Agency: Other - Comment([employer]) Date DME Agency Contacted: 02/04/20 Time DME Agency Contacted: [phone] Representative spoke with at DME Agency: Vaughan Basta             Social Determinants of Health (SDOH) Interventions    Readmission Risk Interventions No flowsheet data found.

## 2020-02-04 NOTE — Discharge Instructions (Signed)

## 2020-02-05 ENCOUNTER — Other Ambulatory Visit: Payer: Self-pay | Admitting: Physical Medicine & Rehabilitation

## 2020-02-05 DIAGNOSIS — M79601 Pain in right arm: Secondary | ICD-10-CM

## 2020-02-05 DIAGNOSIS — R209 Unspecified disturbances of skin sensation: Secondary | ICD-10-CM

## 2020-02-05 DIAGNOSIS — S14125S 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 of cervical spinal cord, sequela: Secondary | ICD-10-CM

## 2020-02-05 NOTE — Progress Notes (Signed)
Physical Therapy Treatment Patient Details Name: Kelly Wall MRN: [id number] DOB: 11-27-1961 Today's Date: 02/05/2020    History of Present Illness Patient is a 59 y/o female s/p L4-5, L5-S1 PLIF on 01/30/20. PMH significant of ACDF C6-7, HTN, asthma, central cord syndrome C5.    PT Comments    Tx focused on addressing concerns/questions about transition to home environment. Attempted ergonomics for light items off floor without bending/twisting/lifting, unsuccessfully due to prior LE weakness and instability with RW. Pt was able to lower hips to therapist knee but unable to reach low enough due to hip/core/knee weakness for proper form, and abandon attempt (2x). Attempted with R knee forward and L knee forward position. Pt verbalized understanding for additional need for reliance on others over the next 6-8 weeks. Educated patient on healing process and need to protect surgery for optimal recovery, walking for primary exercise until able to attend therapy. Pt verbalized understanding somewhat reluctantly. Pt demonstrated increased endurance today ambulating 543ft with no near LOB with RW with intermittent VCs for gait sequencing & posture. Pt discharging at end of session. Can continue to benefit from HHPT to progress functional mobility and assess home environment for optimal independence.     Follow Up Recommendations  Follow surgeon's recommendation for DC plan and follow-up therapies;Supervision - Intermittent;Home health PT     Equipment Recommendations  3in1 (PT);Rolling walker with 5" wheels    Recommendations for Other Services Rehab consult     Precautions / Restrictions Precautions Precautions: Back Precaution Booklet Issued: Yes (comment) Precaution Comments: reviewed all back precuations with pt; pt able to verbalize. Educated on no arching in back Required Braces or Orthoses: Spinal Brace Spinal Brace: Lumbar corset;Applied in sitting position Restrictions Weight  Bearing Restrictions: No Other Position/Activity Restrictions: no bending, no lifting, no twisting, no arching    Mobility  Bed Mobility Overal bed mobility: Modified Independent      General bed mobility comments: Pt OOB upon arrival  Transfers Overall transfer level: Modified independent Equipment used: Rolling walker (2 wheeled) Transfers: Sit to/from Stand Sit to Stand: Modified independent (Device/Increase time) Stand pivot transfers: Supervision       General transfer comment: ModI for ambulation and transfers today in regular clothes  Ambulation/Gait Ambulation/Gait assistance: Modified independent (Device/Increase time) Gait Distance (Feet): 550 Feet Assistive device: Rolling walker (2 wheeled) Gait Pattern/deviations: Step-through pattern;Narrow base of support;Decreased stride length     General Gait Details: Pt demonstrates safe ambulation with head turns and distraction with no near LOB demonstrated    Balance Overall balance assessment: Needs assistance Sitting-balance support: No upper extremity supported;Feet supported Sitting balance-Leahy Scale: Good     Standing balance support: Single extremity supported;During functional activity Standing balance-Leahy Scale: Fair Standing balance comment: single UE for balance secondary to pain during ADL completion      Cognition Arousal/Alertness: Awake/alert Behavior During Therapy: WFL for tasks assessed/performed Overall Cognitive Status: Within Functional Limits for tasks assessed           General Comments: pt is highly motivated and demonstrates great carryover of strategies and education      Exercises Other Exercises Other Exercises: Discuss healing process and protection during first 6-8 weeks Other Exercises: Discussed RW usage and fall safety    General Comments General comments (skin integrity, edema, etc.): Pt in normal clothes with lumbar brace donned prior to arrival.      Pertinent  Vitals/Pain Pain Assessment: No/denies pain Pain Score: 0-No pain Faces Pain Scale: Hurts even more Pain  Location: back Pain Descriptors / Indicators: Aching;Grimacing;Guarding Pain Intervention(s): Monitored during session;Repositioned;Relaxation           PT Goals (current goals can now be found in the care plan section) Acute Rehab PT Goals Patient Stated Goal: Get home safely PT Goal Formulation: With patient Time For Goal Achievement: 02/14/20 Potential to Achieve Goals: Good Progress towards PT goals: Progressing toward goals    Frequency    Min 5X/week      PT Plan Current plan remains appropriate       AM-PAC PT "6 Clicks" Mobility   Outcome Measure  Help needed turning from your back to your side while in a flat bed without using bedrails?: None Help needed moving from lying on your back to sitting on the side of a flat bed without using bedrails?: None Help needed moving to and from a bed to a chair (including a wheelchair)?: A Little Help needed standing up from a chair using your arms (e.g., wheelchair or bedside chair)?: A Little Help needed to walk in hospital room?: A Little Help needed climbing 3-5 steps with a railing? : A Little 6 Click Score: 20    End of Session Equipment Utilized During Treatment: Gait belt Activity Tolerance: Patient tolerated treatment well Patient left: in chair;with call bell/phone within reach;with family/visitor present Nurse Communication: Mobility status PT Visit Diagnosis: Unsteadiness on feet (R26.81);Other abnormalities of gait and mobility (R26.89);Muscle weakness (generalized) (M62.81)     Time: 1610-9604 PT Time Calculation (min) (ACUTE ONLY): 42 min  Charges:  $Gait Training: 8-22 mins $Therapeutic Exercise: 8-22 mins $Therapeutic Activity: 8-22 mins                     Ann Held PT, DPT Acute Rehab [place] P: [place] 02/05/2020, 1:06 PM

## 2020-02-05 NOTE — TOC Transition Note (Addendum)
Transition of Care [facility]) - CM/SW Discharge Note   Patient Details  Name: Kelly Wall MRN: [id number] Date of Birth: 07-01-1961  [facility] CM/SW Contact:  Epifanio Lesches, RN Phone Number: 02/05/2020, 9:42 AM   Clinical Narrative:    Admitted with Lumbar spondylolisthesis, lumbar stenosis, neurogenic claudication, s/p lumbar fusion 01/30/2020. Pt will transition to home  with friend today. DME has been delivered to bedside. Pt states friend to provide transportation to home. Pt without problems affording Rx meds. [facility] will provide pt with home health services, SOC within 24-48 hrs. Pt made aware.   Final next level of care: Home w Home Health Services Barriers to Discharge: No Barriers Identified   Patient Goals and CMS Choice     Choice offered to / list presented to : Patient  Discharge Placement   Discharge Plan and Services   Discharge Planning Services: CM Consult            DME Arranged: 3-N-1, Walker rolling DME Agency: Other - Comment([employer]) Date DME Agency Contacted: 02/04/20 Time DME Agency Contacted: [phone] Representative spoke with at DME Agency: Vaughan Basta HH Arranged: PT, OT   Date HH Agency Contacted: 02/04/20 Time HH Agency Contacted: 1630 Representative spoke with at [facility] Agency: Kandee Keen  Social Determinants of Health (SDOH) Interventions     Readmission Risk Interventions No flowsheet data found.

## 2020-02-05 NOTE — Progress Notes (Signed)
Occupational Therapy Treatment Patient Details Name: Kelly Wall MRN: [id number] DOB: 1961/03/17 Today's Date: 02/05/2020    History of present illness Patient is a 59 y/o female s/p L4-5, L5-S1 PLIF on 01/30/20. PMH significant of ACDF C6-7, HTN, asthma, central cord syndrome C5.   OT comments  Pt making good progress with functional goals. Session focuses on LB ADLs, shower safety, A/E use and DME use for ADL transfer safety. Pt able to recall all back precautions, can don back brace independently and is able to maintain back precautions during ADLs and ADL mobility. Pt to d/c home today  Follow Up Recommendations  Follow surgeon's recommendation for DC plan and follow-up therapies;Supervision - Intermittent;Home health OT    Equipment Recommendations  3 in 1 bedside commode;Other (comment)(LH shoe horn, reacher)    Recommendations for Other Services      Precautions / Restrictions Precautions Precautions: Back Precaution Comments: reviewed all back precuations with pt; pt able to verbalize. Educated on no arching in back Required Braces or Orthoses: Spinal Brace Spinal Brace: Lumbar corset;Applied in sitting position Restrictions Weight Bearing Restrictions: No Other Position/Activity Restrictions: no bending, no lifting, no twisting, no arching       Mobility Bed Mobility               General bed mobility comments: pt in bathroom in shower upon arrival  Transfers Overall transfer level: Needs assistance Equipment used: Rolling walker (2 wheeled) Transfers: Sit to/from Stand Sit to Stand: Supervision Stand pivot transfers: Supervision            Balance Overall balance assessment: Needs assistance Sitting-balance support: No upper extremity supported;Feet supported Sitting balance-Leahy Scale: Good     Standing balance support: Single extremity supported;During functional activity Standing balance-Leahy Scale: Fair                             ADL either performed or assessed with clinical judgement   ADL Overall ADL's : Needs assistance/impaired     Grooming: Wash/dry hands;Wash/dry face;Oral care;Modified independent;Standing   Upper Body Bathing: Set up;Supervision/ safety;Sitting   Lower Body Bathing: Supervison/ safety;Set up Lower Body Bathing Details (indicate cue type and reason): pt able to cross legs sitting on shower seta to reach feet Upper Body Dressing : Supervision/safety;Set up;Sitting   Lower Body Dressing: Set up;Supervision/safety Lower Body Dressing Details (indicate cue type and reason): pt able to don underwear, pants, socks and shoes while seated in recliner by crossing legs. Toilet Transfer: Supervision/safety;Ambulation;RW;BSC   Toileting- Clothing Manipulation and Hygiene: Supervision/safety   Tub/ Engineer, structural: Supervision/safety;Ambulation;Rolling walker   Functional mobility during ADLs: Supervision/safety;Rolling walker General ADL Comments: reviewed A/E use with pt, educated pt on LH show horn to decrease difficulty with donning shoes     Vision Patient Visual Report: No change from baseline     Perception     Praxis      Cognition Arousal/Alertness: Awake/alert Behavior During Therapy: WFL for tasks assessed/performed Overall Cognitive Status: Within Functional Limits for tasks assessed                                          Exercises     Shoulder Instructions       General Comments      Pertinent Vitals/ Pain       Pain Assessment: No/denies pain Faces  Pain Scale: Hurts even more Pain Location: back Pain Descriptors / Indicators: Aching;Grimacing;Guarding Pain Intervention(s): Monitored during session;Repositioned;Relaxation  Home Living                                          Prior Functioning/Environment              Frequency  Min 2X/week        Progress Toward Goals  OT Goals(current goals can now  be found in the care plan section)  Progress towards OT goals: Progressing toward goals  Acute Rehab OT Goals Patient Stated Goal: Get home safely  Plan Discharge plan remains appropriate;Frequency remains appropriate    Co-evaluation                 AM-PAC OT "6 Clicks" Daily Activity     Outcome Measure   Help from another person eating meals?: None Help from another person taking care of personal grooming?: A Little Help from another person toileting, which includes using toliet, bedpan, or urinal?: A Little Help from another person bathing (including washing, rinsing, drying)?: A Little Help from another person to put on and taking off regular upper body clothing?: A Little Help from another person to put on and taking off regular lower body clothing?: A Little 6 Click Score: 19    End of Session Equipment Utilized During Treatment: Rolling walker;Back brace;Gait belt;Other (comment)(built in shower seat)  OT Visit Diagnosis: Unsteadiness on feet (R26.81);Muscle weakness (generalized) (M62.81);Pain Pain - part of body: (back)   Activity Tolerance Patient tolerated treatment well   Patient Left in chair;with call bell/phone within reach   Nurse Communication          Time: 8921-1941 OT Time Calculation (min): 31 min  Charges: OT General Charges $OT Visit: 1 Visit OT Treatments $Self Care/Home Management : 8-22 mins $Therapeutic Activity: 8-22 mins     Britt Bottom 02/05/2020, 12:38 PM

## 2020-02-05 NOTE — Progress Notes (Signed)
Discharge summary given to pt with explaination. Pt verbalized understanding. Alert/oriented in no acute distress.No complaints voiced. Pt in wheelchair accompanied by volunteer and her friend to transport her back home.

## 2020-02-05 NOTE — Plan of Care (Signed)
  Problem: Clinical Measurements: Goal: Ability to maintain clinical measurements within normal limits will improve Outcome: Progressing Goal: Will remain free from infection Outcome: Progressing Goal: Diagnostic test results will improve Outcome: Progressing Goal: Respiratory complications will improve Outcome: Completed/Met Goal: Cardiovascular complication will be avoided Outcome: Progressing   Problem: Activity: Goal: Risk for activity intolerance will decrease Outcome: Progressing   Problem: Coping: Goal: Level of anxiety will decrease Outcome: Progressing   Problem: Pain Managment: Goal: General experience of comfort will improve Outcome: Progressing   Problem: Safety: Goal: Ability to remain free from injury will improve Outcome: Progressing   Problem: Skin Integrity: Goal: Risk for impaired skin integrity will decrease Outcome: Progressing

## 2020-02-06 ENCOUNTER — Other Ambulatory Visit: Payer: Self-pay | Admitting: *Deleted

## 2020-02-06 MED FILL — PREGABALIN 100 MG CAPS: 100 | 30 days supply | Qty: 60 | Fill #0

## 2020-02-06 NOTE — Patient Outreach (Signed)
[facility]) Care Management  02/06/2020  Kelly Wall 04/09/1961 [id number]   Transition of care telephone call  Referral received:01/29/20 Initial outreach: 02/06/20 Insurance: [facility]   Initial unsuccessful telephone call to patient's preferred number in order to complete transition of care assessment; no answer, left HIPAA compliant voicemail message requesting return call.   Objective: Per electronic record   was hospitalized at [facility] from 3/5-3/11/21 for Lumbar 4-5, Sacral one posterior lumbar interbody fusion.  Comorbidities include: spondylolisthesis, of lumbar region, hypertension, migraine headache,cervical myelopathy,  She was discharged to home on 3/12/21with  home health services for PT/OT by [facility] home health, along with DME equipment at discharge Rolling walker and 3 in 1.   Plan: This RNCM will route unsuccessful outreach letter with [facility] Care Management pamphlet and 24 hour Nurse Advice Line Magnet to [employer] Care Management clinical pool to be mailed to patient's home address. This RNCM will attempt another outreach within 4 business days.  Egbert Garibaldi, RN, BSN  [facility] Care Management,Care Management Coordinator  [phone]- Mobile [phone]- Toll Free Main Office

## 2020-02-07 DIAGNOSIS — S14125D 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 of cervical spinal cord, subsequent encounter: Secondary | ICD-10-CM | POA: Diagnosis not present

## 2020-02-07 DIAGNOSIS — M48062 Spinal stenosis, lumbar region with neurogenic claudication: Secondary | ICD-10-CM | POA: Diagnosis not present

## 2020-02-07 DIAGNOSIS — M4316 Spondylolisthesis, lumbar region: Secondary | ICD-10-CM | POA: Diagnosis not present

## 2020-02-07 DIAGNOSIS — Z4789 Encounter for other orthopedic aftercare: Secondary | ICD-10-CM | POA: Diagnosis not present

## 2020-02-07 DIAGNOSIS — M47816 Spondylosis without myelopathy or radiculopathy, lumbar region: Secondary | ICD-10-CM | POA: Diagnosis not present

## 2020-02-09 DIAGNOSIS — M47816 Spondylosis without myelopathy or radiculopathy, lumbar region: Secondary | ICD-10-CM | POA: Diagnosis not present

## 2020-02-09 DIAGNOSIS — M48062 Spinal stenosis, lumbar region with neurogenic claudication: Secondary | ICD-10-CM | POA: Diagnosis not present

## 2020-02-09 DIAGNOSIS — S14125D 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 of cervical spinal cord, subsequent encounter: Secondary | ICD-10-CM | POA: Diagnosis not present

## 2020-02-09 DIAGNOSIS — Z4789 Encounter for other orthopedic aftercare: Secondary | ICD-10-CM | POA: Diagnosis not present

## 2020-02-09 DIAGNOSIS — M4316 Spondylolisthesis, lumbar region: Secondary | ICD-10-CM | POA: Diagnosis not present

## 2020-02-10 MED FILL — DICLOFENAC SOD EC 75 MG TAB: 75 | 30 days supply | Qty: 60 | Fill #2

## 2020-02-11 ENCOUNTER — Encounter: Payer: Self-pay | Admitting: *Deleted

## 2020-02-11 ENCOUNTER — Ambulatory Visit: Payer: 59 | Admitting: Physical Medicine & Rehabilitation

## 2020-02-11 ENCOUNTER — Other Ambulatory Visit: Payer: Self-pay | Admitting: *Deleted

## 2020-02-11 DIAGNOSIS — Z4789 Encounter for other orthopedic aftercare: Secondary | ICD-10-CM | POA: Diagnosis not present

## 2020-02-11 DIAGNOSIS — M47816 Spondylosis without myelopathy or radiculopathy, lumbar region: Secondary | ICD-10-CM | POA: Diagnosis not present

## 2020-02-11 DIAGNOSIS — M48062 Spinal stenosis, lumbar region with neurogenic claudication: Secondary | ICD-10-CM | POA: Diagnosis not present

## 2020-02-11 DIAGNOSIS — M4316 Spondylolisthesis, lumbar region: Secondary | ICD-10-CM | POA: Diagnosis not present

## 2020-02-11 DIAGNOSIS — S14125D 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 of cervical spinal cord, subsequent encounter: Secondary | ICD-10-CM | POA: Diagnosis not present

## 2020-02-11 NOTE — Patient Outreach (Signed)
[facility]) Care Management  02/11/2020  Kelly Wall 05-Nov-1961 [id number]   Transition of care call/case closure   Referral received:01/29/20 Initial outreach:02/06/20 Insurance: [employer]    Subjective: Successful 2nd telephone call to patient's preferred number in order to complete transition of care assessment; 2 HIPAA identifiers verified. Explained purpose of call and completed transition of care assessment.  Kelly Wall reports doing better, she discussed some difficulty with sleeping at night due to positioning, she  denies post-operative problems, says surgical area with dressing in place  states surgical pain management is getting better  with prescribed medications, tolerating diet, denies bowel or bladder problems.  Patient friend is  assisting with her  recovery. She continues to wear back brace with her friend assistance with applying and getting up from bed at times. Patient has had initial home PT evaluation and first session on today. She has been working on exercises provided by therapist at hospital discharge.  She denies any ongoing health issues and says she does not need a referral to one of the [place] chronic disease management programs.  She does not have the hospital indemnity, she has made contact with benefits office regarding questions on using health spending account and regarding pharmacy benefits.  She uses a [employer] outpatient pharmacy, [facility] outpatient [facility].      Objective:  Kelly Wall   was hospitalized [facility] from 3/5-3/11/21 for Lumbar 4-5, Sacral one posterior lumbar interbody fusion.  Comorbidities include: spondylolisthesis, of lumbar region, hypertension, migraine headache,cervical myelopathy,  She was discharged to home on 3/12/21with  home health services for PT/OT by [facility] home health, along with DME equipment at discharge Rolling walker and 3 in 1. Assessment:  Patient voices good understanding of all  discharge instructions.  See transition of care flowsheet for assessment details.   Plan:  Reviewed hospital discharge diagnosis of Lumbar 4-5, Sacral 1,  and discharge treatment plan using hospital discharge instructions, assessing medication adherence, reviewing problems requiring provider notification, and discussing the importance of follow up with surgeon, primary care provider and/or specialists as directed. Reviewed [place] healthy lifestyle program information to receive discounted premium for  2022  Step 1: Get annual physical between November 27, 2018 and May 27, 2020; Step 2: Complete your health assessment between November 28, 2019 and July 28, 2020 at [email or web address] Step 3:Identify your current health status and complete the corresponding action step between January 1, and July 28, 2020.    No ongoing care management needs identified so will close case to [facility] Care Management services and route successful outreach letter with [facility] Care Management pamphlet and 24 Hour Nurse Line Magnet to [employer] Care Management clinical pool to be mailed to patient's home address.  Thanked patient for their services to [facility].  Egbert Garibaldi, RN, BSN  [facility] Care Management,Care Management Coordinator  [phone]- Mobile [phone]- Toll Free Main Office

## 2020-02-13 DIAGNOSIS — Z4789 Encounter for other orthopedic aftercare: Secondary | ICD-10-CM | POA: Diagnosis not present

## 2020-02-13 DIAGNOSIS — M47816 Spondylosis without myelopathy or radiculopathy, lumbar region: Secondary | ICD-10-CM | POA: Diagnosis not present

## 2020-02-13 DIAGNOSIS — S14125D 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 of cervical spinal cord, subsequent encounter: Secondary | ICD-10-CM | POA: Diagnosis not present

## 2020-02-13 DIAGNOSIS — M4316 Spondylolisthesis, lumbar region: Secondary | ICD-10-CM | POA: Diagnosis not present

## 2020-02-13 DIAGNOSIS — M48062 Spinal stenosis, lumbar region with neurogenic claudication: Secondary | ICD-10-CM | POA: Diagnosis not present

## 2020-02-17 DIAGNOSIS — M48062 Spinal stenosis, lumbar region with neurogenic claudication: Secondary | ICD-10-CM | POA: Diagnosis not present

## 2020-02-19 DIAGNOSIS — M4316 Spondylolisthesis, lumbar region: Secondary | ICD-10-CM | POA: Diagnosis not present

## 2020-02-19 DIAGNOSIS — S14125D 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 of cervical spinal cord, subsequent encounter: Secondary | ICD-10-CM | POA: Diagnosis not present

## 2020-02-19 DIAGNOSIS — Z4789 Encounter for other orthopedic aftercare: Secondary | ICD-10-CM | POA: Diagnosis not present

## 2020-02-19 DIAGNOSIS — M48062 Spinal stenosis, lumbar region with neurogenic claudication: Secondary | ICD-10-CM | POA: Diagnosis not present

## 2020-02-19 DIAGNOSIS — M47816 Spondylosis without myelopathy or radiculopathy, lumbar region: Secondary | ICD-10-CM | POA: Diagnosis not present

## 2020-02-20 DIAGNOSIS — M4316 Spondylolisthesis, lumbar region: Secondary | ICD-10-CM | POA: Diagnosis not present

## 2020-02-20 DIAGNOSIS — M47816 Spondylosis without myelopathy or radiculopathy, lumbar region: Secondary | ICD-10-CM | POA: Diagnosis not present

## 2020-02-20 DIAGNOSIS — S14125D 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 of cervical spinal cord, subsequent encounter: Secondary | ICD-10-CM | POA: Diagnosis not present

## 2020-02-20 DIAGNOSIS — M48062 Spinal stenosis, lumbar region with neurogenic claudication: Secondary | ICD-10-CM | POA: Diagnosis not present

## 2020-02-20 DIAGNOSIS — Z4789 Encounter for other orthopedic aftercare: Secondary | ICD-10-CM | POA: Diagnosis not present

## 2020-02-26 ENCOUNTER — Encounter: Payer: Self-pay | Admitting: Physical Therapy

## 2020-02-26 ENCOUNTER — Other Ambulatory Visit: Payer: Self-pay

## 2020-02-26 ENCOUNTER — Ambulatory Visit: Payer: 59 | Attending: Neurosurgery | Admitting: Physical Therapy

## 2020-02-26 DIAGNOSIS — R278 Other lack of coordination: Secondary | ICD-10-CM | POA: Insufficient documentation

## 2020-02-26 DIAGNOSIS — M6281 Muscle weakness (generalized): Secondary | ICD-10-CM | POA: Diagnosis present

## 2020-02-26 DIAGNOSIS — M5412 Radiculopathy, cervical region: Secondary | ICD-10-CM | POA: Diagnosis not present

## 2020-02-26 DIAGNOSIS — R2681 Unsteadiness on feet: Secondary | ICD-10-CM | POA: Insufficient documentation

## 2020-02-26 DIAGNOSIS — R29898 Other symptoms and signs involving the musculoskeletal system: Secondary | ICD-10-CM | POA: Diagnosis not present

## 2020-02-26 DIAGNOSIS — M545 Low back pain: Secondary | ICD-10-CM | POA: Insufficient documentation

## 2020-02-26 DIAGNOSIS — R2689 Other abnormalities of gait and mobility: Secondary | ICD-10-CM | POA: Diagnosis not present

## 2020-02-26 DIAGNOSIS — M62838 Other muscle spasm: Secondary | ICD-10-CM | POA: Insufficient documentation

## 2020-02-26 MED FILL — TRETINOIN 0.025% CREAM: 0.025 | 90 days supply | Qty: 45 | Fill #2

## 2020-02-26 NOTE — Therapy (Addendum)
[facility] [address] [place], [place], [address] Phone: [phone]   Fax:  [phone]  Physical Therapy Evaluation  Patient Details  Name: Kelly Wall MRN: [id number] Date of Birth: 03-Oct-1961 Referring Provider (PT): Coletta Memos, MD   Encounter Date: 02/26/2020  PT End of Session - 02/26/20 1014    Visit Number  1    Number of Visits  21    Date for PT Re-Evaluation  04/01/20    PT Start Time  1015    PT Stop Time  1059    PT Time Calculation (min)  44 min    Equipment Utilized During Treatment  Gait belt    Activity Tolerance  Patient tolerated treatment well    Behavior During Therapy  [facility] for tasks assessed/performed       Past Medical History:  Diagnosis Date  . Asthma    child- occ exersie induced now  . Family history of adverse reaction to anesthesia    dad hard to awaken  . Hypertension    no rx  in 5 yrs fish oil helps now  . Paralysis [facility])     Past Surgical History:  Procedure Laterality Date  . ANTERIOR CERVICAL DECOMP/DISCECTOMY FUSION N/A 03/30/2018   Procedure: ANTERIOR CERVICAL DECOMPRESSION/DISCECTOMY FUSION Cervical four-five and Cervical five-six;  Surgeon: Coletta Memos, MD;  Location: [facility] OR;  Service: Neurosurgery;  Laterality: N/A;  . BACK SURGERY    . CERVICAL DISC ARTHROPLASTY N/A 04/20/2016   Procedure: Cervical Six-Seven Artificial Disc Replacement-Cervical;  Surgeon: Tia Alert, MD;  Location: [facility] OR[facility];  Service: Neurosurgery;  Laterality: N/A;    There were no vitals filed for this visit.   Subjective Assessment - 02/26/20 1019    Subjective  Patient reports constant pain in both arms. She broke her neck 2 years ago and she had a lumbar fusion 3 weeks ago. She also reports weakness in her legs. She said pain has been going on for 2 years and is constant. Reports N/T due to neuropathy. She sometimes drops objects due to weakness in the hands. Patient reports that she  would like to return to cycling and plans on returning to work by May 3rd.    Pertinent History  Patient broke her neck 2 years ago, lumbar fusion    Limitations  Lifting    Patient Stated Goals  Get stronger, and be safer with twisting    Currently in Pain?  Yes    Pain Score  5     Pain Location  Neck   bilateral UE   Pain Orientation  Medial    Pain Descriptors / Indicators  Tingling;Numbness;Sharp;Constant;Aching    Pain Type  Chronic pain    Pain Radiating Towards  Shoulder, arms, and hand    Pain Onset  More than a month ago    Pain Frequency  Constant    Aggravating Factors   Reaching, lifting    Pain Relieving Factors  medicine    Effect of Pain on Daily Activities  Driving, lifting    Multiple Pain Sites  No         [facility] PT Assessment - 02/26/20 0001      Assessment   Medical Diagnosis  Radiculopathy, cervical region     Referring Provider (PT)  Coletta Memos, MD    Onset Date/Surgical Date  03/29/18    Hand Dominance  Right    Next MD Visit  03/03/2020    Prior Therapy  Yes      Precautions   Precautions  Back    Precaution Booklet Issued  Yes (comment)    Precaution Comments  15 lbs lifting limit      Restrictions   Weight Bearing Restrictions  No      Balance Screen   Has the patient fallen in the past 6 months  Yes    How many times?  5+    Has the patient had a decrease in activity level because of a fear of falling?   Yes    Is the patient reluctant to leave their home because of a fear of falling?   Yes      Home Environment   Living Environment  Other (Comment)    Additional Comments  Apartment      Prior Function   Level of Independence  Independent    Vocation  Full time employment    Vocation Requirements  Typing     Leisure  Cycling, running, gym      Cognition   Overall Cognitive Status  Within Functional Limits for tasks assessed    Attention  Focused    Focused Attention  Appears intact    Memory  Appears intact    Awareness  Appears  intact    Problem Solving  Appears intact    Executive Function  Reasoning    Reasoning  Appears intact      Observation/Other Assessments   Focus on Therapeutic Outcomes (FOTO)   55% limit      Posture/Postural Control   Posture/Postural Control  Postural limitations    Postural Limitations  Decreased lumbar lordosis;Decreased thoracic kyphosis;Forward head      ROM / Strength   AROM / PROM / Strength  Strength;AROM      AROM   AROM Assessment Site  Cervical    Cervical Flexion  27    Cervical Extension  25    Cervical - Right Rotation  60    Cervical - Left Rotation  56      Strength   Strength Assessment Site  Cervical;Shoulder    Right/Left Shoulder  Right;Left    Left Shoulder Flexion  4/5    Left Shoulder Extension  4/5    Left Shoulder Internal Rotation  4/5    Left Shoulder External Rotation  5/5    Cervical Extension  5/5      Special Tests    Special Tests  Cervical    Cervical Tests  Spurling's;Dictraction;other      Spurling's   Findings  Negative    Side  Right      Distraction Test   Findngs  Negative      other    Findings  Negative    Comment  Alar Lig                Objective measurements completed on examination: See above findings.      [facility] Adult PT Treatment/Exercise - 02/26/20 0001      Exercises   Exercises  Neck;Shoulder      Neck Exercises: Supine   Cervical Isometrics  Flexion;3 secs      Shoulder Exercises: Seated   Retraction  AROM;Both;10 reps      Neck Exercises: Stretches   Upper Trapezius Stretch  Right;Left;10 seconds    Upper Trapezius Stretch Limitations  Supine    Levator Stretch  Right;Left;10 seconds    Levator Stretch Limitations  Supine  PT Education - 02/26/20 1200    Education Details  Patient educated on exercises, current level of functioning, and expected level of functioning after recovery    Person(s) Educated  Patient    Methods  Explanation;Demonstration     Comprehension  Verbalized understanding;Returned demonstration       PT Short Term Goals - 02/26/20 1254      PT SHORT TERM GOAL #1   Title  Patient will be able to demonstrate proper posture w/o being cued.    Baseline  Forward head, rounded shoulders    Time  5    Period  Weeks    Status  New    Target Date  04/01/20        PT Long Term Goals - 02/26/20 1255      PT LONG TERM GOAL #1   Title  Patient will report that she rarely drops objects due to muscle weakness in order to decrease the amount of bending and lifting that she has to perform    Baseline  Pt. reports that muscle weakness causes her to drop objects    Time  10    Period  Weeks    Status  New    Target Date  05/06/20      PT LONG TERM GOAL #2   Title  Patient will comfortably achieve 60 degrees of cervical rotation in order to decrease the amount of upper body twisting when driving.    Baseline  Rt. 60/Lt. 56    Time  10    Period  Weeks    Status  New             Plan - 02/26/20 1204    Clinical Impression Statement  Today the patient has N/T in her shoulders, arms, and hands. She has decreased ROM in her neck, but demonstrated an adequate amount of strength during her cervical and shoulder MMT. She reports that her greatest concern is her pain in the lumbar region. Her spurling's and distraction tests were negative, but the damage from breaking her neck 2 years ago continues to cause radicular pain. She would benefit from PT in order to address pain and ROM deficits in the upper quadrant.    Personal Factors and Comorbidities  Comorbidity 3+    Comorbidities  HTN, lumbar fusion, paralysis    Examination-Activity Limitations  Carry;Lift;Reach Overhead    Examination-Participation Restrictions  Other;Driving    Stability/Clinical Decision Making  Unstable/Unpredictable    Clinical Decision Making  High    Rehab Potential  Good    PT Frequency  2x / week    PT Duration  Other (comment)    PT  Treatment/Interventions  ADLs/Self Care Home Management;Cryotherapy;Electrical Stimulation;Iontophoresis 4mg /ml Dexamethasone;Moist Heat;Traction;Ultrasound;Stair training;Gait training;Functional mobility training;Therapeutic exercise;Therapeutic activities;Balance training;Neuromuscular re-education;Patient/family education;Manual techniques;Scar mobilization;Passive range of motion;Dry needling;Energy conservation;Taping;Visual/perceptual remediation/compensation;Joint Manipulations    PT Next Visit Plan  Revisit HEP, and back referral, dynamometer    PT Home Exercise Plan  Cervical Isometric holds, Upper trap and levator stretches, Scapular retraction    Consulted and Agree with Plan of Care  Patient       Patient will benefit from skilled therapeutic intervention in order to improve the following deficits and impairments:  Decreased activity tolerance, Decreased coordination, Decreased endurance, Decreased range of motion, Impaired flexibility, Improper body mechanics, Postural dysfunction, Pain  Visit Diagnosis: Radiculopathy, cervical region     Problem List Patient Active Problem List   Diagnosis Date Noted  . Spondylolisthesis of  lumbar region 01/30/2020  . Lumbar radiculopathy 10/15/2019  . Spondylosis of lumbar spine 08/06/2019  . Lumbar facet arthropathy 02/05/2019  . Cervical myelopathy (HCC) 04/02/2018  . Bilateral arm weakness   . Numbness and tingling in both hands   . Paresthesia and pain of both upper extremities   . Trauma   . Post-operative pain   . Uncomplicated asthma   . Benign essential HTN   . Bradycardia   . Steroid-induced hyperglycemia   . 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 of cervical spinal cord (HCC) 03/30/2018  . S/P cervical spinal fusion 04/20/2016  . Cervical radicular pain 03/22/2016  . Hamstring tightness 12/11/2014  . 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 Pain in joint, upper arm 11/17/2014  . Labyrinthitis 05/15/2012  . Sinusitis acute 02/12/2012  .  Well adult exam 06/27/2011  . Rash, skin 06/27/2011  . ADHESIVE CAPSULITIS, LEFT 11/09/2009  . SHOULDER PAIN 09/09/2009  . HAIR LOSS 07/24/2008  . ELEVATED BP 07/24/2008  . CARPAL TUNNEL SYNDROME 03/23/2008  . PARESTHESIA 03/23/2008  . MIGRAINE HEADACHE 08/02/2007    Cato Mulligan, SPT 02/26/2020, 3:18 PM  [facility] [address] [place], [place], [address] Phone: [phone]   Fax:  [phone]  Name: Kelly Wall MRN: [id number] Date of Birth: 20-Mar-1961

## 2020-03-03 ENCOUNTER — Encounter: Payer: 59 | Attending: Physical Medicine & Rehabilitation | Admitting: Physical Medicine & Rehabilitation

## 2020-03-03 ENCOUNTER — Encounter: Payer: Self-pay | Admitting: Physical Medicine & Rehabilitation

## 2020-03-03 ENCOUNTER — Other Ambulatory Visit: Payer: Self-pay

## 2020-03-03 VITALS — BP 110/70 | HR 70 | Temp 97.7°F | Ht 61.0 in | Wt 113.0 lb

## 2020-03-03 DIAGNOSIS — M47816 Spondylosis without myelopathy or radiculopathy, lumbar region: Secondary | ICD-10-CM | POA: Diagnosis not present

## 2020-03-03 DIAGNOSIS — M5416 Radiculopathy, lumbar region: Secondary | ICD-10-CM | POA: Diagnosis not present

## 2020-03-03 DIAGNOSIS — S14125S 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 of cervical spinal cord, sequela: Secondary | ICD-10-CM | POA: Insufficient documentation
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
## 2020-03-03 NOTE — Patient Instructions (Signed)
PLEASE FEEL FREE TO CALL OUR OFFICE WITH ANY PROBLEMS OR QUESTIONS ([phone])      

## 2020-03-11 ENCOUNTER — Ambulatory Visit: Payer: 59 | Admitting: Physical Therapy

## 2020-03-11 ENCOUNTER — Other Ambulatory Visit: Payer: Self-pay

## 2020-03-11 DIAGNOSIS — M62838 Other muscle spasm: Secondary | ICD-10-CM | POA: Diagnosis not present

## 2020-03-11 DIAGNOSIS — M5412 Radiculopathy, cervical region: Secondary | ICD-10-CM

## 2020-03-11 DIAGNOSIS — M545 Low back pain, unspecified: Secondary | ICD-10-CM

## 2020-03-11 DIAGNOSIS — M6281 Muscle weakness (generalized): Secondary | ICD-10-CM | POA: Diagnosis not present

## 2020-03-11 DIAGNOSIS — R278 Other lack of coordination: Secondary | ICD-10-CM | POA: Diagnosis not present

## 2020-03-11 DIAGNOSIS — R2689 Other abnormalities of gait and mobility: Secondary | ICD-10-CM | POA: Diagnosis not present

## 2020-03-11 DIAGNOSIS — R29898 Other symptoms and signs involving the musculoskeletal system: Secondary | ICD-10-CM | POA: Diagnosis not present

## 2020-03-11 DIAGNOSIS — R2681 Unsteadiness on feet: Secondary | ICD-10-CM | POA: Diagnosis not present

## 2020-03-11 MED FILL — PREGABALIN 100 MG CAPS: 100 | 30 days supply | Qty: 60 | Fill #1

## 2020-03-11 NOTE — Therapy (Signed)
[facility] [address] [place], [place], [address] Phone: [phone]   Fax:  [phone]  Physical Therapy Treatment  Patient Details  Name: Kelly Wall MRN: [id number] Date of Birth: 05-22-1961 Referring Provider (PT): Coletta Memos, MD   Encounter Date: 03/11/2020  PT End of Session - 03/11/20 1505    Visit Number  2    Number of Visits  21    Date for PT Re-Evaluation  04/01/20    PT Start Time  1503    PT Stop Time  1545    PT Time Calculation (min)  42 min    Activity Tolerance  Patient tolerated treatment well    Behavior During Therapy  [facility] for tasks assessed/performed       Past Medical History:  Diagnosis Date  . Asthma    child- occ exersie induced now  . Family history of adverse reaction to anesthesia    dad hard to awaken  . Hypertension    no rx  in 5 yrs fish oil helps now  . Paralysis [facility])     Past Surgical History:  Procedure Laterality Date  . ANTERIOR CERVICAL DECOMP/DISCECTOMY FUSION N/A 03/30/2018   Procedure: ANTERIOR CERVICAL DECOMPRESSION/DISCECTOMY FUSION Cervical four-five and Cervical five-six;  Surgeon: Coletta Memos, MD;  Location: [facility] OR;  Service: Neurosurgery;  Laterality: N/A;  . BACK SURGERY    . CERVICAL DISC ARTHROPLASTY N/A 04/20/2016   Procedure: Cervical Six-Seven Artificial Disc Replacement-Cervical;  Surgeon: Tia Alert, MD;  Location: [facility]UR[facility];  Service: Neurosurgery;  Laterality: N/A;    There were no vitals filed for this visit.  Subjective Assessment - 03/11/20 1506    Subjective  " I'm good and my neck is good. I have nerve pain in my arm and it hurts right now, it's like a shooting pain. My back is hurting really bad."    Limitations  Lifting    Currently in Pain?  Yes    Pain Score  6     Pain Location  Neck    Pain Orientation  Medial    Pain Descriptors / Indicators  Tightness;Numbness;Sharp;Constant;Aching    Pain Type  Chronic pain    Pain Onset   More than a month ago    Pain Frequency  Constant    Aggravating Factors   Reaching, lifting    Pain Relieving Factors  medicine    Effect of Pain on Daily Activities  Driving, lifting    Multiple Pain Sites  Yes    Pain Score  7    Pain Location  Arm    Pain Orientation  Right    Pain Score  5    Pain Location  Back    Pain Orientation  Lower         [facility] PT Assessment - 03/11/20 0001      ROM / Strength   AROM / PROM / Strength  Strength      Strength   Strength Assessment Site  Hand    Right/Left Shoulder  Right;Left    Right/Left hand  Right;Left    Right Hand Grip (lbs)  25    Left Hand Grip (lbs)  28                   [facility] Adult PT Treatment/Exercise - 03/11/20 0001      Exercises   Exercises  Lumbar      Neck Exercises: Machines for Strengthening   Nustep  L8 x 4 mins       Lumbar Exercises: Stretches   Active Hamstring Stretch  Right;Left;1 rep;60 seconds    Active Hamstring Stretch Limitations  w/ strap      Lumbar Exercises: Standing   Heel Raises  20 reps;1 second   w/ both hands on physio ball    Other Standing Lumbar Exercises  Physio ball press to engage core       Lumbar Exercises: Seated   Other Seated Lumbar Exercises  Hip Flexion/Bending mechanic       Shoulder Exercises: Standing   Horizontal ABduction  AAROM;Both;20 reps   and adduction w/ physio ball    Row  Strengthening;Both   2 sets of 10 w/ regular BOS and staggered stance    Theraband Level (Shoulder Row)  Level 2 (Red)    Row Limitations  Guarded by PT    Diagonals  Strengthening;Both   w/ staggered stance    Theraband Level (Shoulder Diagonals)  Level 1 (Yellow)    Diagonals Limitations  Guarded by PT    Other Standing Exercises  --    Other Standing Exercises  --             PT Education - 03/11/20 1606    Education Details  danger of driving- I suggest transportation or friend, theracane, posture with core activation & neck pain    Person(s) Educated   Patient    Methods  Explanation;Demonstration;Tactile cues;Verbal cues;Handout    Comprehension  Verbalized understanding;Returned demonstration;Verbal cues required;Tactile cues required;Need further instruction       PT Short Term Goals - 02/26/20 1254      PT SHORT TERM GOAL #1   Title  Patient will be able to demonstrate proper posture w/o being cued.    Baseline  Forward head, rounded shoulders    Time  5    Period  Weeks    Status  New    Target Date  04/01/20        PT Long Term Goals - 02/26/20 1255      PT LONG TERM GOAL #1   Title  Patient will report that she rarely drops objects due to muscle weakness in order to decrease the amount of bending and lifting that she has to perform    Baseline  Pt. reports that muscle weakness causes her to drop objects    Time  10    Period  Weeks    Status  New    Target Date  05/06/20      PT LONG TERM GOAL #2   Title  Patient will comfortably achieve 60 degrees of cervical rotation in order to decrease the amount of upper body twisting when driving.    Baseline  Rt. 60/Lt. 56    Time  10    Period  Weeks    Status  New            Plan - 03/11/20 1540    Clinical Impression Statement  Patient presents to the clinic with chronic neck and arm pain. Although she reported a higher level of pain in the upper quadrant, she is more concerned about her lower back. Session focused on incorporating exercises that targeted multiple body parts. She tolerated the session well. Patient would benefit from PT to further address pain and endurance. Poor core activation resulting in postural abnormalities in both cervical and lumbar spine. Sent message to referring MD with request to add treatment of gross deconditioning to  POC.    Personal Factors and Comorbidities  Comorbidity 3+    Comorbidities  HTN, lumbar fusion, paralysis    Stability/Clinical Decision Making  Unstable/Unpredictable    Clinical Decision Making  High    Rehab  Potential  Good    PT Frequency  2x / week    PT Duration  Other (comment)    PT Treatment/Interventions  ADLs/Self Care Home Management;Cryotherapy;Electrical Stimulation;Iontophoresis 4mg /ml Dexamethasone;Moist Heat;Traction;Ultrasound;Stair training;Gait training;Functional mobility training;Therapeutic exercise;Therapeutic activities;Balance training;Neuromuscular re-education;Patient/family education;Manual techniques;Scar mobilization;Passive range of motion;Dry needling;Energy conservation;Taping;Visual/perceptual remediation/compensation;Joint Manipulations    PT Next Visit Plan  check for new referral, continue with core strength & periscap activaiton, hip hinging    PT Home Exercise Plan  Cervical Isometric holds, Upper trap and levator stretches, Scapular retraction    Consulted and Agree with Plan of Care  Patient       Patient will benefit from skilled therapeutic intervention in order to improve the following deficits and impairments:  Decreased activity tolerance, Decreased coordination, Decreased endurance, Decreased range of motion, Impaired flexibility, Improper body mechanics, Postural dysfunction, Pain  Visit Diagnosis: Radiculopathy, cervical region  Acute right-sided low back pain without sciatica  Other muscle spasm  Muscle weakness (generalized)     Problem List Patient Active Problem List   Diagnosis Date Noted  . Spondylolisthesis of lumbar region 01/30/2020  . Lumbar radiculopathy 10/15/2019  . Spondylosis of lumbar spine 08/06/2019  . Lumbar facet arthropathy 02/05/2019  . Cervical myelopathy (HCC) 04/02/2018  . Bilateral arm weakness   . Numbness and tingling in both hands   . Paresthesia and pain of both upper extremities   . Trauma   . Post-operative pain   . Uncomplicated asthma   . Benign essential HTN   . Bradycardia   . Steroid-induced hyperglycemia   . 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 of cervical spinal cord (HCC) 03/30/2018  . S/P  cervical spinal fusion 04/20/2016  . Cervical radicular pain 03/22/2016  . Hamstring tightness 12/11/2014  . 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 Pain in joint, upper arm 11/17/2014  . Labyrinthitis 05/15/2012  . Sinusitis acute 02/12/2012  . Well adult exam 06/27/2011  . Rash, skin 06/27/2011  . ADHESIVE CAPSULITIS, LEFT 11/09/2009  . SHOULDER PAIN 09/09/2009  . HAIR LOSS 07/24/2008  . ELEVATED BP 07/24/2008  . CARPAL TUNNEL SYNDROME 03/23/2008  . PARESTHESIA 03/23/2008  . MIGRAINE HEADACHE 08/02/2007  Yareth Kearse C. Dorothyann Mourer PT, DPT 03/11/20 4:11 PM   [facility] [address] [place], [place], [place] Phone: [phone]   Fax:  [phone]  Name: Kelly Wall MRN: [id number] Date of Birth: 04/12/1961

## 2020-03-16 ENCOUNTER — Ambulatory Visit: Payer: 59 | Admitting: Physical Therapy

## 2020-03-16 ENCOUNTER — Other Ambulatory Visit: Payer: Self-pay

## 2020-03-16 ENCOUNTER — Encounter: Payer: Self-pay | Admitting: Physical Therapy

## 2020-03-16 DIAGNOSIS — M62838 Other muscle spasm: Secondary | ICD-10-CM

## 2020-03-16 DIAGNOSIS — M6281 Muscle weakness (generalized): Secondary | ICD-10-CM

## 2020-03-16 DIAGNOSIS — R2681 Unsteadiness on feet: Secondary | ICD-10-CM | POA: Diagnosis not present

## 2020-03-16 DIAGNOSIS — M5412 Radiculopathy, cervical region: Secondary | ICD-10-CM

## 2020-03-16 DIAGNOSIS — R278 Other lack of coordination: Secondary | ICD-10-CM | POA: Diagnosis not present

## 2020-03-16 DIAGNOSIS — R2689 Other abnormalities of gait and mobility: Secondary | ICD-10-CM | POA: Diagnosis not present

## 2020-03-16 DIAGNOSIS — R29898 Other symptoms and signs involving the musculoskeletal system: Secondary | ICD-10-CM | POA: Diagnosis not present

## 2020-03-16 DIAGNOSIS — M545 Low back pain: Secondary | ICD-10-CM | POA: Diagnosis not present

## 2020-03-16 NOTE — Therapy (Addendum)
[place], [place], [address] Phone: [phone]   Fax:  [phone]  Physical Therapy Treatment  Patient Details  Name: Kelly Wall MRN: [id number] Date of Birth: [birth year] Referring Provider (PT): Ashok Pall, MD   Encounter Date: 03/16/2020  PT End of Session - 03/16/20 1017    Visit Number  3    Number of Visits  21    Date for PT Re-Evaluation  04/01/20    PT Start Time  1018    PT Stop Time  1100    PT Time Calculation (min)  42 min    Equipment Utilized During Treatment  Gait belt    Activity Tolerance  Patient tolerated treatment well    Behavior During Therapy  [facility] for tasks assessed/performed       Past Medical History:  Diagnosis Date  . Asthma    child- occ exersie induced now  . Family history of adverse reaction to anesthesia    dad hard to awaken  . Hypertension    no rx  in 5 yrs fish oil helps now  . Paralysis [facility])     Past Surgical History:  Procedure Laterality Date  . ANTERIOR CERVICAL DECOMP/DISCECTOMY FUSION N/A 03/30/2018   Procedure: ANTERIOR CERVICAL DECOMPRESSION/DISCECTOMY FUSION Cervical four-five and Cervical five-six;  Surgeon: Ashok Pall, MD;  Location: [place];  Service: Neurosurgery;  Laterality: N/A;  . BACK SURGERY    . CERVICAL DISC ARTHROPLASTY N/A 04/20/2016   Procedure: Cervical Six-Seven Artificial Disc Replacement-Cervical;  Surgeon: Eustace Moore, MD;  Location: [place] [facility];  Service: Neurosurgery;  Laterality: N/A;    There were no vitals filed for this visit.  Subjective Assessment - 03/16/20 1021    Subjective  Patient reports that she walked for 2.5 miles on Friday and Sunday. She has also been walking the stairs and using her stationary bike, but wants to know when she can start riding her tricycle. She states that she stops her workouts early due to balance. Additionally, patient indicates that she "will always have pain in her arms and  neck."    Pertinent History  Patient broke her neck 2 years ago, lumbar fusion    Patient Stated Goals  Get stronger, and be safer with twisting    Currently in Pain?  Yes    Pain Score  5     Pain Location  Neck    Pain Orientation  Medial    Pain Descriptors / Indicators  Numbness;Tightness;Sharp;Constant;Aching    Pain Type  Chronic pain    Pain Radiating Towards  Shoulder, arms, and hand    Pain Onset  More than a month ago    Pain Frequency  Constant    Aggravating Factors   Reaching, lifting    Pain Relieving Factors  medicine    Effect of Pain on Daily Activities  Driving, lifting    Multiple Pain Sites  Yes    Pain Score  6    Pain Location  Arm    Pain Orientation  Right    Pain Score  6    Pain Location  Back    Pain Orientation  Lower                       [facility] Adult PT Treatment/Exercise - 03/16/20 0001      Neuro Re-ed    Neuro Re-ed Details   Tadem standing Cervical ROM and UE movement  Shoulder flexion and horizontal abduction, cervical rotation     Exercises   Exercises  Knee/Hip      Neck Exercises: Machines for Strengthening   Nustep  L8 x 5 mins       Neck Exercises: Theraband   Scapula Retraction  Blue   3 sets of 10, Standing   Shoulder External Rotation  20 reps;Green      Knee/Hip Exercises: Standing   Knee Flexion  AROM;Strengthening;Both;1 set;10 reps    Hip Abduction  Stengthening;2 sets;Both;10 reps    Hip Extension  Stengthening;Both;2 sets;10 reps               PT Short Term Goals - 02/26/20 1254      PT SHORT TERM GOAL #1   Title  Patient will be able to demonstrate proper posture w/o being cued.    Baseline  Forward head, rounded shoulders    Time  5    Period  Weeks    Status  New    Target Date  04/01/20        PT Long Term Goals - 02/26/20 1255      PT LONG TERM GOAL #1   Title  Patient will report that she rarely drops objects due to muscle weakness in order to decrease the amount of bending  and lifting that she has to perform    Baseline  Pt. reports that muscle weakness causes her to drop objects    Time  10    Period  Weeks    Status  New    Target Date  05/06/20      PT LONG TERM GOAL #2   Title  Patient will comfortably achieve 60 degrees of cervical rotation in order to decrease the amount of upper body twisting when driving.    Baseline  Rt. 60/Lt. 56    Time  10    Period  Weeks    Status  New            Plan - 03/16/20 1103    Clinical Impression Statement  Patient reports to the clinic with constant arm and neck pain. She feels as if it will not get any better. She has some deficits with balance and LE strength. Today's session focused on incorporating balancing activities with cervical and UE ROM and strengthening. She reports that she has been complying with her HEP and is getting stronger. Patient would benefit from PT to further address neck/UE pain and strength.    Personal Factors and Comorbidities  Comorbidity 3+    Comorbidities  HTN, lumbar fusion, paralysis    Examination-Activity Limitations  Carry;Lift;Reach Overhead    Examination-Participation Restrictions  Other;Driving    Stability/Clinical Decision Making  Unstable/Unpredictable    Clinical Decision Making  High    Rehab Potential  Good    PT Frequency  2x / week    PT Duration  Other (comment)    PT Treatment/Interventions  ADLs/Self Care Home Management;Cryotherapy;Electrical Stimulation;Iontophoresis 4mg /ml Dexamethasone;Moist Heat;Traction;Ultrasound;Stair training;Gait training;Functional mobility training;Therapeutic exercise;Therapeutic activities;Balance training;Neuromuscular re-education;Patient/family education;Manual techniques;Scar mobilization;Passive range of motion;Dry needling;Energy conservation;Taping;Visual/perceptual remediation/compensation;Joint Manipulations    PT Next Visit Plan  Assess balance and gait, balance training, core, lower back exercises    PT Home Exercise  Plan  Cervical Isometric holds, Upper trap and levator stretches, Scapular retraction, Standing Hip abd. and ext    Consulted and Agree with Plan of Care  Patient       Patient will benefit from skilled therapeutic  intervention in order to improve the following deficits and impairments:  Decreased activity tolerance, Decreased coordination, Decreased endurance, Decreased range of motion, Impaired flexibility, Improper body mechanics, Postural dysfunction, Pain  Visit Diagnosis: Radiculopathy, cervical region  Other muscle spasm  Muscle weakness (generalized)     Problem List Patient Active Problem List   Diagnosis Date Noted  . Spondylolisthesis of lumbar region 01/30/2020  . Lumbar radiculopathy 10/15/2019  . Spondylosis of lumbar spine 08/06/2019  . Lumbar facet arthropathy 02/05/2019  . Cervical myelopathy (HCC) 04/02/2018  . Bilateral arm weakness   . Numbness and tingling in both hands   . Paresthesia and pain of both upper extremities   . Trauma   . Post-operative pain   . Uncomplicated asthma   . Benign essential HTN   . Bradycardia   . Steroid-induced hyperglycemia   . 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 of cervical spinal cord (HCC) 03/30/2018  . S/P cervical spinal fusion 04/20/2016  . Cervical radicular pain 03/22/2016  . Hamstring tightness 12/11/2014  . 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 Pain in joint, upper arm 11/17/2014  . Labyrinthitis 05/15/2012  . Sinusitis acute 02/12/2012  . Well adult exam 06/27/2011  . Rash, skin 06/27/2011  . ADHESIVE CAPSULITIS, LEFT 11/09/2009  . SHOULDER PAIN 09/09/2009  . HAIR LOSS 07/24/2008  . ELEVATED BP 07/24/2008  . CARPAL TUNNEL SYNDROME 03/23/2008  . PARESTHESIA 03/23/2008  . MIGRAINE HEADACHE 08/02/2007    Toniann Fail 03/16/2020, 11:38 AM  [facility] [address] [place], [place], [address] Phone: [phone]   Fax:  [phone]  Name: Kelly Wall MRN: [id number] Date of Birth: 1961/01/22

## 2020-03-19 ENCOUNTER — Other Ambulatory Visit: Payer: Self-pay

## 2020-03-19 ENCOUNTER — Encounter: Payer: Self-pay | Admitting: Physical Therapy

## 2020-03-19 ENCOUNTER — Ambulatory Visit: Payer: 59 | Admitting: Physical Therapy

## 2020-03-19 DIAGNOSIS — M62838 Other muscle spasm: Secondary | ICD-10-CM

## 2020-03-19 DIAGNOSIS — M6281 Muscle weakness (generalized): Secondary | ICD-10-CM | POA: Diagnosis not present

## 2020-03-19 DIAGNOSIS — M545 Low back pain, unspecified: Secondary | ICD-10-CM

## 2020-03-19 DIAGNOSIS — R29898 Other symptoms and signs involving the musculoskeletal system: Secondary | ICD-10-CM | POA: Diagnosis not present

## 2020-03-19 DIAGNOSIS — R278 Other lack of coordination: Secondary | ICD-10-CM | POA: Diagnosis not present

## 2020-03-19 DIAGNOSIS — M5412 Radiculopathy, cervical region: Secondary | ICD-10-CM | POA: Diagnosis not present

## 2020-03-19 DIAGNOSIS — R2681 Unsteadiness on feet: Secondary | ICD-10-CM | POA: Diagnosis not present

## 2020-03-19 DIAGNOSIS — R2689 Other abnormalities of gait and mobility: Secondary | ICD-10-CM | POA: Diagnosis not present

## 2020-03-19 NOTE — Therapy (Signed)
[facility] Outpatient Rehabilitation [facility] [address] [place], [place], [address] Phone: [phone]   Fax:  [phone]  Physical Therapy Treatment  Patient Details  Name: Kelly Wall MRN: [id number] Date of Birth: 03-Oct-1961 Referring Provider (PT): Coletta Memos, MD   Encounter Date: 03/19/2020  PT End of Session - 03/19/20 1020    Visit Number  4    Number of Visits  21    Date for PT Re-Evaluation  04/01/20    PT Start Time  1018    PT Stop Time  1103    PT Time Calculation (min)  45 min    Equipment Utilized During Treatment  Gait belt    Activity Tolerance  Patient tolerated treatment well    Behavior During Therapy  [facility] for tasks assessed/performed       Past Medical History:  Diagnosis Date  . Asthma    child- occ exersie induced now  . Family history of adverse reaction to anesthesia    dad hard to awaken  . Hypertension    no rx  in 5 yrs fish oil helps now  . Paralysis [facility])     Past Surgical History:  Procedure Laterality Date  . ANTERIOR CERVICAL DECOMP/DISCECTOMY FUSION N/A 03/30/2018   Procedure: ANTERIOR CERVICAL DECOMPRESSION/DISCECTOMY FUSION Cervical four-five and Cervical five-six;  Surgeon: Coletta Memos, MD;  Location: [facility] OR;  Service: Neurosurgery;  Laterality: N/A;  . BACK SURGERY    . CERVICAL DISC ARTHROPLASTY N/A 04/20/2016   Procedure: Cervical Six-Seven Artificial Disc Replacement-Cervical;  Surgeon: Tia Alert, MD;  Location: [facility];  Service: Neurosurgery;  Laterality: N/A;    There were no vitals filed for this visit.  Subjective Assessment - 03/19/20 1020    Subjective  Patient reports that she is in pain (around glute area) after she rode her tricycle for 5 miles. She also reports that she has been working on her balance and trying to rest more.    Limitations  Lifting    Currently in Pain?  Yes    Pain Score  5     Pain Location  Neck    Pain Orientation  Medial    Pain Descriptors /  Indicators  Numbness;Tightness;Sharp;Constant;Aching    Pain Type  Chronic pain    Pain Radiating Towards  shoulder, arms and hand    Pain Onset  More than a month ago    Pain Frequency  Constant    Aggravating Factors   Reaching, lifting    Pain Relieving Factors  medicine    Effect of Pain on Daily Activities  Driving, lifting    Multiple Pain Sites  Yes    Pain Score  6    Pain Location  Arm    Pain Orientation  Right    Pain Score  5    Pain Location  Back    Pain Orientation  Lower         [facility] PT Assessment - 03/19/20 0001      6 Minute walk- Post Test   6 Minute Walk Post Test  yes      6 minute walk test results    Aerobic Endurance Distance Walked  589    Endurance additional comments  Deconditioned compared to running 3x/week for 3 mile , biking 3x/ week (50 miles/ week), push-ups, 5 min planks       Standardized Balance Assessment   Standardized Balance Assessment  Berg Balance Test;Five Times Sit to Stand  Five times sit to stand comments   12 sec      Berg Balance Test   Sit to Stand  Able to stand  independently using hands    Standing Unsupported  Able to stand safely 2 minutes    Sitting with Back Unsupported but Feet Supported on Floor or Stool  Able to sit safely and securely 2 minutes    Stand to Sit  Sits safely with minimal use of hands    Transfers  Able to transfer safely, definite need of hands    Standing Unsupported with Eyes Closed  Able to stand 10 seconds safely    Standing Unsupported with Feet Together  Able to place feet together independently and stand 1 minute safely    From Standing, Reach Forward with Outstretched Arm  Can reach forward >12 cm safely (5")    From Standing Position, Pick up Object from Floor  Unable to pick up and needs supervision    From Standing Position, Turn to Look Behind Over each Shoulder  Looks behind one side only/other side shows less weight shift    Turn 360 Degrees  Able to turn 360 degrees safely but  slowly    Standing Unsupported, Alternately Place Feet on Step/Stool  Able to stand independently and complete 8 steps >20 seconds    Standing Unsupported, One Foot in Front  Able to take small step independently and hold 30 seconds    Standing on One Leg  Able to lift leg independently and hold 5-10 seconds   Rt. 5 secs, Lt. 10 secs    Total Score  43                   [facility] Adult PT Treatment/Exercise - 03/19/20 0001      Knee/Hip Exercises: Standing   Functional Squat  3 sets;10 reps   W/ yellow TheraBand, side stepping      Manual Therapy   Manual Therapy  Soft tissue mobilization    Soft tissue mobilization  right SI join, right piriformis, right glutes              PT Education - 03/19/20 1111    Education Details  Pt. educated on importance of modifying exercises and not pushing herself too far. We also talked about the necessity of stretching.    Person(s) Educated  Patient    Methods  Explanation;Demonstration    Comprehension  Verbalized understanding       PT Short Term Goals - 02/26/20 1254      PT SHORT TERM GOAL #1   Title  Patient will be able to demonstrate proper posture w/o being cued.    Baseline  Forward head, rounded shoulders    Time  5    Period  Weeks    Status  New    Target Date  04/01/20        PT Long Term Goals - 02/26/20 1255      PT LONG TERM GOAL #1   Title  Patient will report that she rarely drops objects due to muscle weakness in order to decrease the amount of bending and lifting that she has to perform    Baseline  Pt. reports that muscle weakness causes her to drop objects    Time  10    Period  Weeks    Status  New    Target Date  05/06/20      PT LONG TERM GOAL #2   Title  Patient will comfortably achieve 60 degrees of cervical rotation in order to decrease the amount of upper body twisting when driving.    Baseline  Rt. 60/Lt. 78    Time  [place]    Status  New            Plan -  03/19/20 1117    Clinical Impression Statement  Kelly Wall presents to the clinic with constant UE and back pain. She reports that she was able to ride her tricycle. She requires assistance with more demanding balancing activities, such as bending and SLS. She score a 43 on her BERG balance test, walk 589 ft during her 77min walk test, and performed a 12 sec 5 TSTS. Kelly Wall has decreased endurance and apparent gait deficits (decreased step length and arm swing). Manual therapy was performed around the SIJ and Glute region; patient indicated that she felt better afterward. We spent some time talking about modifying activity level and the importance of stretching.    Personal Factors and Comorbidities  Comorbidity 3+    Comorbidities  HTN, lumbar fusion, paralysis    Examination-Activity Limitations  Carry;Lift;Reach Overhead    Examination-Participation Restrictions  Other;Driving    Stability/Clinical Decision Making  Unstable/Unpredictable    Clinical Decision Making  High    Rehab Potential  Fair    PT Frequency  2x / week    PT Duration  Other (comment)    PT Treatment/Interventions  ADLs/Self Care Home Management;Cryotherapy;Electrical Stimulation;Iontophoresis 4mg /ml Dexamethasone;Moist Heat;Traction;Ultrasound;Stair training;Gait training;Functional mobility training;Therapeutic exercise;Therapeutic activities;Balance training;Neuromuscular re-education;Patient/family education;Manual techniques;Scar mobilization;Passive range of motion;Dry needling;Energy conservation;Taping;Visual/perceptual remediation/compensation;Joint Manipulations    PT Next Visit Plan  Review STGs, and cervical ROM/ strength, Balance, gait, UE strengthening    PT Home Exercise Plan  Cervical Isometric holds, Upper trap and levator stretches, Scapular retraction, Standing Hip abd. and ext [id number]    Consulted and Agree with Plan of Care  Patient       Patient will benefit from skilled therapeutic intervention in order to  improve the following deficits and impairments:  Decreased activity tolerance, Decreased coordination, Decreased endurance, Decreased range of motion, Impaired flexibility, Improper body mechanics, Postural dysfunction, Pain  Visit Diagnosis: Radiculopathy, cervical region  Other muscle spasm  Muscle weakness (generalized)  Other lack of coordination  Unsteadiness on feet  Acute right-sided low back pain without sciatica  Other abnormalities of gait and mobility     Problem List Patient Active Problem List   Diagnosis Date Noted  . Spondylolisthesis of lumbar region 01/30/2020  . Lumbar radiculopathy 10/15/2019  . Spondylosis of lumbar spine 08/06/2019  . Lumbar facet arthropathy 02/05/2019  . Cervical myelopathy ([place]) 04/02/2018  . Bilateral arm weakness   . Numbness and tingling in both hands   . Paresthesia and pain of both upper extremities   . Trauma   . Post-operative pain   . Uncomplicated asthma   . Benign essential HTN   . Bradycardia   . Steroid-induced hyperglycemia   . 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 of cervical spinal cord ([place]) 03/30/2018  . S/P cervical spinal fusion 04/20/2016  . Cervical radicular pain 03/22/2016  . Hamstring tightness 12/11/2014  . 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 Pain in joint, upper arm 11/17/2014  . Labyrinthitis 05/15/2012  . Sinusitis acute 02/12/2012  . Well adult exam 06/27/2011  . Rash, skin 06/27/2011  . ADHESIVE CAPSULITIS, LEFT 11/09/2009  . SHOULDER PAIN 09/09/2009  . HAIR LOSS 07/24/2008  . ELEVATED BP 07/24/2008  .  CARPAL TUNNEL SYNDROME 03/23/2008  . PARESTHESIA 03/23/2008  . MIGRAINE HEADACHE 08/02/2007    Cato Mulligan, SPT 03/19/2020, 12:18 PM  [facility] [address] [place], [place], [address] Phone: [phone]   Fax:  [phone]  Name: Kelly Wall MRN: [id number] Date of Birth: November 24, 1961

## 2020-03-23 ENCOUNTER — Ambulatory Visit: Payer: 59 | Admitting: Physical Therapy

## 2020-03-23 ENCOUNTER — Other Ambulatory Visit: Payer: Self-pay

## 2020-03-23 ENCOUNTER — Encounter: Payer: Self-pay | Admitting: Physical Therapy

## 2020-03-23 DIAGNOSIS — M6281 Muscle weakness (generalized): Secondary | ICD-10-CM

## 2020-03-23 DIAGNOSIS — M545 Low back pain, unspecified: Secondary | ICD-10-CM

## 2020-03-23 DIAGNOSIS — R2681 Unsteadiness on feet: Secondary | ICD-10-CM

## 2020-03-23 DIAGNOSIS — M5412 Radiculopathy, cervical region: Secondary | ICD-10-CM

## 2020-03-23 DIAGNOSIS — M62838 Other muscle spasm: Secondary | ICD-10-CM | POA: Diagnosis not present

## 2020-03-23 DIAGNOSIS — R29898 Other symptoms and signs involving the musculoskeletal system: Secondary | ICD-10-CM

## 2020-03-23 DIAGNOSIS — R278 Other lack of coordination: Secondary | ICD-10-CM | POA: Diagnosis not present

## 2020-03-23 DIAGNOSIS — R2689 Other abnormalities of gait and mobility: Secondary | ICD-10-CM

## 2020-03-23 NOTE — Therapy (Signed)
[facility] Outpatient Rehabilitation [facility] [address] [place], [place], [address] Phone: [phone]   Fax:  [phone]  Physical Therapy Treatment  Patient Details  Name: Kelly Wall MRN: [id number] Date of Birth: [birth year] Referring Provider (PT): Coletta Memos, MD   Encounter Date: 03/23/2020  PT End of Session - 03/23/20 1018    Visit Number  5    Number of Visits  21    Date for PT Re-Evaluation  04/01/20    PT Start Time  1018    PT Stop Time  1101    PT Time Calculation (min)  43 min    Activity Tolerance  Patient tolerated treatment well    Behavior During Therapy  [facility] for tasks assessed/performed       Past Medical History:  Diagnosis Date  . Asthma    child- occ exersie induced now  . Family history of adverse reaction to anesthesia    dad hard to awaken  . Hypertension    no rx  in 5 yrs fish oil helps now  . Paralysis [facility])     Past Surgical History:  Procedure Laterality Date  . ANTERIOR CERVICAL DECOMP/DISCECTOMY FUSION N/A 03/30/2018   Procedure: ANTERIOR CERVICAL DECOMPRESSION/DISCECTOMY FUSION Cervical four-five and Cervical five-six;  Surgeon: Coletta Memos, MD;  Location: [facility] OR;  Service: Neurosurgery;  Laterality: N/A;  . BACK SURGERY    . CERVICAL DISC ARTHROPLASTY N/A 04/20/2016   Procedure: Cervical Six-Seven Artificial Disc Replacement-Cervical;  Surgeon: Tia Alert, MD;  Location: [facility];  Service: Neurosurgery;  Laterality: N/A;    There were no vitals filed for this visit.  Subjective Assessment - 03/23/20 1018    Subjective  "I am still having issues in the back of the R hip which started after doing more riding on the tricycle I felt more soreness. I think I am getting better overall"    Currently in Pain?  Yes    Pain Score  4     Pain Location  Neck    Pain Radiating Towards  down both arms down to the elbows    Pain Onset  More than a month ago    Pain Frequency  Constant    Pain Score  5     Pain Location  Hip    Pain Orientation  Right;Posterior    Pain Score  6                       [facility] Adult PT Treatment/Exercise - 03/23/20 0001      Neck Exercises: Supine   Neck Retraction  10 reps;5 secs   tactile cues for proper form     Lumbar Exercises: Stretches   Hip Flexor Stretch  2 reps;60 seconds;Right      Knee/Hip Exercises: Seated   Hamstring Curl  Both;10 reps;2 sets;Strengthening      Knee/Hip Exercises: Supine   Hip Adduction Isometric  Strengthening;Both;2 sets;10 reps   holding     Shoulder Exercises: Seated   Retraction Limitations  bil scapular retraction with ER 2 x 10     Row  Strengthening;10 reps    Other Seated Exercises  bicep curl 2 x 10 bil 3#    focus on eccentric loading   Other Seated Exercises  scapular retraction 2 x 10 holding 5 sec      Manual Therapy   Manual therapy comments  MTPR along the mid to proximal rectus femoris  Soft tissue mobilization  IASTM along along the proximal rectus femoris and along the R lumbar paraspinals               PT Short Term Goals - 03/23/20 1129      PT SHORT TERM GOAL #1   Title  Patient will be able to demonstrate proper posture w/o being cued.    Period  Weeks    Status  On-going      PT SHORT TERM GOAL #2   Title  Patient will report that she was able to find comfortable stretching positions that do not put a lot of stress on her back    Period  Weeks    Status  On-going      PT SHORT TERM GOAL #3   Title  Patient will be able to increase her 6 min walk test distance by 50 ft    Period  Weeks    Status  Unable to assess        PT Long Term Goals - 03/19/20 1252      PT LONG TERM GOAL #1   Title  Patient will report that she rarely drops objects due to muscle weakness in order to decrease the amount of bending and lifting that she has to perform    Baseline  Pt. reports that muscle weakness causes her to drop objects    Time  10    Period  Weeks    Status   New    Target Date  05/06/20      PT LONG TERM GOAL #2   Title  Patient will comfortably achieve 60 degrees of cervical rotation in order to decrease the amount of upper body twisting when driving.    Baseline  Rt. 60/Lt. 56    Time  10    Period  Weeks    Status  New    Target Date  05/06/20      PT LONG TERM GOAL #3   Title  Patient will be able to score >/= a 50 on her BERG balance test in order to reduce unsteadiness and antalgic gait when walking    Baseline  43 BERG score - 03/19/2020    Time  7    Period  Weeks    Status  New    Target Date  05/06/20      PT LONG TERM GOAL #4   Title  Patient will be able to safely bend over and pick up object from the floor with no assistance so that she can perform household activities and chores.    Baseline  Patient his unable to pick up objects from the floor without assistance.    Time  7    Period  Weeks    Target Date  05/06/20      PT LONG TERM GOAL #5   Title  Patient will report a max 5/10 LBP so that she is able to ride her tricycle with less difficulty    Time  7    Period  Weeks    Status  New    Target Date  05/06/20            Plan - 03/23/20 1125    Clinical Impression Statement  pt reports continued pain inthe back of the R hip which she notes is exacerbated with walking. continued STW along the rectus femoris combined with stretching which she responded well to. worked on hamstring strengthening to promote SIJ stability  on the R.  continued working DNF and posterior shoulder which she did well but did fatigue quickly with. end of session she reported feeling sore but states she felt better with walking/ standing.    PT Next Visit Plan  Review STGs, and cervical ROM/ strength, Balance, gait, UE strengthening, update HEP for hamstring activation.       Patient will benefit from skilled therapeutic intervention in order to improve the following deficits and impairments:  Decreased activity tolerance, Decreased  coordination, Decreased endurance, Decreased range of motion, Impaired flexibility, Improper body mechanics, Postural dysfunction, Pain  Visit Diagnosis: Radiculopathy, cervical region  Other muscle spasm  Muscle weakness (generalized)  Other lack of coordination  Unsteadiness on feet  Acute right-sided low back pain without sciatica  Other abnormalities of gait and mobility  Other symptoms and signs involving the musculoskeletal system     Problem List Patient Active Problem List   Diagnosis Date Noted  . Spondylolisthesis of lumbar region 01/30/2020  . Lumbar radiculopathy 10/15/2019  . Spondylosis of lumbar spine 08/06/2019  . Lumbar facet arthropathy 02/05/2019  . Cervical myelopathy ([place]) 04/02/2018  . Bilateral arm weakness   . Numbness and tingling in both hands   . Paresthesia and pain of both upper extremities   . Trauma   . Post-operative pain   . Uncomplicated asthma   . Benign essential HTN   . Bradycardia   . Steroid-induced hyperglycemia   . 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 of cervical spinal cord ([place]) 03/30/2018  . S/P cervical spinal fusion 04/20/2016  . Cervical radicular pain 03/22/2016  . Hamstring tightness 12/11/2014  . 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 Pain in joint, upper arm 11/17/2014  . Labyrinthitis 05/15/2012  . Sinusitis acute 02/12/2012  . Well adult exam 06/27/2011  . Rash, skin 06/27/2011  . ADHESIVE CAPSULITIS, LEFT 11/09/2009  . SHOULDER PAIN 09/09/2009  . HAIR LOSS 07/24/2008  . ELEVATED BP 07/24/2008  . CARPAL TUNNEL SYNDROME 03/23/2008  . PARESTHESIA 03/23/2008  . MIGRAINE HEADACHE 08/02/2007   [name] PT, DPT, LAT, ATC  03/23/20  11:32 AM       [facility] [address] [place], [place], [address] Phone: [phone]   Fax:  [phone]  Name: Kelly Wall MRN: [id number] Date of Birth: [birth year]

## 2020-03-26 ENCOUNTER — Ambulatory Visit: Payer: 59 | Admitting: Physical Therapy

## 2020-03-26 ENCOUNTER — Encounter: Payer: Self-pay | Admitting: Physical Therapy

## 2020-03-26 ENCOUNTER — Other Ambulatory Visit: Payer: Self-pay

## 2020-03-26 DIAGNOSIS — M5412 Radiculopathy, cervical region: Secondary | ICD-10-CM

## 2020-03-26 DIAGNOSIS — R2681 Unsteadiness on feet: Secondary | ICD-10-CM | POA: Diagnosis not present

## 2020-03-26 DIAGNOSIS — M545 Low back pain, unspecified: Secondary | ICD-10-CM

## 2020-03-26 DIAGNOSIS — M6281 Muscle weakness (generalized): Secondary | ICD-10-CM | POA: Diagnosis not present

## 2020-03-26 DIAGNOSIS — R278 Other lack of coordination: Secondary | ICD-10-CM | POA: Diagnosis not present

## 2020-03-26 DIAGNOSIS — R2689 Other abnormalities of gait and mobility: Secondary | ICD-10-CM | POA: Diagnosis not present

## 2020-03-26 DIAGNOSIS — M62838 Other muscle spasm: Secondary | ICD-10-CM | POA: Diagnosis not present

## 2020-03-26 DIAGNOSIS — R29898 Other symptoms and signs involving the musculoskeletal system: Secondary | ICD-10-CM | POA: Diagnosis not present

## 2020-03-26 NOTE — Therapy (Signed)
[place], [place], [address] Phone: [phone]   Fax:  [phone]  Physical Therapy Treatment  Patient Details  Name: Kelly Wall MRN: [id number] Date of Birth: [birth year] Referring Provider (PT): Ashok Pall, MD   Encounter Date: 03/26/2020  PT End of Session - 03/26/20 1017    Visit Number  6    Number of Visits  21    Date for PT Re-Evaluation  04/01/20    PT Start Time  1017   Patient late   PT Stop Time  1051    PT Time Calculation (min)  34 min    Activity Tolerance  Patient tolerated treatment well    Behavior During Therapy  [facility] for tasks assessed/performed       Past Medical History:  Diagnosis Date  . Asthma    child- occ exersie induced now  . Family history of adverse reaction to anesthesia    dad hard to awaken  . Hypertension    no rx  in 5 yrs fish oil helps now  . Paralysis [facility])     Past Surgical History:  Procedure Laterality Date  . ANTERIOR CERVICAL DECOMP/DISCECTOMY FUSION N/A 03/30/2018   Procedure: ANTERIOR CERVICAL DECOMPRESSION/DISCECTOMY FUSION Cervical four-five and Cervical five-six;  Surgeon: Ashok Pall, MD;  Location: [place];  Service: Neurosurgery;  Laterality: N/A;  . BACK SURGERY    . CERVICAL DISC ARTHROPLASTY N/A 04/20/2016   Procedure: Cervical Six-Seven Artificial Disc Replacement-Cervical;  Surgeon: Eustace Moore, MD;  Location: [place] [facility];  Service: Neurosurgery;  Laterality: N/A;    There were no vitals filed for this visit.  Subjective Assessment - 03/26/20 1018    Subjective  "I am still having pain, but doing better. Vania Rea hit all of the right pain spots."    Pertinent History  Patient broke her neck 2 years ago, lumbar fusion    Limitations  Lifting    Patient Stated Goals  Get stronger, and be safer with twisting    Pain Score  5     Pain Location  Arm    Pain Onset  More than a month ago    Pain Frequency  Constant    Pain Score   5    Pain Location  Hip    Pain Score  5    Pain Location  Back    Pain Orientation  Lower                       [facility] Adult PT Treatment/Exercise - 03/26/20 0001      Neck Exercises: Machines for Strengthening   Nustep  L6 x 5 mins       Knee/Hip Exercises: Stretches   Other Knee/Hip Stretches  Prone quad stretch hold for 30 secs       Knee/Hip Exercises: Prone   Hamstring Curl  1 set;10 reps   3 lbs      Manual Therapy   Manual therapy comments  MTPR along the mid to proximal rectus femoris    Soft tissue mobilization  Deep tissue massage with roller along rectus femoris              PT Education - 03/26/20 1200    Education Details  Pt educated on modifying exercises due to her extensive history of non-compliance of reps and sets    Person(s) Educated  Patient    Methods  Explanation    Comprehension  Verbalized understanding       PT Short Term Goals - 03/23/20 1129      PT SHORT TERM GOAL #1   Title  Patient will be able to demonstrate proper posture w/o being cued.    Period  Weeks    Status  On-going      PT SHORT TERM GOAL #2   Title  Patient will report that she was able to find comfortable stretching positions that do not put a lot of stress on her back    Period  Weeks    Status  On-going      PT SHORT TERM GOAL #3   Title  Patient will be able to increase her 6 min walk test distance by 50 ft    Period  Weeks    Status  Unable to assess        PT Long Term Goals - 03/19/20 1252      PT LONG TERM GOAL #1   Title  Patient will report that she rarely drops objects due to muscle weakness in order to decrease the amount of bending and lifting that she has to perform    Baseline  Pt. reports that muscle weakness causes her to drop objects    Time  10    Period  Weeks    Status  New    Target Date  05/06/20      PT LONG TERM GOAL #2   Title  Patient will comfortably achieve 60 degrees of cervical rotation in order to decrease  the amount of upper body twisting when driving.    Baseline  Rt. 60/Lt. 56    Time  10    Period  Weeks    Status  New    Target Date  05/06/20      PT LONG TERM GOAL #3   Title  Patient will be able to score >/= a 50 on her BERG balance test in order to reduce unsteadiness and antalgic gait when walking    Baseline  43 BERG score - 03/19/2020    Time  7    Period  Weeks    Status  New    Target Date  05/06/20      PT LONG TERM GOAL #4   Title  Patient will be able to safely bend over and pick up object from the floor with no assistance so that she can perform household activities and chores.    Baseline  Patient his unable to pick up objects from the floor without assistance.    Time  7    Period  Weeks    Target Date  05/06/20      PT LONG TERM GOAL #5   Title  Patient will report a max 5/10 LBP so that she is able to ride her tricycle with less difficulty    Time  7    Period  Weeks    Status  New    Target Date  05/06/20            Plan - 03/26/20 1150    Clinical Impression Statement  Patient presents to the clinic with continued back, neck, arm, and hip pain. She reports that she continues to ride her tricycle for 6 miles. She was advised to scale back the amount of riding and biking that she does. Today's session consisted of manual therapy and patient education. Time was limited due to patient's arrival time, as she thought the session  was scheduled for 10:15. She would benefit from PT to further address pain and strength deficits.    Personal Factors and Comorbidities  Comorbidity 3+    Comorbidities  HTN, lumbar fusion, paralysis    Examination-Activity Limitations  Carry;Lift;Reach Overhead    Examination-Participation Restrictions  Other;Driving    Stability/Clinical Decision Making  Unstable/Unpredictable    Clinical Decision Making  High    Rehab Potential  Fair    PT Frequency  2x / week    PT Duration  Other (comment)    PT Treatment/Interventions   ADLs/Self Care Home Management;Cryotherapy;Electrical Stimulation;Iontophoresis 4mg /ml Dexamethasone;Moist Heat;Traction;Ultrasound;Stair training;Gait training;Functional mobility training;Therapeutic exercise;Therapeutic activities;Balance training;Neuromuscular re-education;Patient/family education;Manual techniques;Scar mobilization;Passive range of motion;Dry needling;Energy conservation;Taping;Visual/perceptual remediation/compensation;Joint Manipulations    PT Next Visit Plan  Review STGs, and cervical ROM/ strength, Balance, gait, UE strengthening, update HEP for hamstring activation.    PT Home Exercise Plan  Cervical Isometric holds, Upper trap and levator stretches, Scapular retraction, Standing Hip abd. and ext [id number]    Consulted and Agree with Plan of Care  Patient       Patient will benefit from skilled therapeutic intervention in order to improve the following deficits and impairments:  Decreased activity tolerance, Decreased coordination, Decreased endurance, Decreased range of motion, Impaired flexibility, Improper body mechanics, Postural dysfunction, Pain  Visit Diagnosis: Muscle weakness (generalized)  Unsteadiness on feet  Acute right-sided low back pain without sciatica  Radiculopathy, cervical region  Other muscle spasm  Other lack of coordination     Problem List Patient Active Problem List   Diagnosis Date Noted  . Spondylolisthesis of lumbar region 01/30/2020  . Lumbar radiculopathy 10/15/2019  . Spondylosis of lumbar spine 08/06/2019  . Lumbar facet arthropathy 02/05/2019  . Cervical myelopathy (HCC) 04/02/2018  . Bilateral arm weakness   . Numbness and tingling in both hands   . Paresthesia and pain of both upper extremities   . Trauma   . Post-operative pain   . Uncomplicated asthma   . Benign essential HTN   . Bradycardia   . Steroid-induced hyperglycemia   . 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 of cervical spinal cord (HCC) 03/30/2018  . S/P  cervical spinal fusion 04/20/2016  . Cervical radicular pain 03/22/2016  . Hamstring tightness 12/11/2014  . 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 Pain in joint, upper arm 11/17/2014  . Labyrinthitis 05/15/2012  . Sinusitis acute 02/12/2012  . Well adult exam 06/27/2011  . Rash, skin 06/27/2011  . ADHESIVE CAPSULITIS, LEFT 11/09/2009  . SHOULDER PAIN 09/09/2009  . HAIR LOSS 07/24/2008  . ELEVATED BP 07/24/2008  . CARPAL TUNNEL SYNDROME 03/23/2008  . PARESTHESIA 03/23/2008  . MIGRAINE HEADACHE 08/02/2007    10/02/2007 , SPT 03/26/2020, 12:05 PM  [facility] [address] [place], [place], [place] Phone: [phone]   Fax:  [phone]  Name: Kelly Wall MRN: [id number] [id number] Date of Birth: 1961-01-07

## 2020-03-30 ENCOUNTER — Other Ambulatory Visit: Payer: Self-pay

## 2020-03-30 ENCOUNTER — Ambulatory Visit: Payer: 59 | Attending: Neurosurgery | Admitting: Physical Therapy

## 2020-03-30 DIAGNOSIS — R2681 Unsteadiness on feet: Secondary | ICD-10-CM | POA: Insufficient documentation

## 2020-03-30 DIAGNOSIS — R278 Other lack of coordination: Secondary | ICD-10-CM | POA: Diagnosis present

## 2020-03-30 DIAGNOSIS — M62838 Other muscle spasm: Secondary | ICD-10-CM | POA: Diagnosis not present

## 2020-03-30 DIAGNOSIS — M5412 Radiculopathy, cervical region: Secondary | ICD-10-CM | POA: Diagnosis not present

## 2020-03-30 DIAGNOSIS — M6281 Muscle weakness (generalized): Secondary | ICD-10-CM | POA: Diagnosis not present

## 2020-03-30 DIAGNOSIS — M545 Low back pain, unspecified: Secondary | ICD-10-CM

## 2020-03-30 NOTE — Therapy (Signed)
[place], [place], [address] Phone: [phone]   Fax:  [phone]  Physical Therapy Treatment  Patient Details  Name: Kelly Wall MRN: [id number] Date of Birth: 1960-12-01 Referring Provider (PT): Ashok Pall, MD   Encounter Date: 03/30/2020  PT End of Session - 03/30/20 1021    Visit Number  7    Number of Visits  21    Date for PT Re-Evaluation  04/01/20    PT Start Time  1018    PT Stop Time  1101    PT Time Calculation (min)  43 min    Activity Tolerance  Patient tolerated treatment well    Behavior During Therapy  [facility] for tasks assessed/performed       Past Medical History:  Diagnosis Date  . Asthma    child- occ exersie induced now  . Family history of adverse reaction to anesthesia    dad hard to awaken  . Hypertension    no rx  in 5 yrs fish oil helps now  . Paralysis [facility])     Past Surgical History:  Procedure Laterality Date  . ANTERIOR CERVICAL DECOMP/DISCECTOMY FUSION N/A 03/30/2018   Procedure: ANTERIOR CERVICAL DECOMPRESSION/DISCECTOMY FUSION Cervical four-five and Cervical five-six;  Surgeon: Ashok Pall, MD;  Location: [place];  Service: Neurosurgery;  Laterality: N/A;  . BACK SURGERY    . CERVICAL DISC ARTHROPLASTY N/A 04/20/2016   Procedure: Cervical Six-Seven Artificial Disc Replacement-Cervical;  Surgeon: Eustace Moore, MD;  Location: [place] [facility];  Service: Neurosurgery;  Laterality: N/A;    There were no vitals filed for this visit.  Subjective Assessment - 03/30/20 1022    Subjective  "I felt pretty good after the last session. On Saturday I walked with my Sister and she walked really fast and I felt like I was pretty sore."    Currently in Pain?  Yes    Pain Score  5                        [facility] Adult PT Treatment/Exercise - 03/30/20 0001      Lumbar Exercises: Stretches   Lower Trunk Rotation Limitations  2 x 15 avoiding extreme end ranges     Other Lumbar Stretch Exercise  seated low back stretch walking hands out on green physioball      Lumbar Exercises: Supine   Other Supine Lumbar Exercises  rolling to the L 1 x 10, and to the R with cues for proper form with extending ipsilateral side and reaching with contralateral arm      Knee/Hip Exercises: Seated   Sit to Sand  10 reps;1 set   rocking back and forth 3 x ea rep     Knee/Hip Exercises: Supine   Straight Leg Raises  1 set;15 reps;Right;Strengthening    Knee Flexion  2 sets;10 reps;Strengthening;Right   RLE only   Other Supine Knee/Hip Exercises  supine marching 2 x 10      Manual Therapy   Manual Therapy  Joint mobilization    Manual therapy comments  MTPR along the mid to proximal rectus femoris    Joint Mobilization  grade II RLE LAD with gentl oscillations     Soft tissue mobilization  IASTM along bil proxmial hip flexors                PT Short Term Goals - 03/23/20 1129      PT SHORT  TERM GOAL #1   Title  Patient will be able to demonstrate proper posture w/o being cued.    Period  Weeks    Status  On-going      PT SHORT TERM GOAL #2   Title  Patient will report that she was able to find comfortable stretching positions that do not put a lot of stress on her back    Period  Weeks    Status  On-going      PT SHORT TERM GOAL #3   Title  Patient will be able to increase her 6 min walk test distance by 50 ft    Period  Weeks    Status  Unable to assess        PT Long Term Goals - 03/19/20 1252      PT LONG TERM GOAL #1   Title  Patient will report that she rarely drops objects due to muscle weakness in order to decrease the amount of bending and lifting that she has to perform    Baseline  Pt. reports that muscle weakness causes her to drop objects    Time  10    Period  Weeks    Status  New    Target Date  05/06/20      PT LONG TERM GOAL #2   Title  Patient will comfortably achieve 60 degrees of cervical rotation in order to  decrease the amount of upper body twisting when driving.    Baseline  Rt. 60/Lt. 56    Time  10    Period  Weeks    Status  New    Target Date  05/06/20      PT LONG TERM GOAL #3   Title  Patient will be able to score >/= a 50 on her BERG balance test in order to reduce unsteadiness and antalgic gait when walking    Baseline  43 BERG score - 03/19/2020    Time  7    Period  Weeks    Status  New    Target Date  05/06/20      PT LONG TERM GOAL #4   Title  Patient will be able to safely bend over and pick up object from the floor with no assistance so that she can perform household activities and chores.    Baseline  Patient his unable to pick up objects from the floor without assistance.    Time  7    Period  Weeks    Target Date  05/06/20      PT LONG TERM GOAL #5   Title  Patient will report a max 5/10 LBP so that she is able to ride her tricycle with less difficulty    Time  7    Period  Weeks    Status  New    Target Date  05/06/20            Plan - 03/30/20 1241    Clinical Impression Statement  Kelly Wall continues to report stiffness in the hips and low back. practiced working STW along the hip flexors during stretching. Addressed looking rolling from side to side which she required tactile cues for proper form which she improved the speed/ ease of rolling over and going from suping <>sit. She did well with strengthening with intermittent cues for proper form.    PT Treatment/Interventions  ADLs/Self Care Home Management;Cryotherapy;Electrical Stimulation;Iontophoresis 4mg /ml Dexamethasone;Moist Heat;Traction;Ultrasound;Stair training;Gait training;Functional mobility training;Therapeutic exercise;Therapeutic activities;Balance training;Neuromuscular re-education;Patient/family education;Manual techniques;Scar  mobilization;Passive range of motion;Dry needling;Energy conservation;Taping;Visual/perceptual remediation/compensation;Joint Manipulations    PT Next Visit Plan  ERO next  session. Review STGs, and cervical ROM/ strength, Balance, gait, UE strengthening, update HEP for hamstring activation.    PT Home Exercise Plan  Cervical Isometric holds, Upper trap and levator stretches, Scapular retraction, Standing Hip abd. and ext [id number]    Consulted and Agree with Plan of Care  Patient       Patient will benefit from skilled therapeutic intervention in order to improve the following deficits and impairments:  Decreased activity tolerance, Decreased coordination, Decreased endurance, Decreased range of motion, Impaired flexibility, Improper body mechanics, Postural dysfunction, Pain  Visit Diagnosis: Muscle weakness (generalized)  Unsteadiness on feet  Acute right-sided low back pain without sciatica  Radiculopathy, cervical region  Other muscle spasm  Other lack of coordination     Problem List Patient Active Problem List   Diagnosis Date Noted  . Spondylolisthesis of lumbar region 01/30/2020  . Lumbar radiculopathy 10/15/2019  . Spondylosis of lumbar spine 08/06/2019  . Lumbar facet arthropathy 02/05/2019  . Cervical myelopathy (HCC) 04/02/2018  . Bilateral arm weakness   . Numbness and tingling in both hands   . Paresthesia and pain of both upper extremities   . Trauma   . Post-operative pain   . Uncomplicated asthma   . Benign essential HTN   . Bradycardia   . Steroid-induced hyperglycemia   . 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 of cervical spinal cord (HCC) 03/30/2018  . S/P cervical spinal fusion 04/20/2016  . Cervical radicular pain 03/22/2016  . Hamstring tightness 12/11/2014  . 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 Pain in joint, upper arm 11/17/2014  . Labyrinthitis 05/15/2012  . Sinusitis acute 02/12/2012  . Well adult exam 06/27/2011  . Rash, skin 06/27/2011  . ADHESIVE CAPSULITIS, LEFT 11/09/2009  . SHOULDER PAIN 09/09/2009  . HAIR LOSS 07/24/2008  . ELEVATED BP 07/24/2008  . CARPAL TUNNEL SYNDROME 03/23/2008  . PARESTHESIA 03/23/2008   . MIGRAINE HEADACHE 08/02/2007   Lulu Riding PT, DPT, LAT, ATC  03/30/20  12:48 PM      [facility] [address] [place], [place], [address] Phone: [phone]   Fax:  [phone]  Name: Kelly Wall MRN: [id number] Date of Birth: 1961/07/17

## 2020-04-02 ENCOUNTER — Ambulatory Visit: Payer: 59 | Admitting: Physical Therapy

## 2020-04-02 ENCOUNTER — Other Ambulatory Visit: Payer: Self-pay | Admitting: Physical Medicine & Rehabilitation

## 2020-04-02 ENCOUNTER — Encounter: Payer: Self-pay | Admitting: Physical Therapy

## 2020-04-02 ENCOUNTER — Other Ambulatory Visit: Payer: Self-pay

## 2020-04-02 DIAGNOSIS — R2681 Unsteadiness on feet: Secondary | ICD-10-CM | POA: Diagnosis not present

## 2020-04-02 DIAGNOSIS — M5412 Radiculopathy, cervical region: Secondary | ICD-10-CM

## 2020-04-02 DIAGNOSIS — R278 Other lack of coordination: Secondary | ICD-10-CM | POA: Diagnosis not present

## 2020-04-02 DIAGNOSIS — M62838 Other muscle spasm: Secondary | ICD-10-CM | POA: Diagnosis not present

## 2020-04-02 DIAGNOSIS — M545 Low back pain, unspecified: Secondary | ICD-10-CM

## 2020-04-02 DIAGNOSIS — M6281 Muscle weakness (generalized): Secondary | ICD-10-CM | POA: Diagnosis not present

## 2020-04-02 MED FILL — CLINDAMYCIN PHOS-BENZOYL PE: 1-5 | 30 days supply | Qty: 50 | Fill #2

## 2020-04-02 NOTE — Therapy (Addendum)
[place], [place], [address] Phone: [phone]   Fax:  [phone]  Physical Therapy Treatment  Patient Details  Name: Kelly Wall MRN: [id number] Date of Birth: May 02, 1961 Referring Provider (PT): Ashok Pall, MD   Encounter Date: 04/02/2020  PT End of Session - 04/02/20 1051    Visit Number  8    Number of Visits  21    Date for PT Re-Evaluation  05/28/20    PT Start Time  1016    PT Stop Time  1059    PT Time Calculation (min)  43 min    Equipment Utilized During Treatment  Gait belt    Activity Tolerance  Patient tolerated treatment well    Behavior During Therapy  [facility] for tasks assessed/performed       Past Medical History:  Diagnosis Date  . Asthma    child- occ exersie induced now  . Family history of adverse reaction to anesthesia    dad hard to awaken  . Hypertension    no rx  in 5 yrs fish oil helps now  . Paralysis [facility])     Past Surgical History:  Procedure Laterality Date  . ANTERIOR CERVICAL DECOMP/DISCECTOMY FUSION N/A 03/30/2018   Procedure: ANTERIOR CERVICAL DECOMPRESSION/DISCECTOMY FUSION Cervical four-five and Cervical five-six;  Surgeon: Ashok Pall, MD;  Location: [facility];  Service: Neurosurgery;  Laterality: N/A;  . BACK SURGERY    . CERVICAL DISC ARTHROPLASTY N/A 04/20/2016   Procedure: Cervical Six-Seven Artificial Disc Replacement-Cervical;  Surgeon: Eustace Moore, MD;  Location: [place] [facility];  Service: Neurosurgery;  Laterality: N/A;    There were no vitals filed for this visit.  Subjective Assessment - 04/02/20 1025    Subjective  "I have pain in my arms and neck. My back is doing well today."    Pertinent History  Patient broke her neck 2 years ago, lumbar fusion    Limitations  Lifting    Patient Stated Goals  Get stronger, and be safer with twisting    Currently in Pain?  Yes    Pain Score  5     Pain Location  Arm    Pain Orientation  Medial    Pain  Descriptors / Indicators  Numbness;Tightness;Sharp;Constant;Aching    Pain Type  Chronic pain    Pain Radiating Towards  down both arms down to the elbows    Pain Onset  More than a month ago    Pain Frequency  Constant    Aggravating Factors   Reaching, lifting    Pain Relieving Factors  medicine    Effect of Pain on Daily Activities  Driving, lifting    Pain Score  5    Pain Location  Hip    Pain Orientation  Posterior    Pain Score  5    Pain Location  Back    Pain Orientation  Lower         [facility] PT Assessment - 04/02/20 0001      Assessment   Medical Diagnosis  Radiculopathy, cervical region     Referring Provider (PT)  Ashok Pall, MD    Onset Date/Surgical Date  03/29/18    Hand Dominance  Right    Prior Therapy  Yes      AROM   Cervical Flexion  36    Cervical Extension  30    Cervical - Right Rotation  60    Cervical - Left Rotation  50      Strength   Right Shoulder Flexion  4-/5    Right Shoulder ABduction  4+/5    Right Shoulder Internal Rotation  5/5    Right Shoulder External Rotation  4/5    Left Shoulder Flexion  4-/5    Left Shoulder ABduction  4+/5    Left Shoulder Internal Rotation  5/5    Left Shoulder External Rotation  4/5                   [facility] Adult PT Treatment/Exercise - 04/02/20 0001      Neck Exercises: Machines for Strengthening   Nustep  L7 x 5 mins       Lumbar Exercises: Standing   Functional Squats  20 reps    Functional Squats Limitations  In Parallel bars       Knee/Hip Exercises: Standing   Hip Abduction  Stengthening;Both;2 sets;10 reps    Gait Training  Stepping over hurdles in parallel bars    5 hurdles, 2 times down and back    Other Standing Knee Exercises  Quad contraction w/ leg lift    Using box, 1 set of 10, Rt. and Lt.               PT Short Term Goals - 04/02/20 1057      PT SHORT TERM GOAL #1   Title  Patient will be able to demonstrate proper posture w/o being cued.    Period   Weeks    Status  Achieved      PT SHORT TERM GOAL #2   Title  Patient will report that she was able to find comfortable stretching positions that do not put a lot of stress on her back    Period  Weeks    Status  Achieved      PT SHORT TERM GOAL #3   Title  Patient will be able to increase her 6 min walk test distance by 50 ft    Baseline  589 ft - 03/19/2020    Period  Weeks    Status  Unable to assess        PT Long Term Goals - 03/19/20 1252      PT LONG TERM GOAL #1   Title  Patient will report that she rarely drops objects due to muscle weakness in order to decrease the amount of bending and lifting that she has to perform    Baseline  Pt. reports that muscle weakness causes her to drop objects    Time  10    Period  Weeks    Status  New    Target Date  05/06/20      PT LONG TERM GOAL #2   Title  Patient will comfortably achieve 60 degrees of cervical rotation in order to decrease the amount of upper body twisting when driving.    Baseline  Rt. 60/Lt. 56    Time  10    Period  Weeks    Status  New    Target Date  05/06/20      PT LONG TERM GOAL #3   Title  Patient will be able to score >/= a 50 on her BERG balance test in order to reduce unsteadiness and antalgic gait when walking    Baseline  43 BERG score - 03/19/2020    Time  7    Period  Weeks    Status  New    Target  Date  05/06/20      PT LONG TERM GOAL #4   Title  Patient will be able to safely bend over and pick up object from the floor with no assistance so that she can perform household activities and chores.    Baseline  Patient his unable to pick up objects from the floor without assistance.    Time  7    Period  Weeks    Target Date  05/06/20      PT LONG TERM GOAL #5   Title  Patient will report a max 5/10 LBP so that she is able to ride her tricycle with less difficulty    Time  7    Period  Weeks    Status  New    Target Date  05/06/20            Plan - 04/02/20 1317    Clinical  Impression Statement  Patient reports that she has been experiencing an increase in arm and cervical pain. She was able to tolerate all of the exercises provided. Today's session focused on gait training and LE strengthening. She had some difficulty with Rt. quad activation. She did well inside the parallel bars. We continued to discuss modifying exercises. She was able to achieve most of her STG.    Personal Factors and Comorbidities  Comorbidity 3+    Comorbidities  HTN, lumbar fusion, paralysis    Examination-Activity Limitations  Carry;Lift;Reach Overhead    Examination-Participation Restrictions  Other;Driving    Stability/Clinical Decision Making  Unstable/Unpredictable    Clinical Decision Making  High    Rehab Potential  Fair    PT Frequency  2x / week    PT Treatment/Interventions  ADLs/Self Care Home Management;Cryotherapy;Electrical Stimulation;Iontophoresis 4mg /ml Dexamethasone;Moist Heat;Traction;Ultrasound;Stair training;Gait training;Functional mobility training;Therapeutic exercise;Therapeutic activities;Balance training;Neuromuscular re-education;Patient/family education;Manual techniques;Scar mobilization;Passive range of motion;Dry needling;Energy conservation;Taping;Visual/perceptual remediation/compensation;Joint Manipulations    PT Next Visit Plan  6 MWT, UE/LE strengthening, quad activation    PT Home Exercise Plan  Cervical Isometric holds, Upper trap and levator stretches, Scapular retraction, Standing Hip abd. and ext [id number]    Consulted and Agree with Plan of Care  Patient       Patient will benefit from skilled therapeutic intervention in order to improve the following deficits and impairments:  Decreased activity tolerance, Decreased coordination, Decreased endurance, Decreased range of motion, Impaired flexibility, Improper body mechanics, Postural dysfunction, Pain  Visit Diagnosis: Muscle weakness (generalized)  Unsteadiness on feet  Other muscle  spasm  Acute right-sided low back pain without sciatica  Radiculopathy, cervical region  Other lack of coordination     Problem List Patient Active Problem List   Diagnosis Date Noted  . Spondylolisthesis of lumbar region 01/30/2020  . Lumbar radiculopathy 10/15/2019  . Spondylosis of lumbar spine 08/06/2019  . Lumbar facet arthropathy 02/05/2019  . Cervical myelopathy (HCC) 04/02/2018  . Bilateral arm weakness   . Numbness and tingling in both hands   . Paresthesia and pain of both upper extremities   . Trauma   . Post-operative pain   . Uncomplicated asthma   . Benign essential HTN   . Bradycardia   . Steroid-induced hyperglycemia   . 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 of cervical spinal cord (HCC) 03/30/2018  . S/P cervical spinal fusion 04/20/2016  . Cervical radicular pain 03/22/2016  . Hamstring tightness 12/11/2014  . 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 Pain in joint, upper arm 11/17/2014  . Labyrinthitis 05/15/2012  . Sinusitis acute 02/12/2012  .  Well adult exam 06/27/2011  . Rash, skin 06/27/2011  . ADHESIVE CAPSULITIS, LEFT 11/09/2009  . SHOULDER PAIN 09/09/2009  . HAIR LOSS 07/24/2008  . ELEVATED BP 07/24/2008  . CARPAL TUNNEL SYNDROME 03/23/2008  . PARESTHESIA 03/23/2008  . MIGRAINE HEADACHE 08/02/2007    Cato Mulligan, SPT 04/02/2020, 1:42 PM  [facility] [address] [place], [place], [address] Phone: [phone]   Fax:  [phone]  Name: Kelly Wall MRN: [id number] Date of Birth: October 01, 1961

## 2020-04-06 ENCOUNTER — Other Ambulatory Visit: Payer: Self-pay

## 2020-04-06 ENCOUNTER — Ambulatory Visit: Payer: 59 | Admitting: Physical Therapy

## 2020-04-06 ENCOUNTER — Encounter: Payer: Self-pay | Admitting: Physical Therapy

## 2020-04-06 DIAGNOSIS — M545 Low back pain, unspecified: Secondary | ICD-10-CM

## 2020-04-06 DIAGNOSIS — R2681 Unsteadiness on feet: Secondary | ICD-10-CM

## 2020-04-06 DIAGNOSIS — M62838 Other muscle spasm: Secondary | ICD-10-CM | POA: Diagnosis not present

## 2020-04-06 DIAGNOSIS — M6281 Muscle weakness (generalized): Secondary | ICD-10-CM

## 2020-04-06 DIAGNOSIS — R278 Other lack of coordination: Secondary | ICD-10-CM | POA: Diagnosis not present

## 2020-04-06 DIAGNOSIS — M5412 Radiculopathy, cervical region: Secondary | ICD-10-CM | POA: Diagnosis not present

## 2020-04-06 NOTE — Therapy (Signed)
[place], [place], [address] Phone: [phone]   Fax:  [phone]  Physical Therapy Treatment  Patient Details  Name: Kelly Wall MRN: [id number] Date of Birth: 1961-03-12 Referring Provider (PT): Ashok Pall, MD   Encounter Date: 04/06/2020  PT End of Session - 04/06/20 1018    Visit Number  9    Number of Visits  21    Date for PT Re-Evaluation  05/28/20    PT Start Time  1017    PT Stop Time  1058    PT Time Calculation (min)  41 min    Activity Tolerance  Patient tolerated treatment well    Behavior During Therapy  [facility] for tasks assessed/performed       Past Medical History:  Diagnosis Date  . Asthma    child- occ exersie induced now  . Family history of adverse reaction to anesthesia    dad hard to awaken  . Hypertension    no rx  in 5 yrs fish oil helps now  . Paralysis [facility])     Past Surgical History:  Procedure Laterality Date  . ANTERIOR CERVICAL DECOMP/DISCECTOMY FUSION N/A 03/30/2018   Procedure: ANTERIOR CERVICAL DECOMPRESSION/DISCECTOMY FUSION Cervical four-five and Cervical five-six;  Surgeon: Ashok Pall, MD;  Location: [place];  Service: Neurosurgery;  Laterality: N/A;  . BACK SURGERY    . CERVICAL DISC ARTHROPLASTY N/A 04/20/2016   Procedure: Cervical Six-Seven Artificial Disc Replacement-Cervical;  Surgeon: Eustace Moore, MD;  Location: [place] [facility];  Service: Neurosurgery;  Laterality: N/A;    There were no vitals filed for this visit.  Subjective Assessment - 04/06/20 1022    Subjective  " I am feeling pretty stiff today with my R foot/ leg catching"    Patient Stated Goals  Get stronger, and be safer with twisting    Currently in Pain?  Yes    Pain Score  4     Pain Location  Arm    Pain Orientation  Medial    Pain Location  Hip    Pain Orientation  Posterior;Right         [facility] PT Assessment - 04/06/20 0001      Assessment   Medical Diagnosis   Radiculopathy, cervical region     Referring Provider (PT)  Ashok Pall, MD      6 minute walk test results    Aerobic Endurance Distance Walked  [place] Adult PT Treatment/Exercise - 04/06/20 0001      Lumbar Exercises: Aerobic   Recumbent Bike  L3 x 5 min       Lumbar Exercises: Supine   Bent Knee Raise  10 reps   alternating L/R     Knee/Hip Exercises: Seated   Hamstring Curl  2 sets;15 reps   with red theraband   Sit to Sand  10 reps;1 set      Knee/Hip Exercises: Supine   Hip Adduction Isometric  Both;1 set;15 reps   holding 2-3 seconds   Bridges with Clamshell  15 reps;2 sets;Both;Strengthening    Straight Leg Raises  Right;Strengthening;2 sets;10 reps      Manual Therapy   Joint Mobilization  grade II RLE LAD with gentl oscillations                PT Short Term Goals - 04/02/20 1057  PT SHORT TERM GOAL #1   Title  Patient will be able to demonstrate proper posture w/o being cued.    Period  Weeks    Status  Achieved      PT SHORT TERM GOAL #2   Title  Patient will report that she was able to find comfortable stretching positions that do not put a lot of stress on her back    Period  Weeks    Status  Achieved      PT SHORT TERM GOAL #3   Title  Patient will be able to increase her 6 min walk test distance by 50 ft    Baseline  589 ft - 03/19/2020    Period  Weeks    Status  Unable to assess        PT Long Term Goals - 03/19/20 1252      PT LONG TERM GOAL #1   Title  Patient will report that she rarely drops objects due to muscle weakness in order to decrease the amount of bending and lifting that she has to perform    Baseline  Pt. reports that muscle weakness causes her to drop objects    Time  10    Period  Weeks    Status  New    Target Date  05/06/20      PT LONG TERM GOAL #2   Title  Patient will comfortably achieve 60 degrees of cervical rotation in order to decrease the amount of upper body  twisting when driving.    Baseline  Rt. 60/Lt. 56    Time  10    Period  Weeks    Status  New    Target Date  05/06/20      PT LONG TERM GOAL #3   Title  Patient will be able to score >/= a 50 on her BERG balance test in order to reduce unsteadiness and antalgic gait when walking    Baseline  43 BERG score - 03/19/2020    Time  7    Period  Weeks    Status  New    Target Date  05/06/20      PT LONG TERM GOAL #4   Title  Patient will be able to safely bend over and pick up object from the floor with no assistance so that she can perform household activities and chores.    Baseline  Patient his unable to pick up objects from the floor without assistance.    Time  7    Period  Weeks    Target Date  05/06/20      PT LONG TERM GOAL #5   Title  Patient will report a max 5/10 LBP so that she is able to ride her tricycle with less difficulty    Time  7    Period  Weeks    Status  New    Target Date  05/06/20            Plan - 04/06/20 1115    Clinical Impression Statement  pt reports continued stiffnes pain and stiffness in the hips and in the low back. She improved her 6 min walk by 67 ft today. continued working on hip / knee strengthening with incrased reps to promote endurance. She reported decreased stiffness in the low back end of session.    PT Treatment/Interventions  ADLs/Self Care Home Management;Cryotherapy;Electrical Stimulation;Iontophoresis 4mg /ml Dexamethasone;Moist Heat;Traction;Ultrasound;Stair training;Gait training;Functional mobility training;Therapeutic exercise;Therapeutic activities;Balance training;Neuromuscular re-education;Patient/family education;Manual techniques;Scar  mobilization;Passive range of motion;Dry needling;Energy conservation;Taping;Visual/perceptual remediation/compensation;Joint Manipulations    PT Next Visit Plan  , UE/LE strengthening, quad activation, hamstring activation, core strengthening, balance training,    PT Home Exercise Plan   Cervical Isometric holds, Upper trap and levator stretches, Scapular retraction, Standing Hip abd. and ext [id number]       Patient will benefit from skilled therapeutic intervention in order to improve the following deficits and impairments:     Visit Diagnosis: Muscle weakness (generalized)  Unsteadiness on feet  Other muscle spasm  Acute right-sided low back pain without sciatica     Problem List Patient Active Problem List   Diagnosis Date Noted  . Spondylolisthesis of lumbar region 01/30/2020  . Lumbar radiculopathy 10/15/2019  . Spondylosis of lumbar spine 08/06/2019  . Lumbar facet arthropathy 02/05/2019  . Cervical myelopathy (HCC) 04/02/2018  . Bilateral arm weakness   . Numbness and tingling in both hands   . Paresthesia and pain of both upper extremities   . Trauma   . Post-operative pain   . Uncomplicated asthma   . Benign essential HTN   . Bradycardia   . Steroid-induced hyperglycemia   . 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 of cervical spinal cord (HCC) 03/30/2018  . S/P cervical spinal fusion 04/20/2016  . Cervical radicular pain 03/22/2016  . Hamstring tightness 12/11/2014  . 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 Pain in joint, upper arm 11/17/2014  . Labyrinthitis 05/15/2012  . Sinusitis acute 02/12/2012  . Well adult exam 06/27/2011  . Rash, skin 06/27/2011  . ADHESIVE CAPSULITIS, LEFT 11/09/2009  . SHOULDER PAIN 09/09/2009  . HAIR LOSS 07/24/2008  . ELEVATED BP 07/24/2008  . CARPAL TUNNEL SYNDROME 03/23/2008  . PARESTHESIA 03/23/2008  . MIGRAINE HEADACHE 08/02/2007   Lulu Riding PT, DPT, LAT, ATC  04/06/20  11:25 AM      [facility] [address] [place], [place], [address] Phone: [phone]   Fax:  [phone]  Name: Kelly Wall MRN: [id number] Date of Birth: 08/06/1961

## 2020-04-09 ENCOUNTER — Encounter: Payer: Self-pay | Admitting: Physical Therapy

## 2020-04-09 ENCOUNTER — Other Ambulatory Visit: Payer: Self-pay

## 2020-04-09 ENCOUNTER — Ambulatory Visit: Payer: 59 | Admitting: Physical Therapy

## 2020-04-09 DIAGNOSIS — M545 Low back pain, unspecified: Secondary | ICD-10-CM

## 2020-04-09 DIAGNOSIS — R2681 Unsteadiness on feet: Secondary | ICD-10-CM

## 2020-04-09 DIAGNOSIS — M62838 Other muscle spasm: Secondary | ICD-10-CM | POA: Diagnosis not present

## 2020-04-09 DIAGNOSIS — M6281 Muscle weakness (generalized): Secondary | ICD-10-CM

## 2020-04-09 DIAGNOSIS — M5412 Radiculopathy, cervical region: Secondary | ICD-10-CM | POA: Diagnosis not present

## 2020-04-09 DIAGNOSIS — R278 Other lack of coordination: Secondary | ICD-10-CM | POA: Diagnosis not present

## 2020-04-09 NOTE — Therapy (Signed)
[facility] Outpatient Rehabilitation [facility] [address] [place], [place], [address] Phone: [phone]   Fax:  [phone]  Physical Therapy Treatment  Patient Details  Name: Kelly Wall MRN: [id number] Date of Birth: 02/21/1961 Referring Provider (PT): Coletta Memos, MD   Encounter Date: 04/09/2020  PT End of Session - 04/09/20 1020    Visit Number  10    Number of Visits  21    Date for PT Re-Evaluation  05/28/20    PT Start Time  1020   pt arrived 20 min late   PT Stop Time  1045    PT Time Calculation (min)  25 min    Activity Tolerance  Patient tolerated treatment well    Behavior During Therapy  [facility] for tasks assessed/performed       Past Medical History:  Diagnosis Date  . Asthma    child- occ exersie induced now  . Family history of adverse reaction to anesthesia    dad hard to awaken  . Hypertension    no rx  in 5 yrs fish oil helps now  . Paralysis [facility])     Past Surgical History:  Procedure Laterality Date  . ANTERIOR CERVICAL DECOMP/DISCECTOMY FUSION N/A 03/30/2018   Procedure: ANTERIOR CERVICAL DECOMPRESSION/DISCECTOMY FUSION Cervical four-five and Cervical five-six;  Surgeon: Coletta Memos, MD;  Location: [facility] OR;  Service: Neurosurgery;  Laterality: N/A;  . BACK SURGERY    . CERVICAL DISC ARTHROPLASTY N/A 04/20/2016   Procedure: Cervical Six-Seven Artificial Disc Replacement-Cervical;  Surgeon: Tia Alert, MD;  Location: [facility];  Service: Neurosurgery;  Laterality: N/A;    There were no vitals filed for this visit.  Subjective Assessment - 04/09/20 1020    Subjective  "I am doing about the same today"    Currently in Pain?  Yes    Pain Score  4     Pain Orientation  Medial    Multiple Pain Sites  Yes    Pain Score  5    Pain Location  Hip    Pain Score  5    Pain Location  Back    Pain Orientation  Lower         [facility] PT Assessment - 04/09/20 0001      Assessment   Medical Diagnosis  Radiculopathy,  cervical region     Referring Provider (PT)  Coletta Memos, MD                    [facility] Adult PT Treatment/Exercise - 04/09/20 0001      Knee/Hip Exercises: Standing   Hip Abduction  Stengthening;2 sets;20 reps;Knee straight   with bil HHA from table   Abduction Limitations  with SI Belt    Forward Step Up  2 sets;Both;10 reps;Step Height: 6"    Gait Training  stepping  over 5 hurdles leading with RLE x 4 cues to avoid leaing back to assist with lifting the RLE    Other Standing Knee Exercises  alternating L/R taping on 6 foot step 2 x 20   cues for control and avoid stomping     Knee/Hip Exercises: Seated   Sit to Sand  2 sets;10 reps;without UE support   cues to avoid pushing back of the leg to the table            PT Education - 04/09/20 1149    Education Details  dicussed if after riding her tricycle she has to go through [employer]  modalities and activities to relieve stiffness/ pain then she is doing too much.    Person(s) Educated  Patient    Methods  Explanation;Verbal cues    Comprehension  Verbalized understanding;Verbal cues required       PT Short Term Goals - 04/02/20 1057      PT SHORT TERM GOAL #1   Title  Patient will be able to demonstrate proper posture w/o being cued.    Period  Weeks    Status  Achieved      PT SHORT TERM GOAL #2   Title  Patient will report that she was able to find comfortable stretching positions that do not put a lot of stress on her back    Period  Weeks    Status  Achieved      PT SHORT TERM GOAL #3   Title  Patient will be able to increase her 6 min walk test distance by 50 ft    Baseline  589 ft - 03/19/2020    Period  Weeks    Status  Unable to assess        PT Long Term Goals - 03/19/20 1252      PT LONG TERM GOAL #1   Title  Patient will report that she rarely drops objects due to muscle weakness in order to decrease the amount of bending and lifting that she has to perform    Baseline  Pt. reports  that muscle weakness causes her to drop objects    Time  10    Period  Weeks    Status  New    Target Date  05/06/20      PT LONG TERM GOAL #2   Title  Patient will comfortably achieve 60 degrees of cervical rotation in order to decrease the amount of upper body twisting when driving.    Baseline  Rt. 60/Lt. 56    Time  10    Period  Weeks    Status  New    Target Date  05/06/20      PT LONG TERM GOAL #3   Title  Patient will be able to score >/= a 50 on her BERG balance test in order to reduce unsteadiness and antalgic gait when walking    Baseline  43 BERG score - 03/19/2020    Time  7    Period  Weeks    Status  New    Target Date  05/06/20      PT LONG TERM GOAL #4   Title  Patient will be able to safely bend over and pick up object from the floor with no assistance so that she can perform household activities and chores.    Baseline  Patient his unable to pick up objects from the floor without assistance.    Time  7    Period  Weeks    Target Date  05/06/20      PT LONG TERM GOAL #5   Title  Patient will report a max 5/10 LBP so that she is able to ride her tricycle with less difficulty    Time  7    Period  Weeks    Status  New    Target Date  05/06/20            Plan - 04/09/20 1152    Clinical Impression Statement  limited session due to pt arrived 20 min late today. Focused on hip/ strengthening and balance training with alternating  toe taps. Trialed session today wearing an SI belt which she did report relief of pain / tension but reported the pain pinpoted more to the middle versus the enitre back.    PT Treatment/Interventions  ADLs/Self Care Home Management;Cryotherapy;Electrical Stimulation;Iontophoresis 4mg /ml Dexamethasone;Moist Heat;Traction;Ultrasound;Stair training;Gait training;Functional mobility training;Therapeutic exercise;Therapeutic activities;Balance training;Neuromuscular re-education;Patient/family education;Manual techniques;Scar  mobilization;Passive range of motion;Dry needling;Energy conservation;Taping;Visual/perceptual remediation/compensation;Joint Manipulations    PT Next Visit Plan  , UE/LE strengthening, quad activation, hamstring activation, core strengthening, balance training,    PT Home Exercise Plan  Cervical Isometric holds, Upper trap and levator stretches, Scapular retraction, Standing Hip abd. and ext [id number]    Consulted and Agree with Plan of Care  Patient       Patient will benefit from skilled therapeutic intervention in order to improve the following deficits and impairments:  Decreased activity tolerance, Decreased coordination, Decreased endurance, Decreased range of motion, Impaired flexibility, Improper body mechanics, Postural dysfunction, Pain  Visit Diagnosis: Muscle weakness (generalized)  Unsteadiness on feet  Other muscle spasm  Acute right-sided low back pain without sciatica     Problem List Patient Active Problem List   Diagnosis Date Noted  . Spondylolisthesis of lumbar region 01/30/2020  . Lumbar radiculopathy 10/15/2019  . Spondylosis of lumbar spine 08/06/2019  . Lumbar facet arthropathy 02/05/2019  . Cervical myelopathy (HCC) 04/02/2018  . Bilateral arm weakness   . Numbness and tingling in both hands   . Paresthesia and pain of both upper extremities   . Trauma   . Post-operative pain   . Uncomplicated asthma   . Benign essential HTN   . Bradycardia   . Steroid-induced hyperglycemia   . 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 of cervical spinal cord (HCC) 03/30/2018  . S/P cervical spinal fusion 04/20/2016  . Cervical radicular pain 03/22/2016  . Hamstring tightness 12/11/2014  . 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 Pain in joint, upper arm 11/17/2014  . Labyrinthitis 05/15/2012  . Sinusitis acute 02/12/2012  . Well adult exam 06/27/2011  . Rash, skin 06/27/2011  . ADHESIVE CAPSULITIS, LEFT 11/09/2009  . SHOULDER PAIN 09/09/2009  . HAIR LOSS 07/24/2008  . ELEVATED  BP 07/24/2008  . CARPAL TUNNEL SYNDROME 03/23/2008  . PARESTHESIA 03/23/2008  . MIGRAINE HEADACHE 08/02/2007   10/02/2007 PT, DPT, LAT, ATC  04/09/20  11:56 AM      [facility] Outpatient Rehabilitation [facility] [address] [place], [place], [place] Phone: [phone]   Fax:  [phone]  Name: Kelly Wall MRN: [id number] [id number] Date of Birth: 1961-09-19

## 2020-04-12 MED FILL — PREGABALIN 100 MG CAPS: 100 | 30 days supply | Qty: 60 | Fill #2

## 2020-04-13 ENCOUNTER — Other Ambulatory Visit: Payer: Self-pay

## 2020-04-13 ENCOUNTER — Ambulatory Visit: Payer: 59 | Admitting: Physical Therapy

## 2020-04-13 DIAGNOSIS — M545 Low back pain, unspecified: Secondary | ICD-10-CM

## 2020-04-13 DIAGNOSIS — M6281 Muscle weakness (generalized): Secondary | ICD-10-CM

## 2020-04-13 DIAGNOSIS — M5412 Radiculopathy, cervical region: Secondary | ICD-10-CM

## 2020-04-13 DIAGNOSIS — R278 Other lack of coordination: Secondary | ICD-10-CM | POA: Diagnosis not present

## 2020-04-13 DIAGNOSIS — M62838 Other muscle spasm: Secondary | ICD-10-CM

## 2020-04-13 DIAGNOSIS — R2681 Unsteadiness on feet: Secondary | ICD-10-CM | POA: Diagnosis not present

## 2020-04-13 NOTE — Therapy (Signed)
[facility] Outpatient Rehabilitation [facility] [address] [place], [place], [address] Phone: [phone]   Fax:  [phone]  Physical Therapy Treatment  Patient Details  Name: Kelly Wall MRN: [id number] Date of Birth: [birth year] Referring Provider (PT): Coletta Memos, MD   Encounter Date: 04/13/2020  PT End of Session - 04/13/20 1020    Visit Number  11    Number of Visits  21    Date for PT Re-Evaluation  05/28/20    PT Start Time  1020   pt arrived   PT Stop Time  1105    PT Time Calculation (min)  45 min    Activity Tolerance  Patient tolerated treatment well    Behavior During Therapy  [facility] for tasks assessed/performed       Past Medical History:  Diagnosis Date  . Asthma    child- occ exersie induced now  . Family history of adverse reaction to anesthesia    dad hard to awaken  . Hypertension    no rx  in 5 yrs fish oil helps now  . Paralysis [facility])     Past Surgical History:  Procedure Laterality Date  . ANTERIOR CERVICAL DECOMP/DISCECTOMY FUSION N/A 03/30/2018   Procedure: ANTERIOR CERVICAL DECOMPRESSION/DISCECTOMY FUSION Cervical four-five and Cervical five-six;  Surgeon: Coletta Memos, MD;  Location: [facility] OR;  Service: Neurosurgery;  Laterality: N/A;  . BACK SURGERY    . CERVICAL DISC ARTHROPLASTY N/A 04/20/2016   Procedure: Cervical Six-Seven Artificial Disc Replacement-Cervical;  Surgeon: Tia Alert, MD;  Location: [facility];  Service: Neurosurgery;  Laterality: N/A;    There were no vitals filed for this visit.  Subjective Assessment - 04/13/20 1024    Subjective  "I am doing still about the same, pretty stiff in the leg"    Currently in Pain?  Yes    Pain Score  5     Pain Orientation  Medial    Pain Descriptors / Indicators  Aching;Tightness;Sore    Pain Type  Chronic pain    Pain Onset  More than a month ago    Pain Frequency  Intermittent         [facility] PT Assessment - 04/13/20 0001      Assessment   Medical  Diagnosis  Radiculopathy, cervical region     Referring Provider (PT)  Coletta Memos, MD                    [facility] Adult PT Treatment/Exercise - 04/13/20 0001      Lumbar Exercises: Aerobic   Nustep  L7 x 6 min LE only      Knee/Hip Exercises: Stretches   Hip Flexor Stretch  2 reps;30 seconds;Both      Knee/Hip Exercises: Standing   Gait Training  walking with trekking poles to reduce posterior trunk lean in standing / walking to assist with hip flexion compensation   4 x 100 ft, intermittent cues for proper form/ technique     Knee/Hip Exercises: Seated   Knee/Hip Flexion  hip flexion RLE sliding heel up the L shin 1 x 10    Hamstring Curl  Strengthening;Right;2 sets;10 reps   with red theraband   Sit to Sand  1 set;10 reps      Manual Therapy   Manual therapy comments  MTPR along bil lumbar paraspinals    Soft tissue mobilization  DTM along bil lumbar paraspinals  PT Education - 04/13/20 1134    Education Details  gait biomechanics using posterior trunk lean to help compensate hip flexor weakness. benefits of hip flexor strengthening and avoiding leaning to assist with hip flexion. proper form of using trekking poles and their benefits.    Person(s) Educated  Patient    Methods  Explanation;Verbal cues;Handout    Comprehension  Verbalized understanding;Verbal cues required       PT Short Term Goals - 04/02/20 1057      PT SHORT TERM GOAL #1   Title  Patient will be able to demonstrate proper posture w/o being cued.    Period  Weeks    Status  Achieved      PT SHORT TERM GOAL #2   Title  Patient will report that she was able to find comfortable stretching positions that do not put a lot of stress on her back    Period  Weeks    Status  Achieved      PT SHORT TERM GOAL #3   Title  Patient will be able to increase her 6 min walk test distance by 50 ft    Baseline  589 ft - 03/19/2020    Period  Weeks    Status  Unable to assess         PT Long Term Goals - 03/19/20 1252      PT LONG TERM GOAL #1   Title  Patient will report that she rarely drops objects due to muscle weakness in order to decrease the amount of bending and lifting that she has to perform    Baseline  Pt. reports that muscle weakness causes her to drop objects    Time  10    Period  Weeks    Status  New    Target Date  05/06/20      PT LONG TERM GOAL #2   Title  Patient will comfortably achieve 60 degrees of cervical rotation in order to decrease the amount of upper body twisting when driving.    Baseline  Rt. 60/Lt. 56    Time  10    Period  Weeks    Status  New    Target Date  05/06/20      PT LONG TERM GOAL #3   Title  Patient will be able to score >/= a 50 on her BERG balance test in order to reduce unsteadiness and antalgic gait when walking    Baseline  43 BERG score - 03/19/2020    Time  7    Period  Weeks    Status  New    Target Date  05/06/20      PT LONG TERM GOAL #4   Title  Patient will be able to safely bend over and pick up object from the floor with no assistance so that she can perform household activities and chores.    Baseline  Patient his unable to pick up objects from the floor without assistance.    Time  7    Period  Weeks    Target Date  05/06/20      PT LONG TERM GOAL #5   Title  Patient will report a max 5/10 LBP so that she is able to ride her tricycle with less difficulty    Time  7    Period  Weeks    Status  New    Target Date  05/06/20  Plan - 04/13/20 1135    Clinical Impression Statement  pt continues to report consistent pain and stiffness in bil hip flexors and her lumbar paraspinals. Continued STW along both hip flexors and lumbar paraspinals followed with gross hip strengthening. practiced gait training using trekking poles to reduce posterior trunk lean with gait which she responded well to and demonstrated increased gait speed visually.    PT Next Visit Plan  , UE/LE  strengthening, gross hip strengthening,  hamstring activation, core strengthening, balance training, gait training with trekking poles    PT Home Exercise Plan  Cervical Isometric holds, Upper trap and levator stretches, Scapular retraction, Standing Hip abd. and ext [id number]       Patient will benefit from skilled therapeutic intervention in order to improve the following deficits and impairments:  Decreased activity tolerance, Decreased coordination, Decreased endurance, Decreased range of motion, Impaired flexibility, Improper body mechanics, Postural dysfunction, Pain  Visit Diagnosis: Muscle weakness (generalized)  Unsteadiness on feet  Other muscle spasm  Acute right-sided low back pain without sciatica  Radiculopathy, cervical region     Problem List Patient Active Problem List   Diagnosis Date Noted  . Spondylolisthesis of lumbar region 01/30/2020  . Lumbar radiculopathy 10/15/2019  . Spondylosis of lumbar spine 08/06/2019  . Lumbar facet arthropathy 02/05/2019  . Cervical myelopathy ([place]) 04/02/2018  . Bilateral arm weakness   . Numbness and tingling in both hands   . Paresthesia and pain of both upper extremities   . Trauma   . Post-operative pain   . Uncomplicated asthma   . Benign essential HTN   . Bradycardia   . Steroid-induced hyperglycemia   . 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 of cervical spinal cord ([place]) 03/30/2018  . S/P cervical spinal fusion 04/20/2016  . Cervical radicular pain 03/22/2016  . Hamstring tightness 12/11/2014  . 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 Pain in joint, upper arm 11/17/2014  . Labyrinthitis 05/15/2012  . Sinusitis acute 02/12/2012  . Well adult exam 06/27/2011  . Rash, skin 06/27/2011  . ADHESIVE CAPSULITIS, LEFT 11/09/2009  . SHOULDER PAIN 09/09/2009  . HAIR LOSS 07/24/2008  . ELEVATED BP 07/24/2008  . CARPAL TUNNEL SYNDROME 03/23/2008  . PARESTHESIA 03/23/2008  . MIGRAINE HEADACHE 08/02/2007    [name] PT, DPT,  LAT, ATC  04/13/20  11:39 AM      [facility] [address] [place], [place], [address] Phone: [phone]   Fax:  [phone]  Name: Kelly Wall MRN: [id number] Date of Birth: [birth year]

## 2020-04-16 ENCOUNTER — Encounter: Payer: Self-pay | Admitting: Physical Therapy

## 2020-04-16 ENCOUNTER — Other Ambulatory Visit: Payer: Self-pay

## 2020-04-16 ENCOUNTER — Ambulatory Visit: Payer: 59 | Admitting: Physical Therapy

## 2020-04-16 DIAGNOSIS — M62838 Other muscle spasm: Secondary | ICD-10-CM | POA: Diagnosis not present

## 2020-04-16 DIAGNOSIS — R2681 Unsteadiness on feet: Secondary | ICD-10-CM

## 2020-04-16 DIAGNOSIS — M6281 Muscle weakness (generalized): Secondary | ICD-10-CM | POA: Diagnosis not present

## 2020-04-16 DIAGNOSIS — R278 Other lack of coordination: Secondary | ICD-10-CM

## 2020-04-16 DIAGNOSIS — M545 Low back pain, unspecified: Secondary | ICD-10-CM

## 2020-04-16 DIAGNOSIS — M5412 Radiculopathy, cervical region: Secondary | ICD-10-CM

## 2020-04-16 NOTE — Therapy (Signed)
[facility] [place], [place], [address] Phone: [phone]   Fax:  [phone]  Physical Therapy Treatment  Patient Details  Name: Kelly Wall MRN: [id number] Date of Birth: 1961/11/03 Referring Provider (PT): Ashok Pall, MD   Encounter Date: 04/16/2020  PT End of Session - 04/16/20 1017    Visit Number  12    Number of Visits  21    Date for PT Re-Evaluation  05/28/20    PT Start Time  1017    PT Stop Time  1101    PT Time Calculation (min)  44 min    Activity Tolerance  Patient tolerated treatment well    Behavior During Therapy  [facility] for tasks assessed/performed       Past Medical History:  Diagnosis Date  . Asthma    child- occ exersie induced now  . Family history of adverse reaction to anesthesia    dad hard to awaken  . Hypertension    no rx  in 5 yrs fish oil helps now  . Paralysis [facility])     Past Surgical History:  Procedure Laterality Date  . ANTERIOR CERVICAL DECOMP/DISCECTOMY FUSION N/A 03/30/2018   Procedure: ANTERIOR CERVICAL DECOMPRESSION/DISCECTOMY FUSION Cervical four-five and Cervical five-six;  Surgeon: Ashok Pall, MD;  Location: [place];  Service: Neurosurgery;  Laterality: N/A;  . BACK SURGERY    . CERVICAL DISC ARTHROPLASTY N/A 04/20/2016   Procedure: Cervical Six-Seven Artificial Disc Replacement-Cervical;  Surgeon: Eustace Moore, MD;  Location: [place] NEUR[facility] ORS;  Service: Neurosurgery;  Laterality: N/A;    There were no vitals filed for this visit.      [facility] PT Assessment - 04/16/20 0001      Ambulation/Gait   Gait Comments  improved speed and posture when using trekking poles                    [facility] Adult PT Treatment/Exercise - 04/16/20 0001      Lumbar Exercises: Aerobic   Nustep  L7 5 min UE & LE      Knee/Hip Exercises: Stretches   Piriformis Stretch Limitations  seated EOB      Knee/Hip Exercises: Aerobic   Nustep  5 min end of session      Knee/Hip Exercises: Standing   Heel Raises Limitations  heel-toe raises    Functional Squat Limitations  hands on back of chair    Gait Training  trekking poles- cues for heel toe    Other Standing Knee Exercises  side lunges      Manual Therapy   Soft tissue mobilization  IASTM bil gluts & piriformis               PT Short Term Goals - 04/02/20 1057      PT SHORT TERM GOAL #1   Title  Patient will be able to demonstrate proper posture w/o being cued.    Period  Weeks    Status  Achieved      PT SHORT TERM GOAL #2   Title  Patient will report that she was able to find comfortable stretching positions that do not put a lot of stress on her back    Period  Weeks    Status  Achieved      PT SHORT TERM GOAL #3   Title  Patient will be able to increase her 6 min walk test distance by 50 ft    Baseline  589 ft -  03/19/2020    Period  Weeks    Status  Unable to assess        PT Long Term Goals - 03/19/20 1252      PT LONG TERM GOAL #1   Title  Patient will report that she rarely drops objects due to muscle weakness in order to decrease the amount of bending and lifting that she has to perform    Baseline  Pt. reports that muscle weakness causes her to drop objects    Time  10    Period  Weeks    Status  New    Target Date  05/06/20      PT LONG TERM GOAL #2   Title  Patient will comfortably achieve 60 degrees of cervical rotation in order to decrease the amount of upper body twisting when driving.    Baseline  Rt. 60/Lt. 56    Time  10    Period  Weeks    Status  New    Target Date  05/06/20      PT LONG TERM GOAL #3   Title  Patient will be able to score >/= a 50 on her BERG balance test in order to reduce unsteadiness and antalgic gait when walking    Baseline  43 BERG score - 03/19/2020    Time  7    Period  Weeks    Status  New    Target Date  05/06/20      PT LONG TERM GOAL #4   Title  Patient will be able to safely bend over and pick up object from the  floor with no assistance so that she can perform household activities and chores.    Baseline  Patient his unable to pick up objects from the floor without assistance.    Time  7    Period  Weeks    Target Date  05/06/20      PT LONG TERM GOAL #5   Title  Patient will report a max 5/10 LBP so that she is able to ride her tricycle with less difficulty    Time  7    Period  Weeks    Status  New    Target Date  05/06/20            Plan - 04/16/20 1102    Clinical Impression Statement  IASTM to reduce excessive tension in hip musculature and corrected gait to encourage heel-toe strike rather than lateral to medial strike. we also added CKC strength to HEP and encouraged her to keep her motion small to avoid pulling.    PT Treatment/Interventions  ADLs/Self Care Home Management;Cryotherapy;Electrical Stimulation;Iontophoresis 4mg /ml Dexamethasone;Moist Heat;Traction;Ultrasound;Stair training;Gait training;Functional mobility training;Therapeutic exercise;Therapeutic activities;Balance training;Neuromuscular re-education;Patient/family education;Manual techniques;Scar mobilization;Passive range of motion;Dry needling;Energy conservation;Taping;Visual/perceptual remediation/compensation;Joint Manipulations    PT Next Visit Plan  , UE/LE strengthening, gross hip strengthening,  hamstring activation, core strengthening, balance training, gait training with trekking poles    PT Home Exercise Plan  Cervical Isometric holds, Upper trap and levator stretches, Scapular retraction, Standing Hip abd. and ext, squats, lat lunges, heel/toe  [id number]    Consulted and Agree with Plan of Care  Patient       Patient will benefit from skilled therapeutic intervention in order to improve the following deficits and impairments:  Decreased activity tolerance, Decreased coordination, Decreased endurance, Decreased range of motion, Impaired flexibility, Improper body mechanics, Postural dysfunction, Pain  Visit  Diagnosis: Muscle weakness (generalized)  Unsteadiness on feet  Other muscle spasm  Acute right-sided low back pain without sciatica  Radiculopathy, cervical region  Other lack of coordination     Problem List Patient Active Problem List   Diagnosis Date Noted  . Spondylolisthesis of lumbar region 01/30/2020  . Lumbar radiculopathy 10/15/2019  . Spondylosis of lumbar spine 08/06/2019  . Lumbar facet arthropathy 02/05/2019  . Cervical myelopathy (HCC) 04/02/2018  . Bilateral arm weakness   . Numbness and tingling in both hands   . Paresthesia and pain of both upper extremities   . Trauma   . Post-operative pain   . Uncomplicated asthma   . Benign essential HTN   . Bradycardia   . Steroid-induced hyperglycemia   . 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 of cervical spinal cord (HCC) 03/30/2018  . S/P cervical spinal fusion 04/20/2016  . Cervical radicular pain 03/22/2016  . Hamstring tightness 12/11/2014  . 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 Pain in joint, upper arm 11/17/2014  . Labyrinthitis 05/15/2012  . Sinusitis acute 02/12/2012  . Well adult exam 06/27/2011  . Rash, skin 06/27/2011  . ADHESIVE CAPSULITIS, LEFT 11/09/2009  . SHOULDER PAIN 09/09/2009  . HAIR LOSS 07/24/2008  . ELEVATED BP 07/24/2008  . CARPAL TUNNEL SYNDROME 03/23/2008  . PARESTHESIA 03/23/2008  . MIGRAINE HEADACHE 08/02/2007    Khayman Kirsch C. Thurma Priego PT, DPT 04/16/20 11:04 AM   [facility] Outpatient Rehabilitation [facility] [address] [place], [place], [address] Phone: [phone]   Fax:  [phone]  Name: Kelly Wall MRN: [id number] Date of Birth: 1961-08-12

## 2020-04-20 ENCOUNTER — Other Ambulatory Visit: Payer: Self-pay

## 2020-04-20 ENCOUNTER — Ambulatory Visit: Payer: 59 | Admitting: Physical Therapy

## 2020-04-20 DIAGNOSIS — M5412 Radiculopathy, cervical region: Secondary | ICD-10-CM | POA: Diagnosis not present

## 2020-04-20 DIAGNOSIS — R278 Other lack of coordination: Secondary | ICD-10-CM | POA: Diagnosis not present

## 2020-04-20 DIAGNOSIS — M62838 Other muscle spasm: Secondary | ICD-10-CM | POA: Diagnosis not present

## 2020-04-20 DIAGNOSIS — R2681 Unsteadiness on feet: Secondary | ICD-10-CM

## 2020-04-20 DIAGNOSIS — M6281 Muscle weakness (generalized): Secondary | ICD-10-CM | POA: Diagnosis not present

## 2020-04-20 DIAGNOSIS — M48062 Spinal stenosis, lumbar region with neurogenic claudication: Secondary | ICD-10-CM | POA: Diagnosis not present

## 2020-04-20 DIAGNOSIS — M545 Low back pain, unspecified: Secondary | ICD-10-CM

## 2020-04-20 NOTE — Therapy (Signed)
[facility] Outpatient Rehabilitation [facility] [address] [place], [place], [address] Phone: [phone]   Fax:  [phone]  Physical Therapy Treatment  Patient Details  Name: Kelly Wall MRN: [id number] Date of Birth: [birth year] Referring Provider (PT): Coletta Memos, MD   Encounter Date: 04/20/2020  PT End of Session - 04/20/20 1022    Visit Number  13    Number of Visits  21    Date for PT Re-Evaluation  05/28/20    PT Start Time  1021   pt arrived 6 min late   PT Stop Time  1110    PT Time Calculation (min)  49 min    Activity Tolerance  Patient tolerated treatment well    Behavior During Therapy  [facility] for tasks assessed/performed       Past Medical History:  Diagnosis Date  . Asthma    child- occ exersie induced now  . Family history of adverse reaction to anesthesia    dad hard to awaken  . Hypertension    no rx  in 5 yrs fish oil helps now  . Paralysis [facility])     Past Surgical History:  Procedure Laterality Date  . ANTERIOR CERVICAL DECOMP/DISCECTOMY FUSION N/A 03/30/2018   Procedure: ANTERIOR CERVICAL DECOMPRESSION/DISCECTOMY FUSION Cervical four-five and Cervical five-six;  Surgeon: Coletta Memos, MD;  Location: [facility] OR;  Service: Neurosurgery;  Laterality: N/A;  . BACK SURGERY    . CERVICAL DISC ARTHROPLASTY N/A 04/20/2016   Procedure: Cervical Six-Seven Artificial Disc Replacement-Cervical;  Surgeon: Tia Alert, MD;  Location[facility];  Service: Neurosurgery;  Laterality: N/A;    There were no vitals filed for this visit.  Subjective Assessment - 04/20/20 1022    Subjective  " I am walking better. I got some of the trekking poles and I feel it making my legs more sore"    Patient Stated Goals  Get stronger, and be safer with twisting    Currently in Pain?  Yes    Pain Location  Arm    Pain Orientation  Right    Pain Descriptors / Indicators  Aching    Pain Type  Chronic pain    Pain Onset  More than a month ago    Multiple Pain Sites  Yes    Pain Score  5    Pain Location  Hip         [facility] PT Assessment - 04/20/20 0001      Assessment   Medical Diagnosis  Radiculopathy, cervical region     Referring Provider (PT)  Coletta Memos, MD                    [facility] Adult PT Treatment/Exercise - 04/20/20 0001      Lumbar Exercises: Quadruped   Other Quadruped Lumbar Exercises  rocking forward/ back going to prone position into quadruped position x 5 with mod assist to prevent pt from arching her back    with pillow placed under hips     Knee/Hip Exercises: Aerobic   Nustep  L7 x 10 min end of session.   UE/LE     Knee/Hip Exercises: Standing   Gait Training  trekking poles- cues for heel toe    Other Standing Knee Exercises  obstacle course stepping over 5 hurdles x 4 bil LE into  between ea step. x 4 1 LE in ea rung with then stepping up/ and over 8 inch steps   performed with trekking poles.  Knee/Hip Exercises: Prone   Hip Extension  Strengthening;Both;2 sets;10 reps   with knee flexed min assist to keep knee flexed on R     Manual Therapy   Manual therapy comments  MTPR r glute med, piriformis, r lumbar paraspinals    Soft tissue mobilization  IASTM bil gluts & piriformis               PT Short Term Goals - 04/02/20 1057      PT SHORT TERM GOAL #1   Title  Patient will be able to demonstrate proper posture w/o being cued.    Period  Weeks    Status  Achieved      PT SHORT TERM GOAL #2   Title  Patient will report that she was able to find comfortable stretching positions that do not put a lot of stress on her back    Period  Weeks    Status  Achieved      PT SHORT TERM GOAL #3   Title  Patient will be able to increase her 6 min walk test distance by 50 ft    Baseline  589 ft - 03/19/2020    Period  Weeks    Status  Unable to assess        PT Long Term Goals - 03/19/20 1252      PT LONG TERM GOAL #1   Title  Patient will report that she rarely  drops objects due to muscle weakness in order to decrease the amount of bending and lifting that she has to perform    Baseline  Pt. reports that muscle weakness causes her to drop objects    Time  10    Period  Weeks    Status  New    Target Date  05/06/20      PT LONG TERM GOAL #2   Title  Patient will comfortably achieve 60 degrees of cervical rotation in order to decrease the amount of upper body twisting when driving.    Baseline  Rt. 60/Lt. 56    Time  10    Period  Weeks    Status  New    Target Date  05/06/20      PT LONG TERM GOAL #3   Title  Patient will be able to score >/= a 50 on her BERG balance test in order to reduce unsteadiness and antalgic gait when walking    Baseline  43 BERG score - 03/19/2020    Time  7    Period  Weeks    Status  New    Target Date  05/06/20      PT LONG TERM GOAL #4   Title  Patient will be able to safely bend over and pick up object from the floor with no assistance so that she can perform household activities and chores.    Baseline  Patient his unable to pick up objects from the floor without assistance.    Time  7    Period  Weeks    Target Date  05/06/20      PT LONG TERM GOAL #5   Title  Patient will report a max 5/10 LBP so that she is able to ride her tricycle with less difficulty    Time  7    Period  Weeks    Status  New    Target Date  05/06/20            Plan -  04/20/20 1106    Clinical Impression Statement  continued STW along the R posterior hip musculature followed with prone hip extension to reduce compensation habits Practiced prone to quadruped rocking which she fatigued very quiclky and requird min-mod assist for proper form. continued practicing gait training with trekking poles stepping over hurdles, and gradually increasing step length which she did well but exhibited apprehension. She did requiring 1 x of Mod assist with stepping off of 8 inch step due to leg buckling.    PT Treatment/Interventions   ADLs/Self Care Home Management;Cryotherapy;Electrical Stimulation;Iontophoresis 4mg /ml Dexamethasone;Moist Heat;Traction;Ultrasound;Stair training;Gait training;Functional mobility training;Therapeutic exercise;Therapeutic activities;Balance training;Neuromuscular re-education;Patient/family education;Manual techniques;Scar mobilization;Passive range of motion;Dry needling;Energy conservation;Taping;Visual/perceptual remediation/compensation;Joint Manipulations    PT Next Visit Plan  , UE/LE strengthening, gross hip strengthening,  hamstring activation, core strengthening, balance training, gait training with trekking poles    PT Home Exercise Plan  Cervical Isometric holds, Upper trap and levator stretches, Scapular retraction, Standing Hip abd. and ext, squats, lat lunges, heel/toe  [id number]    Consulted and Agree with Plan of Care  Patient       Patient will benefit from skilled therapeutic intervention in order to improve the following deficits and impairments:  Decreased activity tolerance, Decreased coordination, Decreased endurance, Decreased range of motion, Impaired flexibility, Improper body mechanics, Postural dysfunction, Pain  Visit Diagnosis: Muscle weakness (generalized)  Unsteadiness on feet  Other muscle spasm  Acute right-sided low back pain without sciatica  Radiculopathy, cervical region  Other lack of coordination     Problem List Patient Active Problem List   Diagnosis Date Noted  . Spondylolisthesis of lumbar region 01/30/2020  . Lumbar radiculopathy 10/15/2019  . Spondylosis of lumbar spine 08/06/2019  . Lumbar facet arthropathy 02/05/2019  . Cervical myelopathy (HCC) 04/02/2018  . Bilateral arm weakness   . Numbness and tingling in both hands   . Paresthesia and pain of both upper extremities   . Trauma   . Post-operative pain   . Uncomplicated asthma   . Benign essential HTN   . Bradycardia   . Steroid-induced hyperglycemia   . Central cord syndrome  at C5 level of cervical spinal cord (HCC) 03/30/2018  . S/P cervical spinal fusion 04/20/2016  . Cervical radicular pain 03/22/2016  . Hamstring tightness 12/11/2014  . 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 Pain in joint, upper arm 11/17/2014  . Labyrinthitis 05/15/2012  . Sinusitis acute 02/12/2012  . Well adult exam 06/27/2011  . Rash, skin 06/27/2011  . ADHESIVE CAPSULITIS, LEFT 11/09/2009  . SHOULDER PAIN 09/09/2009  . HAIR LOSS 07/24/2008  . ELEVATED BP 07/24/2008  . CARPAL TUNNEL SYNDROME 03/23/2008  . PARESTHESIA 03/23/2008  . MIGRAINE HEADACHE 08/02/2007   10/02/2007 PT, DPT, LAT, ATC  04/20/20  11:10 AM      [facility] [address] [place], [place], [place] Phone: [phone]   Fax:  [phone]  Name: Kelly Wall MRN: Tami [id number] Date of Birth: 05/16/1961

## 2020-04-23 ENCOUNTER — Other Ambulatory Visit: Payer: Self-pay

## 2020-04-23 ENCOUNTER — Ambulatory Visit: Payer: 59 | Admitting: Physical Therapy

## 2020-04-23 ENCOUNTER — Encounter: Payer: Self-pay | Admitting: Physical Therapy

## 2020-04-23 DIAGNOSIS — R2681 Unsteadiness on feet: Secondary | ICD-10-CM

## 2020-04-23 DIAGNOSIS — R278 Other lack of coordination: Secondary | ICD-10-CM | POA: Diagnosis not present

## 2020-04-23 DIAGNOSIS — M6281 Muscle weakness (generalized): Secondary | ICD-10-CM | POA: Diagnosis not present

## 2020-04-23 DIAGNOSIS — M5412 Radiculopathy, cervical region: Secondary | ICD-10-CM | POA: Diagnosis not present

## 2020-04-23 DIAGNOSIS — M62838 Other muscle spasm: Secondary | ICD-10-CM | POA: Diagnosis not present

## 2020-04-23 DIAGNOSIS — M545 Low back pain: Secondary | ICD-10-CM | POA: Diagnosis not present

## 2020-04-23 NOTE — Therapy (Signed)
[facility] Outpatient Rehabilitation [facility] [address] [place], [place], [address] Phone: [phone]   Fax:  [phone]  Physical Therapy Treatment  Patient Details  Name: Kelly Wall MRN: [id number] Date of Birth: 1960-12-09 Referring Provider (PT): Coletta Memos, MD   Encounter Date: 04/23/2020  PT End of Session - 04/23/20 1023    Visit Number  14    Number of Visits  21    Date for PT Re-Evaluation  05/28/20    PT Start Time  1016    PT Stop Time  1100    PT Time Calculation (min)  44 min    Activity Tolerance  Patient tolerated treatment well    Behavior During Therapy  [facility] for tasks assessed/performed       Past Medical History:  Diagnosis Date  . Asthma    child- occ exersie induced now  . Family history of adverse reaction to anesthesia    dad hard to awaken  . Hypertension    no rx  in 5 yrs fish oil helps now  . Paralysis [facility])     Past Surgical History:  Procedure Laterality Date  . ANTERIOR CERVICAL DECOMP/DISCECTOMY FUSION N/A 03/30/2018   Procedure: ANTERIOR CERVICAL DECOMPRESSION/DISCECTOMY FUSION Cervical four-five and Cervical five-six;  Surgeon: Coletta Memos, MD;  Location: [facility] OR;  Service: Neurosurgery;  Laterality: N/A;  . BACK SURGERY    . CERVICAL DISC ARTHROPLASTY N/A 04/20/2016   Procedure: Cervical Six-Seven Artificial Disc Replacement-Cervical;  Surgeon: Tia Alert, MD;  Location: [facility];  Service: Neurosurgery;  Laterality: N/A;    There were no vitals filed for this visit.  Subjective Assessment - 04/23/20 1021    Subjective  I have been using the trekking poles and can recognize form. I did all of my exercises so I am sore.    Patient Stated Goals  Get stronger, and be safer with twisting                        [facility] Adult PT Treatment/Exercise - 04/23/20 0001      Knee/Hip Exercises: Aerobic   Nustep  6 min L6 Ue & LE      Knee/Hip Exercises: Seated   Long Arc Quad  Limitations  ball bw knees    Other Seated Knee/Hip Exercises  ball bw knees, reverse clam      Manual Therapy   Manual therapy comments  manual HS stretching    Soft tissue mobilization  IASTM bil gluts & piriformis             PT Education - 04/23/20 1059    Education Details  decreasing soreness and being ready for challenge in PT    Person(s) Educated  Patient    Methods  Explanation    Comprehension  Verbalized understanding;Need further instruction       PT Short Term Goals - 04/02/20 1057      PT SHORT TERM GOAL #1   Title  Patient will be able to demonstrate proper posture w/o being cued.    Period  Weeks    Status  Achieved      PT SHORT TERM GOAL #2   Title  Patient will report that she was able to find comfortable stretching positions that do not put a lot of stress on her back    Period  Weeks    Status  Achieved      PT SHORT TERM GOAL #3  Title  Patient will be able to increase her 6 min walk test distance by 50 ft    Baseline  589 ft - 03/19/2020    Period  Weeks    Status  Unable to assess        PT Long Term Goals - 03/19/20 1252      PT LONG TERM GOAL #1   Title  Patient will report that she rarely drops objects due to muscle weakness in order to decrease the amount of bending and lifting that she has to perform    Baseline  Pt. reports that muscle weakness causes her to drop objects    Time  10    Period  Weeks    Status  New    Target Date  05/06/20      PT LONG TERM GOAL #2   Title  Patient will comfortably achieve 60 degrees of cervical rotation in order to decrease the amount of upper body twisting when driving.    Baseline  Rt. 60/Lt. 56    Time  10    Period  Weeks    Status  New    Target Date  05/06/20      PT LONG TERM GOAL #3   Title  Patient will be able to score >/= a 50 on her BERG balance test in order to reduce unsteadiness and antalgic gait when walking    Baseline  43 BERG score - 03/19/2020    Time  7    Period   Weeks    Status  New    Target Date  05/06/20      PT LONG TERM GOAL #4   Title  Patient will be able to safely bend over and pick up object from the floor with no assistance so that she can perform household activities and chores.    Baseline  Patient his unable to pick up objects from the floor without assistance.    Time  7    Period  Weeks    Target Date  05/06/20      PT LONG TERM GOAL #5   Title  Patient will report a max 5/10 LBP so that she is able to ride her tricycle with less difficulty    Time  7    Period  Weeks    Status  New    Target Date  05/06/20            Plan - 04/23/20 1057    Clinical Impression Statement  Pt was very sore coming to PT today and required assistance to control her LE when in hooklying. Focused on manual therapy today to decrease tension in hip musculature and encouraged her to reduce her workload right before PT days so she is able to tolerate more exercise and effectively strengthen.    PT Treatment/Interventions  ADLs/Self Care Home Management;Cryotherapy;Electrical Stimulation;Iontophoresis 4mg /ml Dexamethasone;Moist Heat;Traction;Ultrasound;Stair training;Gait training;Functional mobility training;Therapeutic exercise;Therapeutic activities;Balance training;Neuromuscular re-education;Patient/family education;Manual techniques;Scar mobilization;Passive range of motion;Dry needling;Energy conservation;Taping;Visual/perceptual remediation/compensation;Joint Manipulations    PT Next Visit Plan  reformer when available, Rt hip proprioception, core stretch    PT Home Exercise Plan  Cervical Isometric holds, Upper trap and levator stretches, Scapular retraction, Standing Hip abd. and ext, squats, lat lunges, heel/toe  [id number]    Consulted and Agree with Plan of Care  Patient       Patient will benefit from skilled therapeutic intervention in order to improve the following deficits and impairments:  Decreased activity tolerance,  Decreased  coordination, Decreased endurance, Decreased range of motion, Impaired flexibility, Improper body mechanics, Postural dysfunction, Pain  Visit Diagnosis: Muscle weakness (generalized)  Unsteadiness on feet     Problem List Patient Active Problem List   Diagnosis Date Noted  . Spondylolisthesis of lumbar region 01/30/2020  . Lumbar radiculopathy 10/15/2019  . Spondylosis of lumbar spine 08/06/2019  . Lumbar facet arthropathy 02/05/2019  . Cervical myelopathy ([place]) 04/02/2018  . Bilateral arm weakness   . Numbness and tingling in both hands   . Paresthesia and pain of both upper extremities   . Trauma   . Post-operative pain   . Uncomplicated asthma   . Benign essential HTN   . Bradycardia   . Steroid-induced hyperglycemia   . 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 of cervical spinal cord ([place]) 03/30/2018  . S/P cervical spinal fusion 04/20/2016  . Cervical radicular pain 03/22/2016  . Hamstring tightness 12/11/2014  . 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 Pain in joint, upper arm 11/17/2014  . Labyrinthitis 05/15/2012  . Sinusitis acute 02/12/2012  . Well adult exam 06/27/2011  . Rash, skin 06/27/2011  . ADHESIVE CAPSULITIS, LEFT 11/09/2009  . SHOULDER PAIN 09/09/2009  . HAIR LOSS 07/24/2008  . ELEVATED BP 07/24/2008  . CARPAL TUNNEL SYNDROME 03/23/2008  . PARESTHESIA 03/23/2008  . MIGRAINE HEADACHE 08/02/2007    Lashundra Shiveley C. Kabir Brannock PT, DPT 04/23/20 1:09 PM   [facility] Outpatient Rehabilitation [facility] [address] [place], [place], [address] Phone: [phone]   Fax:  [phone]  Name: Kelly Wall MRN: [id number] Date of Birth: 1961/06/20

## 2020-04-27 ENCOUNTER — Other Ambulatory Visit: Payer: Self-pay

## 2020-04-27 ENCOUNTER — Ambulatory Visit: Payer: 59 | Attending: Neurosurgery | Admitting: Physical Therapy

## 2020-04-27 ENCOUNTER — Encounter: Payer: Self-pay | Admitting: Physical Therapy

## 2020-04-27 DIAGNOSIS — R2681 Unsteadiness on feet: Secondary | ICD-10-CM | POA: Diagnosis not present

## 2020-04-27 DIAGNOSIS — R278 Other lack of coordination: Secondary | ICD-10-CM | POA: Insufficient documentation

## 2020-04-27 DIAGNOSIS — M62838 Other muscle spasm: Secondary | ICD-10-CM | POA: Insufficient documentation

## 2020-04-27 DIAGNOSIS — M6281 Muscle weakness (generalized): Secondary | ICD-10-CM | POA: Diagnosis not present

## 2020-04-27 DIAGNOSIS — M545 Low back pain: Secondary | ICD-10-CM | POA: Insufficient documentation

## 2020-04-27 DIAGNOSIS — M5412 Radiculopathy, cervical region: Secondary | ICD-10-CM | POA: Diagnosis not present

## 2020-04-27 NOTE — Therapy (Signed)
[facility] Outpatient Rehabilitation [facility] [address] [place], [place], [address] Phone: [phone]   Fax:  [phone]  Physical Therapy Treatment  Patient Details  Name: Kelly Wall MRN: [id number] Date of Birth: [birth year] Referring Provider (PT): Coletta Memos, MD   Encounter Date: 04/27/2020  PT End of Session - 04/27/20 1201    Visit Number  15    Number of Visits  21    Date for PT Re-Evaluation  05/28/20    PT Start Time  1201   pt arrived 16 min late today   PT Stop Time  1235    PT Time Calculation (min)  34 min    Activity Tolerance  Patient tolerated treatment well    Behavior During Therapy  [facility] for tasks assessed/performed       Past Medical History:  Diagnosis Date  . Asthma    child- occ exersie induced now  . Family history of adverse reaction to anesthesia    dad hard to awaken  . Hypertension    no rx  in 5 yrs fish oil helps now  . Paralysis [facility])     Past Surgical History:  Procedure Laterality Date  . ANTERIOR CERVICAL DECOMP/DISCECTOMY FUSION N/A 03/30/2018   Procedure: ANTERIOR CERVICAL DECOMPRESSION/DISCECTOMY FUSION Cervical four-five and Cervical five-six;  Surgeon: Coletta Memos, MD;  Location: [facility] OR;  Service: Neurosurgery;  Laterality: N/A;  . BACK SURGERY    . CERVICAL DISC ARTHROPLASTY N/A 04/20/2016   Procedure: Cervical Six-Seven Artificial Disc Replacement-Cervical;  Surgeon: Tia Alert, MD;  Location: [facility];  Service: Neurosurgery;  Laterality: N/A;    There were no vitals filed for this visit.      [facility] PT Assessment - 04/27/20 0001      Assessment   Medical Diagnosis  Radiculopathy, cervical region     Referring Provider (PT)  Coletta Memos, MD                    [facility] Adult PT Treatment/Exercise - 04/27/20 0001      Lumbar Exercises: Stretches   Single Knee to Chest Stretch  2 reps;30 seconds;Left;Right    Lower Trunk Rotation  5 reps;20 seconds   at end ranges      Knee/Hip Exercises: Aerobic   Nustep  L7 x 5 min UE/LE      Knee/Hip Exercises: Seated   Sit to Sand  1 set;10 reps      Manual Therapy   Manual therapy comments  manual HS stretching    Joint Mobilization  grade II R/ LLE LAD with gentle oscillations     Soft tissue mobilization  IASTM bil gluts & piriformis               PT Short Term Goals - 04/02/20 1057      PT SHORT TERM GOAL #1   Title  Patient will be able to demonstrate proper posture w/o being cued.    Period  Weeks    Status  Achieved      PT SHORT TERM GOAL #2   Title  Patient will report that she was able to find comfortable stretching positions that do not put a lot of stress on her back    Period  Weeks    Status  Achieved      PT SHORT TERM GOAL #3   Title  Patient will be able to increase her 6 min walk test distance by 50 ft  Baseline  589 ft - 03/19/2020    Period  Weeks    Status  Unable to assess        PT Long Term Goals - 03/19/20 1252      PT LONG TERM GOAL #1   Title  Patient will report that she rarely drops objects due to muscle weakness in order to decrease the amount of bending and lifting that she has to perform    Baseline  Pt. reports that muscle weakness causes her to drop objects    Time  10    Period  Weeks    Status  New    Target Date  05/06/20      PT LONG TERM GOAL #2   Title  Patient will comfortably achieve 60 degrees of cervical rotation in order to decrease the amount of upper body twisting when driving.    Baseline  Rt. 60/Lt. 56    Time  10    Period  Weeks    Status  New    Target Date  05/06/20      PT LONG TERM GOAL #3   Title  Patient will be able to score >/= a 50 on her BERG balance test in order to reduce unsteadiness and antalgic gait when walking    Baseline  43 BERG score - 03/19/2020    Time  7    Period  Weeks    Status  New    Target Date  05/06/20      PT LONG TERM GOAL #4   Title  Patient will be able to safely bend over and pick up  object from the floor with no assistance so that she can perform household activities and chores.    Baseline  Patient his unable to pick up objects from the floor without assistance.    Time  7    Period  Weeks    Target Date  05/06/20      PT LONG TERM GOAL #5   Title  Patient will report a max 5/10 LBP so that she is able to ride her tricycle with less difficulty    Time  7    Period  Weeks    Status  New    Target Date  05/06/20            Plan - 04/27/20 1236    Clinical Impression Statement  limited session due to pat arriving 16 min late today. She reports increased soreness today secondary to starting back at work and stood for 75% of the time due to feeling sore while seatedin the chair. focused on STW along bil lumbar paraspinals and hip strengthening. reviewed decreasing activity to help accomodate returning to work before she starts adding more to her regiment.    PT Treatment/Interventions  ADLs/Self Care Home Management;Cryotherapy;Electrical Stimulation;Iontophoresis 4mg /ml Dexamethasone;Moist Heat;Traction;Ultrasound;Stair training;Gait training;Functional mobility training;Therapeutic exercise;Therapeutic activities;Balance training;Neuromuscular re-education;Patient/family education;Manual techniques;Scar mobilization;Passive range of motion;Dry needling;Energy conservation;Taping;Visual/perceptual remediation/compensation;Joint Manipulations    PT Next Visit Plan  reformer when available, Rt hip proprioception, core stretch    PT Home Exercise Plan  Cervical Isometric holds, Upper trap and levator stretches, Scapular retraction, Standing Hip abd. and ext, squats, lat lunges, heel/toe  [id number]    Consulted and Agree with Plan of Care  Patient       Patient will benefit from skilled therapeutic intervention in order to improve the following deficits and impairments:  Decreased activity tolerance, Decreased coordination, Decreased endurance, Decreased range of motion,  Impaired flexibility, Improper body mechanics, Postural dysfunction, Pain  Visit Diagnosis: Muscle weakness (generalized)  Unsteadiness on feet  Other muscle spasm     Problem List Patient Active Problem List   Diagnosis Date Noted  . Spondylolisthesis of lumbar region 01/30/2020  . Lumbar radiculopathy 10/15/2019  . Spondylosis of lumbar spine 08/06/2019  . Lumbar facet arthropathy 02/05/2019  . Cervical myelopathy ([place]) 04/02/2018  . Bilateral arm weakness   . Numbness and tingling in both hands   . Paresthesia and pain of both upper extremities   . Trauma   . Post-operative pain   . Uncomplicated asthma   . Benign essential HTN   . Bradycardia   . Steroid-induced hyperglycemia   . 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 of cervical spinal cord ([place]) 03/30/2018  . S/P cervical spinal fusion 04/20/2016  . Cervical radicular pain 03/22/2016  . Hamstring tightness 12/11/2014  . 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 Pain in joint, upper arm 11/17/2014  . Labyrinthitis 05/15/2012  . Sinusitis acute 02/12/2012  . Well adult exam 06/27/2011  . Rash, skin 06/27/2011  . ADHESIVE CAPSULITIS, LEFT 11/09/2009  . SHOULDER PAIN 09/09/2009  . HAIR LOSS 07/24/2008  . ELEVATED BP 07/24/2008  . CARPAL TUNNEL SYNDROME 03/23/2008  . PARESTHESIA 03/23/2008  . MIGRAINE HEADACHE 08/02/2007   [name] PT, DPT, LAT, ATC  04/27/20  12:41 PM      [facility] [address] [place], [place], [address] Phone: [phone]   Fax:  [phone]  Name: RYLLIE NIELAND MRN: [id number] Date of Birth: 04-Oct-1961

## 2020-05-06 ENCOUNTER — Other Ambulatory Visit: Payer: Self-pay

## 2020-05-06 ENCOUNTER — Ambulatory Visit: Payer: 59 | Admitting: Physical Therapy

## 2020-05-06 DIAGNOSIS — M62838 Other muscle spasm: Secondary | ICD-10-CM | POA: Diagnosis not present

## 2020-05-06 DIAGNOSIS — M5412 Radiculopathy, cervical region: Secondary | ICD-10-CM | POA: Diagnosis not present

## 2020-05-06 DIAGNOSIS — R2681 Unsteadiness on feet: Secondary | ICD-10-CM

## 2020-05-06 DIAGNOSIS — M545 Low back pain, unspecified: Secondary | ICD-10-CM

## 2020-05-06 DIAGNOSIS — R278 Other lack of coordination: Secondary | ICD-10-CM | POA: Diagnosis not present

## 2020-05-06 DIAGNOSIS — M6281 Muscle weakness (generalized): Secondary | ICD-10-CM

## 2020-05-06 NOTE — Therapy (Signed)
[facility] Outpatient Rehabilitation [facility] [address] [place], [place], [address] Phone: [phone]   Fax:  [phone]  Physical Therapy Treatment  Patient Details  Name: Kelly Wall MRN: [id number] Date of Birth: [birth year] Referring Provider (PT): Coletta Memos, MD   Encounter Date: 05/06/2020   PT End of Session - 05/06/20 1412    Visit Number 16    Number of Visits 21    Date for PT Re-Evaluation 05/28/20    PT Start Time 1409    PT Stop Time 1451    PT Time Calculation (min) 42 min    Activity Tolerance Patient tolerated treatment well    Behavior During Therapy [facility] for tasks assessed/performed           Past Medical History:  Diagnosis Date  . Asthma    child- occ exersie induced now  . Family history of adverse reaction to anesthesia    dad hard to awaken  . Hypertension    no rx  in 5 yrs fish oil helps now  . Paralysis [facility])     Past Surgical History:  Procedure Laterality Date  . ANTERIOR CERVICAL DECOMP/DISCECTOMY FUSION N/A 03/30/2018   Procedure: ANTERIOR CERVICAL DECOMPRESSION/DISCECTOMY FUSION Cervical four-five and Cervical five-six;  Surgeon: Coletta Memos, MD;  Location: [facility] OR;  Service: Neurosurgery;  Laterality: N/A;  . BACK SURGERY    . CERVICAL DISC ARTHROPLASTY N/A 04/20/2016   Procedure: Cervical Six-Seven Artificial Disc Replacement-Cervical;  Surgeon: Tia Alert, MD;  Location: [facility];  Service: Neurosurgery;  Laterality: N/A;    There were no vitals filed for this visit.   Subjective Assessment - 05/06/20 1413    Subjective " I am noticing more stiffness inthe back and soreness in my arms, I think its the weather"    Currently in Pain? Yes    Pain Score 5     Pain Orientation Right    Pain Type Chronic pain    Pain Onset More than a month ago    Pain Frequency Intermittent    Pain Score 5    Pain Location Back    Pain Score 5    Pain Location Back    Pain Orientation Lower               [facility] PT Assessment - 05/06/20 0001      Assessment   Medical Diagnosis Radiculopathy, cervical region     Referring Provider (PT) Coletta Memos, MD                         [facility] Adult PT Treatment/Exercise - 05/06/20 0001      Pilates   Other Pilates reformer: foot work leg press with 2 red 1 yellow 1 blue 2 x 20 ea.      Lumbar Exercises: Stretches   Active Hamstring Stretch 2 reps;30 seconds;Right;Left    Single Knee to Chest Stretch 2 reps;30 seconds;Right;Left      Lumbar Exercises: Supine   Bent Knee Raise 15 reps   x 2 sets alternating L/R     Knee/Hip Exercises: Aerobic   Nustep L8 x 6 min UE/LE      Knee/Hip Exercises: Standing   Other Standing Knee Exercises marching in place with bil HHA 2 x 15   cues for smooth controlled motion     Manual Therapy   Joint Mobilization grade II-III AP grade bil hip mobs  PT Short Term Goals - 04/02/20 1057      PT SHORT TERM GOAL #1   Title Patient will be able to demonstrate proper posture w/o being cued.    Period Weeks    Status Achieved      PT SHORT TERM GOAL #2   Title Patient will report that she was able to find comfortable stretching positions that do not put a lot of stress on her back    Period Weeks    Status Achieved      PT SHORT TERM GOAL #3   Title Patient will be able to increase her 6 min walk test distance by 50 ft    Baseline 589 ft - 03/19/2020    Period Weeks    Status Unable to assess             PT Long Term Goals - 03/19/20 1252      PT LONG TERM GOAL #1   Title Patient will report that she rarely drops objects due to muscle weakness in order to decrease the amount of bending and lifting that she has to perform    Baseline Pt. reports that muscle weakness causes her to drop objects    Time 10    Period Weeks    Status New    Target Date 05/06/20      PT LONG TERM GOAL #2   Title Patient will comfortably achieve 60 degrees of  cervical rotation in order to decrease the amount of upper body twisting when driving.    Baseline Rt. 60/Lt. 56    Time 10    Period Weeks    Status New    Target Date 05/06/20      PT LONG TERM GOAL #3   Title Patient will be able to score >/= a 50 on her BERG balance test in order to reduce unsteadiness and antalgic gait when walking    Baseline 43 BERG score - 03/19/2020    Time 7    Period Weeks    Status New    Target Date 05/06/20      PT LONG TERM GOAL #4   Title Patient will be able to safely bend over and pick up object from the floor with no assistance so that she can perform household activities and chores.    Baseline Patient his unable to pick up objects from the floor without assistance.    Time 7    Period Weeks    Target Date 05/06/20      PT LONG TERM GOAL #5   Title Patient will report a max 5/10 LBP so that she is able to ride her tricycle with less difficulty    Time 7    Period Weeks    Status New    Target Date 05/06/20                 Plan - 05/06/20 1457    Clinical Impression Statement pt reports continued stiffness and intermittent spasm today. continued hip/ and core strengthening Sport and exercise psychologist today which she did well but did note intermittent stiffness. directed focus on smooth controlled movement versus spasm and tightness.    PT Next Visit Plan reformer when available, Rt hip proprioception, core stretch, how was working on Visual merchandiser?    PT Home Exercise Plan Cervical Isometric holds, Upper trap and levator stretches, Scapular retraction, Standing Hip abd. and ext, squats, lat lunges, heel/toe  [id number]    Consulted and Agree with  Plan of Care Patient           Patient will benefit from skilled therapeutic intervention in order to improve the following deficits and impairments:  Decreased activity tolerance, Decreased coordination, Decreased endurance, Decreased range of motion, Impaired flexibility, Improper body mechanics, Postural  dysfunction, Pain  Visit Diagnosis: Muscle weakness (generalized)  Unsteadiness on feet  Other muscle spasm  Acute right-sided low back pain without sciatica     Problem List Patient Active Problem List   Diagnosis Date Noted  . Spondylolisthesis of lumbar region 01/30/2020  . Lumbar radiculopathy 10/15/2019  . Spondylosis of lumbar spine 08/06/2019  . Lumbar facet arthropathy 02/05/2019  . Cervical myelopathy (HCC) 04/02/2018  . Bilateral arm weakness   . Numbness and tingling in both hands   . Paresthesia and pain of both upper extremities   . Trauma   . Post-operative pain   . Uncomplicated asthma   . Benign essential HTN   . Bradycardia   . Steroid-induced hyperglycemia   . 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 of cervical spinal cord (HCC) 03/30/2018  . S/P cervical spinal fusion 04/20/2016  . Cervical radicular pain 03/22/2016  . Hamstring tightness 12/11/2014  . 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 Pain in joint, upper arm 11/17/2014  . Labyrinthitis 05/15/2012  . Sinusitis acute 02/12/2012  . Well adult exam 06/27/2011  . Rash, skin 06/27/2011  . ADHESIVE CAPSULITIS, LEFT 11/09/2009  . SHOULDER PAIN 09/09/2009  . HAIR LOSS 07/24/2008  . ELEVATED BP 07/24/2008  . CARPAL TUNNEL SYNDROME 03/23/2008  . PARESTHESIA 03/23/2008  . MIGRAINE HEADACHE 08/02/2007   Lulu Riding PT, DPT, LAT, ATC  05/06/20  3:02 PM      [facility] Outpatient Rehabilitation [facility] [address] [place], [place], [address] Phone: [phone]   Fax:  [phone]  Name: Kelly Wall MRN: [id number] Date of Birth: 07/16/1961

## 2020-05-11 ENCOUNTER — Encounter: Payer: Self-pay | Admitting: Physical Therapy

## 2020-05-11 ENCOUNTER — Other Ambulatory Visit: Payer: Self-pay

## 2020-05-11 ENCOUNTER — Ambulatory Visit: Payer: 59 | Admitting: Physical Therapy

## 2020-05-11 DIAGNOSIS — M5412 Radiculopathy, cervical region: Secondary | ICD-10-CM | POA: Diagnosis not present

## 2020-05-11 DIAGNOSIS — M6281 Muscle weakness (generalized): Secondary | ICD-10-CM

## 2020-05-11 DIAGNOSIS — R278 Other lack of coordination: Secondary | ICD-10-CM

## 2020-05-11 DIAGNOSIS — M62838 Other muscle spasm: Secondary | ICD-10-CM

## 2020-05-11 DIAGNOSIS — R2681 Unsteadiness on feet: Secondary | ICD-10-CM | POA: Diagnosis not present

## 2020-05-11 DIAGNOSIS — M545 Low back pain, unspecified: Secondary | ICD-10-CM

## 2020-05-11 MED FILL — PREGABALIN 100 MG CAPS: 100 | 30 days supply | Qty: 60 | Fill #3

## 2020-05-11 NOTE — Therapy (Signed)
[facility] Outpatient Rehabilitation [facility] [address] [place], [place], [address] Phone: [phone]   Fax:  [phone]  Physical Therapy Treatment  Patient Details  Name: Kelly Wall MRN: [id number] Date of Birth: [birth year] Referring Provider (PT): Coletta Memos, MD   Encounter Date: 05/11/2020   PT End of Session - 05/11/20 1346    Visit Number 17    Number of Visits 21    Date for PT Re-Evaluation 05/28/20    PT Start Time 1341   pt requested to be seen at 1:30pm but arrived at 1:41   PT Stop Time 1414    PT Time Calculation (min) 33 min    Activity Tolerance Patient tolerated treatment well    Behavior During Therapy [facility] for tasks assessed/performed           Past Medical History:  Diagnosis Date  . Asthma    child- occ exersie induced now  . Family history of adverse reaction to anesthesia    dad hard to awaken  . Hypertension    no rx  in 5 yrs fish oil helps now  . Paralysis [facility])     Past Surgical History:  Procedure Laterality Date  . ANTERIOR CERVICAL DECOMP/DISCECTOMY FUSION N/A 03/30/2018   Procedure: ANTERIOR CERVICAL DECOMPRESSION/DISCECTOMY FUSION Cervical four-five and Cervical five-six;  Surgeon: Coletta Memos, MD;  Location: [facility] OR;  Service: Neurosurgery;  Laterality: N/A;  . BACK SURGERY    . CERVICAL DISC ARTHROPLASTY N/A 04/20/2016   Procedure: Cervical Six-Seven Artificial Disc Replacement-Cervical;  Surgeon: Tia Alert, MD;  Location: [facility];  Service: Neurosurgery;  Laterality: N/A;    There were no vitals filed for this visit.   Subjective Assessment - 05/11/20 1343    Subjective "I am feelin gmore stiff, I just started going to 6 hour shifts and it is really hard on me"    Pain Score 6     Pain Orientation Right    Pain Descriptors / Indicators Aching;Tightness    Pain Type Chronic pain    Pain Onset More than a month ago              [facility] PT Assessment - 05/11/20 0001      Assessment    Medical Diagnosis Radiculopathy, cervical region     Referring Provider (PT) Coletta Memos, MD                         [facility] Adult PT Treatment/Exercise - 05/11/20 0001      Lumbar Exercises: Aerobic   Nustep L8 x 5 min UE & LE      Lumbar Exercises: Supine   Bent Knee Raise 20 reps      Lumbar Exercises: Prone   Other Prone Lumbar Exercises prone over red physioball 2 x10 hip extension       Manual Therapy   Manual therapy comments MTPR along bil hip flexor x 2 ea. R lumbar parapsinals x 2    Joint Mobilization grade II-III AP grade bil hip mobs, RLE grade IV LAD and LLE LAD grade IV                    PT Short Term Goals - 04/02/20 1057      PT SHORT TERM GOAL #1   Title Patient will be able to demonstrate proper posture w/o being cued.    Period Weeks    Status Achieved  PT SHORT TERM GOAL #2   Title Patient will report that she was able to find comfortable stretching positions that do not put a lot of stress on her back    Period Weeks    Status Achieved      PT SHORT TERM GOAL #3   Title Patient will be able to increase her 6 min walk test distance by 50 ft    Baseline 589 ft - 03/19/2020    Period Weeks    Status Unable to assess             PT Long Term Goals - 03/19/20 1252      PT LONG TERM GOAL #1   Title Patient will report that she rarely drops objects due to muscle weakness in order to decrease the amount of bending and lifting that she has to perform    Baseline Pt. reports that muscle weakness causes her to drop objects    Time 10    Period Weeks    Status New    Target Date 05/06/20      PT LONG TERM GOAL #2   Title Patient will comfortably achieve 60 degrees of cervical rotation in order to decrease the amount of upper body twisting when driving.    Baseline Rt. 60/Lt. 56    Time 10    Period Weeks    Status New    Target Date 05/06/20      PT LONG TERM GOAL #3   Title Patient will be able to score >/= a 50 on  her BERG balance test in order to reduce unsteadiness and antalgic gait when walking    Baseline 43 BERG score - 03/19/2020    Time 7    Period Weeks    Status New    Target Date 05/06/20      PT LONG TERM GOAL #4   Title Patient will be able to safely bend over and pick up object from the floor with no assistance so that she can perform household activities and chores.    Baseline Patient his unable to pick up objects from the floor without assistance.    Time 7    Period Weeks    Target Date 05/06/20      PT LONG TERM GOAL #5   Title Patient will report a max 5/10 LBP so that she is able to ride her tricycle with less difficulty    Time 7    Period Weeks    Status New    Target Date 05/06/20                 Plan - 05/11/20 1416    Clinical Impression Statement pt initally reported increaesd stiffness in bil hip flexors and along her R lumbar paraspinals. following trigger point release techniques and hip/ core strengthening she noted significant relief of pain/ tension in the hip/ back.    PT Next Visit Plan reformer when available, Rt hip proprioception, core stretch, how was working on reformer? continued pronte core/ hip strength using physioball.           Patient will benefit from skilled therapeutic intervention in order to improve the following deficits and impairments:  Decreased activity tolerance, Decreased coordination, Decreased endurance, Decreased range of motion, Impaired flexibility, Improper body mechanics, Postural dysfunction, Pain  Visit Diagnosis: Muscle weakness (generalized)  Unsteadiness on feet  Other muscle spasm  Acute right-sided low back pain without sciatica  Radiculopathy, cervical region  Other  lack of coordination     Problem List Patient Active Problem List   Diagnosis Date Noted  . Spondylolisthesis of lumbar region 01/30/2020  . Lumbar radiculopathy 10/15/2019  . Spondylosis of lumbar spine 08/06/2019  . Lumbar facet  arthropathy 02/05/2019  . Cervical myelopathy ([place]) 04/02/2018  . Bilateral arm weakness   . Numbness and tingling in both hands   . Paresthesia and pain of both upper extremities   . Trauma   . Post-operative pain   . Uncomplicated asthma   . Benign essential HTN   . Bradycardia   . Steroid-induced hyperglycemia   . 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 of cervical spinal cord ([place]) 03/30/2018  . S/P cervical spinal fusion 04/20/2016  . Cervical radicular pain 03/22/2016  . Hamstring tightness 12/11/2014  . 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 Pain in joint, upper arm 11/17/2014  . Labyrinthitis 05/15/2012  . Sinusitis acute 02/12/2012  . Well adult exam 06/27/2011  . Rash, skin 06/27/2011  . ADHESIVE CAPSULITIS, LEFT 11/09/2009  . SHOULDER PAIN 09/09/2009  . HAIR LOSS 07/24/2008  . ELEVATED BP 07/24/2008  . CARPAL TUNNEL SYNDROME 03/23/2008  . PARESTHESIA 03/23/2008  . MIGRAINE HEADACHE 08/02/2007   [name] PT, DPT, LAT, ATC  05/11/20  2:19 PM      [facility] [address] [place], [place], [address] Phone: [phone]   Fax:  [phone]  Name: Kelly Wall MRN: [id number] Date of Birth: 1961/09/29

## 2020-05-12 ENCOUNTER — Other Ambulatory Visit (HOSPITAL_COMMUNITY): Payer: Self-pay | Admitting: Dermatology

## 2020-05-12 MED FILL — TRETINOIN 0.025% CREAM: 0.025 | 90 days supply | Qty: 45 | Fill #0

## 2020-05-14 ENCOUNTER — Other Ambulatory Visit: Payer: Self-pay

## 2020-05-14 ENCOUNTER — Ambulatory Visit: Payer: 59 | Admitting: Physical Therapy

## 2020-05-14 DIAGNOSIS — M545 Low back pain, unspecified: Secondary | ICD-10-CM

## 2020-05-14 DIAGNOSIS — R278 Other lack of coordination: Secondary | ICD-10-CM | POA: Diagnosis not present

## 2020-05-14 DIAGNOSIS — M5412 Radiculopathy, cervical region: Secondary | ICD-10-CM

## 2020-05-14 DIAGNOSIS — R2681 Unsteadiness on feet: Secondary | ICD-10-CM | POA: Diagnosis not present

## 2020-05-14 DIAGNOSIS — M6281 Muscle weakness (generalized): Secondary | ICD-10-CM

## 2020-05-14 DIAGNOSIS — M62838 Other muscle spasm: Secondary | ICD-10-CM | POA: Diagnosis not present

## 2020-05-14 NOTE — Therapy (Signed)
[facility] Outpatient Rehabilitation [facility] [address] [place], [place], [address] Phone: [phone]   Fax:  [phone]  Physical Therapy Treatment  Patient Details  Name: Kelly Wall MRN: [id number] Date of Birth: May 29, 1961 Referring Provider (PT): Coletta Memos, MD   Encounter Date: 05/14/2020   PT End of Session - 05/14/20 1003    Visit Number 18    Number of Visits 21    Date for PT Re-Evaluation 05/28/20    PT Start Time 1001    PT Stop Time 1044    PT Time Calculation (min) 43 min    Activity Tolerance Patient tolerated treatment well    Behavior During Therapy [facility] for tasks assessed/performed           Past Medical History:  Diagnosis Date  . Asthma    child- occ exersie induced now  . Family history of adverse reaction to anesthesia    dad hard to awaken  . Hypertension    no rx  in 5 yrs fish oil helps now  . Paralysis [facility])     Past Surgical History:  Procedure Laterality Date  . ANTERIOR CERVICAL DECOMP/DISCECTOMY FUSION N/A 03/30/2018   Procedure: ANTERIOR CERVICAL DECOMPRESSION/DISCECTOMY FUSION Cervical four-five and Cervical five-six;  Surgeon: Coletta Memos, MD;  Location: [facility] OR;  Service: Neurosurgery;  Laterality: N/A;  . BACK SURGERY    . CERVICAL DISC ARTHROPLASTY N/A 04/20/2016   Procedure: Cervical Six-Seven Artificial Disc Replacement-Cervical;  Surgeon: Tia Alert, MD;  Location: [facility];  Service: Neurosurgery;  Laterality: N/A;    There were no vitals filed for this visit.   Subjective Assessment - 05/14/20 1004    Subjective " I feel like I am doing okay today. I felt pretty good after the last session but it didn't last too long."    Patient Stated Goals Get stronger, and be safer with twisting    Currently in Pain? Yes    Pain Score 5     Pain Orientation Right    Pain Descriptors / Indicators Aching;Tightness    Pain Type Chronic pain    Aggravating Factors  prolong walking/ standing    Pain  Relieving Factors medication              [facility] PT Assessment - 05/14/20 0001      Assessment   Medical Diagnosis Radiculopathy, cervical region     Referring Provider (PT) Coletta Memos, MD                         [facility] Adult PT Treatment/Exercise - 05/14/20 0001      Lumbar Exercises: Aerobic   Nustep L8 x 5 min UE & LE      Lumbar Exercises: Supine   Dead Bug 5 reps   wiht blue ball between knees hold10 sec hold   Dead Bug Limitations 1 x 10 with heels on red physioball lifting into dead bug position holding 10 seconds      Lumbar Exercises: Quadruped   Single Arm Raise 10 reps   x 2 sets laying on red physioball    Straight Leg Raise 10 reps   bil keeping knee flexed at 90 x 2 sets laying on red physiob   Other Quadruped Lumbar Exercises fire hydrant hip abduction 2 x 10 bil    x 2 sets laying on red physioball      Knee/Hip Exercises: Supine   Bridges Strengthening;Both;2 sets;15 reps  with heels proppped on the red physioball   Bridges Limitations mini bridge    Other Supine Knee/Hip Exercises 2 x 20 with heels propped on physioball bil hip flexion      Manual Therapy   Manual therapy comments MTPR along bil hip flexor x 2 ea. R lumbar parapsinals x 2    Joint Mobilization grade II-III AP grade bil hip mobs, RLE grade IV LAD and LLE LAD grade IV                    PT Short Term Goals - 04/02/20 1057      PT SHORT TERM GOAL #1   Title Patient will be able to demonstrate proper posture w/o being cued.    Period Weeks    Status Achieved      PT SHORT TERM GOAL #2   Title Patient will report that she was able to find comfortable stretching positions that do not put a lot of stress on her back    Period Weeks    Status Achieved      PT SHORT TERM GOAL #3   Title Patient will be able to increase her 6 min walk test distance by 50 ft    Baseline 589 ft - 03/19/2020    Period Weeks    Status Unable to assess             PT Long Term  Goals - 03/19/20 1252      PT LONG TERM GOAL #1   Title Patient will report that she rarely drops objects due to muscle weakness in order to decrease the amount of bending and lifting that she has to perform    Baseline Pt. reports that muscle weakness causes her to drop objects    Time 10    Period Weeks    Status New    Target Date 05/06/20      PT LONG TERM GOAL #2   Title Patient will comfortably achieve 60 degrees of cervical rotation in order to decrease the amount of upper body twisting when driving.    Baseline Rt. 60/Lt. 56    Time 10    Period Weeks    Status New    Target Date 05/06/20      PT LONG TERM GOAL #3   Title Patient will be able to score >/= a 50 on her BERG balance test in order to reduce unsteadiness and antalgic gait when walking    Baseline 43 BERG score - 03/19/2020    Time 7    Period Weeks    Status New    Target Date 05/06/20      PT LONG TERM GOAL #4   Title Patient will be able to safely bend over and pick up object from the floor with no assistance so that she can perform household activities and chores.    Baseline Patient his unable to pick up objects from the floor without assistance.    Time 7    Period Weeks    Target Date 05/06/20      PT LONG TERM GOAL #5   Title Patient will report a max 5/10 LBP so that she is able to ride her tricycle with less difficulty    Time 7    Period Weeks    Status New    Target Date 05/06/20                 Plan - 05/14/20 1144  Clinical Impression Statement cotninued working today to calm down stiffness in the bil hip flexors and lumbar paraspinals bil. focused remainder of session on hip/ core strengthening in both supine and quadruped lying over red physioball. She was able to complete all exercises but conitnues to fatigue quickly. pt reports having some increaed soreness end of session.    PT Treatment/Interventions ADLs/Self Care Home Management;Cryotherapy;Electrical  Stimulation;Iontophoresis 4mg /ml Dexamethasone;Moist Heat;Traction;Ultrasound;Stair training;Gait training;Functional mobility training;Therapeutic exercise;Therapeutic activities;Balance training;Neuromuscular re-education;Patient/family education;Manual techniques;Scar mobilization;Passive range of motion;Dry needling;Energy conservation;Taping;Visual/perceptual remediation/compensation;Joint Manipulations    PT Next Visit Plan reformer when available, Rt hip proprioception, core stretch, how was working on reformer? continued pronte core/ hip strength using physioball.    PT Home Exercise Plan Cervical Isometric holds, Upper trap and levator stretches, Scapular retraction, Standing Hip abd. and ext, squats, lat lunges, heel/toe  [id number]    Consulted and Agree with Plan of Care Patient           Patient will benefit from skilled therapeutic intervention in order to improve the following deficits and impairments:  Decreased activity tolerance, Decreased coordination, Decreased endurance, Decreased range of motion, Impaired flexibility, Improper body mechanics, Postural dysfunction, Pain  Visit Diagnosis: Muscle weakness (generalized)  Unsteadiness on feet  Other muscle spasm  Acute right-sided low back pain without sciatica  Radiculopathy, cervical region     Problem List Patient Active Problem List   Diagnosis Date Noted  . Spondylolisthesis of lumbar region 01/30/2020  . Lumbar radiculopathy 10/15/2019  . Spondylosis of lumbar spine 08/06/2019  . Lumbar facet arthropathy 02/05/2019  . Cervical myelopathy ([place]) 04/02/2018  . Bilateral arm weakness   . Numbness and tingling in both hands   . Paresthesia and pain of both upper extremities   . Trauma   . Post-operative pain   . Uncomplicated asthma   . Benign essential HTN   . Bradycardia   . Steroid-induced hyperglycemia   . 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 of cervical spinal cord ([place]) 03/30/2018  . S/P cervical spinal  fusion 04/20/2016  . Cervical radicular pain 03/22/2016  . Hamstring tightness 12/11/2014  . 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 Pain in joint, upper arm 11/17/2014  . Labyrinthitis 05/15/2012  . Sinusitis acute 02/12/2012  . Well adult exam 06/27/2011  . Rash, skin 06/27/2011  . ADHESIVE CAPSULITIS, LEFT 11/09/2009  . SHOULDER PAIN 09/09/2009  . HAIR LOSS 07/24/2008  . ELEVATED BP 07/24/2008  . CARPAL TUNNEL SYNDROME 03/23/2008  . PARESTHESIA 03/23/2008  . MIGRAINE HEADACHE 08/02/2007   [name] PT, DPT, LAT, ATC  05/14/20  11:47 AM      [facility] [address] [place], [place], [address] Phone: [phone]   Fax:  [phone]  Name: Kelly Wall MRN: [id number] Date of Birth: 04/12/1961

## 2020-05-18 ENCOUNTER — Other Ambulatory Visit: Payer: Self-pay

## 2020-05-18 ENCOUNTER — Ambulatory Visit: Payer: 59 | Admitting: Physical Therapy

## 2020-05-18 DIAGNOSIS — M545 Low back pain, unspecified: Secondary | ICD-10-CM

## 2020-05-18 DIAGNOSIS — M6281 Muscle weakness (generalized): Secondary | ICD-10-CM

## 2020-05-18 DIAGNOSIS — R2681 Unsteadiness on feet: Secondary | ICD-10-CM

## 2020-05-18 DIAGNOSIS — R278 Other lack of coordination: Secondary | ICD-10-CM | POA: Diagnosis not present

## 2020-05-18 DIAGNOSIS — M5412 Radiculopathy, cervical region: Secondary | ICD-10-CM

## 2020-05-18 DIAGNOSIS — M62838 Other muscle spasm: Secondary | ICD-10-CM

## 2020-05-18 NOTE — Therapy (Signed)
[facility] Rehabilitation [facility] [address] [place], [place], [address] Phone: [phone]   Fax:  [phone]  Physical Therapy Treatment  Patient Details  Name: Kelly Wall MRN: [id number] Date of Birth: 05-25-1961 Referring Provider (PT): Coletta Memos, MD   Encounter Date: 05/18/2020   PT End of Session - 05/18/20 1636    Visit Number 19    Number of Visits 21    Date for PT Re-Evaluation 05/28/20    PT Start Time 1635    PT Stop Time 1715    PT Time Calculation (min) 40 min    Activity Tolerance Patient tolerated treatment well    Behavior During Therapy [facility] for tasks assessed/performed           Past Medical History:  Diagnosis Date  . Asthma    child- occ exersie induced now  . Family history of adverse reaction to anesthesia    dad hard to awaken  . Hypertension    no rx  in 5 yrs fish oil helps now  . Paralysis [facility])     Past Surgical History:  Procedure Laterality Date  . ANTERIOR CERVICAL DECOMP/DISCECTOMY FUSION N/A 03/30/2018   Procedure: ANTERIOR CERVICAL DECOMPRESSION/DISCECTOMY FUSION Cervical four-five and Cervical five-six;  Surgeon: Coletta Memos, MD;  Location: [facility] OR;  Service: Neurosurgery;  Laterality: N/A;  . BACK SURGERY    . CERVICAL DISC ARTHROPLASTY N/A 04/20/2016   Procedure: Cervical Six-Seven Artificial Disc Replacement-Cervical;  Surgeon: Tia Alert, MD;  Location: [facility];  Service: Neurosurgery;  Laterality: N/A;    There were no vitals filed for this visit.   Subjective Assessment - 05/18/20 1637    Subjective " I tried doing the stretch and I slipped off the bed. I do feel like my legs are getting stronger"              [facility] PT Assessment - 05/18/20 0001      Assessment   Medical Diagnosis Radiculopathy, cervical region     Referring Provider (PT) Coletta Memos, MD                         [facility] Adult PT Treatment/Exercise - 05/18/20 0001      Pilates    Pilates Reformer arm circles with legs in table top position 2 x 10 with 1 yellow and 1 red    Other Pilates reformer: foot work leg press with 2 red 1 yellow 1 blue 2 x 20 ea.      Knee/Hip Exercises: Stretches   Hip Flexor Stretch Right;2 reps;30 seconds   in standing     Manual Therapy   Manual therapy comments MTPR along bil hip flexor x 2 ea. R lumbar parapsinals x 2    Joint Mobilization grade II-III AP grade bil hip mobs, RLE grade IV LAD and LLE LAD grade IV                    PT Short Term Goals - 04/02/20 1057      PT SHORT TERM GOAL #1   Title Patient will be able to demonstrate proper posture w/o being cued.    Period Weeks    Status Achieved      PT SHORT TERM GOAL #2   Title Patient will report that she was able to find comfortable stretching positions that do not put a lot of stress on her back    Period Weeks  Status Achieved      PT SHORT TERM GOAL #3   Title Patient will be able to increase her 6 min walk test distance by 50 ft    Baseline 589 ft - 03/19/2020    Period Weeks    Status Unable to assess             PT Long Term Goals - 03/19/20 1252      PT LONG TERM GOAL #1   Title Patient will report that she rarely drops objects due to muscle weakness in order to decrease the amount of bending and lifting that she has to perform    Baseline Pt. reports that muscle weakness causes her to drop objects    Time 10    Period Weeks    Status New    Target Date 05/06/20      PT LONG TERM GOAL #2   Title Patient will comfortably achieve 60 degrees of cervical rotation in order to decrease the amount of upper body twisting when driving.    Baseline Rt. 60/Lt. 56    Time 10    Period Weeks    Status New    Target Date 05/06/20      PT LONG TERM GOAL #3   Title Patient will be able to score >/= a 50 on her BERG balance test in order to reduce unsteadiness and antalgic gait when walking    Baseline 43 BERG score - 03/19/2020    Time 7     Period Weeks    Status New    Target Date 05/06/20      PT LONG TERM GOAL #4   Title Patient will be able to safely bend over and pick up object from the floor with no assistance so that she can perform household activities and chores.    Baseline Patient his unable to pick up objects from the floor without assistance.    Time 7    Period Weeks    Target Date 05/06/20      PT LONG TERM GOAL #5   Title Patient will report a max 5/10 LBP so that she is able to ride her tricycle with less difficulty    Time 7    Period Weeks    Status New    Target Date 05/06/20                 Plan - 05/18/20 1736    Clinical Impression Statement Kelly Wall reports continued soreness/ stiffness inthe low back bil flexors which she states could be due to her returning this week to 8 hours. continued STW for bil hip flexors and L lumbar parapsinals, progressed hip and core strenghtening using the pilats reformer which she did well with.    PT Treatment/Interventions ADLs/Self Care Home Management;Cryotherapy;Electrical Stimulation;Iontophoresis 4mg /ml Dexamethasone;Moist Heat;Traction;Ultrasound;Stair training;Gait training;Functional mobility training;Therapeutic exercise;Therapeutic activities;Balance training;Neuromuscular re-education;Patient/family education;Manual techniques;Scar mobilization;Passive range of motion;Dry needling;Energy conservation;Taping;Visual/perceptual remediation/compensation;Joint Manipulations    PT Next Visit Plan reformer when available, Rt hip proprioception, core stretch, how was working on reformer? continued pronte core/ hip strength using physioball. practice getting up from the floor and stepping through tent    PT Home Exercise Plan Cervical Isometric holds, Upper trap and levator stretches, Scapular retraction, Standing Hip abd. and ext, squats, lat lunges, heel/toe  [id number]    Consulted and Agree with Plan of Care Patient           Patient will benefit from  skilled therapeutic intervention in order  to improve the following deficits and impairments:  Decreased activity tolerance, Decreased coordination, Decreased endurance, Decreased range of motion, Impaired flexibility, Improper body mechanics, Postural dysfunction, Pain  Visit Diagnosis: Muscle weakness (generalized)  Unsteadiness on feet  Other muscle spasm  Acute right-sided low back pain without sciatica  Radiculopathy, cervical region  Other lack of coordination     Problem List Patient Active Problem List   Diagnosis Date Noted  . Spondylolisthesis of lumbar region 01/30/2020  . Lumbar radiculopathy 10/15/2019  . Spondylosis of lumbar spine 08/06/2019  . Lumbar facet arthropathy 02/05/2019  . Cervical myelopathy (HCC) 04/02/2018  . Bilateral arm weakness   . Numbness and tingling in both hands   . Paresthesia and pain of both upper extremities   . Trauma   . Post-operative pain   . Uncomplicated asthma   . Benign essential HTN   . Bradycardia   . Steroid-induced hyperglycemia   . 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 of cervical spinal cord (HCC) 03/30/2018  . S/P cervical spinal fusion 04/20/2016  . Cervical radicular pain 03/22/2016  . Hamstring tightness 12/11/2014  . 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 Pain in joint, upper arm 11/17/2014  . Labyrinthitis 05/15/2012  . Sinusitis acute 02/12/2012  . Well adult exam 06/27/2011  . Rash, skin 06/27/2011  . ADHESIVE CAPSULITIS, LEFT 11/09/2009  . SHOULDER PAIN 09/09/2009  . HAIR LOSS 07/24/2008  . ELEVATED BP 07/24/2008  . CARPAL TUNNEL SYNDROME 03/23/2008  . PARESTHESIA 03/23/2008  . MIGRAINE HEADACHE 08/02/2007    Lulu Riding PT, DPT, LAT, ATC  05/18/20  5:39 PM      [facility] [address] [place], [place], [address] Phone: [phone]   Fax:  [phone]  Name: Kelly Wall MRN: [id number] Date of Birth: July 16, 1961

## 2020-05-20 ENCOUNTER — Other Ambulatory Visit: Payer: Self-pay

## 2020-05-20 ENCOUNTER — Ambulatory Visit: Payer: 59 | Admitting: Physical Therapy

## 2020-05-20 DIAGNOSIS — M6281 Muscle weakness (generalized): Secondary | ICD-10-CM

## 2020-05-20 DIAGNOSIS — M5412 Radiculopathy, cervical region: Secondary | ICD-10-CM | POA: Diagnosis not present

## 2020-05-20 DIAGNOSIS — M545 Low back pain, unspecified: Secondary | ICD-10-CM

## 2020-05-20 DIAGNOSIS — R2681 Unsteadiness on feet: Secondary | ICD-10-CM

## 2020-05-20 DIAGNOSIS — M62838 Other muscle spasm: Secondary | ICD-10-CM

## 2020-05-20 DIAGNOSIS — R278 Other lack of coordination: Secondary | ICD-10-CM | POA: Diagnosis not present

## 2020-05-20 NOTE — Therapy (Signed)
[facility] Outpatient Rehabilitation [facility] [address] [place], [place], [address] Phone: [phone]   Fax:  [phone]  Physical Therapy Treatment  Patient Details  Name: Kelly Wall MRN: [id number] Date of Birth: 25-Aug-1961 Referring Provider (PT): Coletta Memos, MD   Encounter Date: 05/20/2020   PT End of Session - 05/20/20 1505    Visit Number 20    Number of Visits 21    Date for PT Re-Evaluation 05/28/20    PT Start Time 1505    PT Stop Time 1543    PT Time Calculation (min) 38 min    Activity Tolerance Patient tolerated treatment well    Behavior During Therapy [facility] for tasks assessed/performed           Past Medical History:  Diagnosis Date  . Asthma    child- occ exersie induced now  . Family history of adverse reaction to anesthesia    dad hard to awaken  . Hypertension    no rx  in 5 yrs fish oil helps now  . Paralysis [facility])     Past Surgical History:  Procedure Laterality Date  . ANTERIOR CERVICAL DECOMP/DISCECTOMY FUSION N/A 03/30/2018   Procedure: ANTERIOR CERVICAL DECOMPRESSION/DISCECTOMY FUSION Cervical four-five and Cervical five-six;  Surgeon: Coletta Memos, MD;  Location: [facility] OR;  Service: Neurosurgery;  Laterality: N/A;  . BACK SURGERY    . CERVICAL DISC ARTHROPLASTY N/A 04/20/2016   Procedure: Cervical Six-Seven Artificial Disc Replacement-Cervical;  Surgeon: Tia Alert, MD;  Location: [facility];  Service: Neurosurgery;  Laterality: N/A;    There were no vitals filed for this visit.       [facility] PT Assessment - 05/20/20 0001      Assessment   Medical Diagnosis Radiculopathy, cervical region     Referring Provider (PT) Coletta Memos, MD                         [facility] Adult PT Treatment/Exercise - 05/20/20 0001      Therapeutic Activites    Therapeutic Activities Other Therapeutic Activities    Other Therapeutic Activities lunging on to the floor and laying flat and getting up from a supine  position and going from kneeling to standing using trekking poles.    verbal and tactile cues and demosntration for proper.     Knee/Hip Exercises: Aerobic   Nustep L7 x 7 min LE only       Manual Therapy   Manual therapy comments MTPR along bil hip flexor x 2 ea. R lumbar parapsinals x 2    Joint Mobilization grade II-III AP grade bil hip mobs, RLE grade IV LAD and LLE LAD grade IV                    PT Short Term Goals - 04/02/20 1057      PT SHORT TERM GOAL #1   Title Patient will be able to demonstrate proper posture w/o being cued.    Period Weeks    Status Achieved      PT SHORT TERM GOAL #2   Title Patient will report that she was able to find comfortable stretching positions that do not put a lot of stress on her back    Period Weeks    Status Achieved      PT SHORT TERM GOAL #3   Title Patient will be able to increase her 6 min walk test distance by 50 ft  Baseline 589 ft - 03/19/2020    Period Weeks    Status Unable to assess             PT Long Term Goals - 03/19/20 1252      PT LONG TERM GOAL #1   Title Patient will report that she rarely drops objects due to muscle weakness in order to decrease the amount of bending and lifting that she has to perform    Baseline Pt. reports that muscle weakness causes her to drop objects    Time 10    Period Weeks    Status New    Target Date 05/06/20      PT LONG TERM GOAL #2   Title Patient will comfortably achieve 60 degrees of cervical rotation in order to decrease the amount of upper body twisting when driving.    Baseline Rt. 60/Lt. 56    Time 10    Period Weeks    Status New    Target Date 05/06/20      PT LONG TERM GOAL #3   Title Patient will be able to score >/= a 50 on her BERG balance test in order to reduce unsteadiness and antalgic gait when walking    Baseline 43 BERG score - 03/19/2020    Time 7    Period Weeks    Status New    Target Date 05/06/20      PT LONG TERM GOAL #4   Title  Patient will be able to safely bend over and pick up object from the floor with no assistance so that she can perform household activities and chores.    Baseline Patient his unable to pick up objects from the floor without assistance.    Time 7    Period Weeks    Target Date 05/06/20      PT LONG TERM GOAL #5   Title Patient will report a max 5/10 LBP so that she is able to ride her tricycle with less difficulty    Time 7    Period Weeks    Status New    Target Date 05/06/20                 Plan - 05/20/20 1544    Clinical Impression Statement continued sTW and LAD for bil hips to calm down soreness. focused session primarily on function kneeling and going form a kneeling to lying position and get back up to standing using trekking poles. She did requirin verbal/ tactile cues and demonstration for proper form, she was able to perform the activity but did requiring increased time. plan to reassess next session update HEP, and discharge nxt session.    PT Treatment/Interventions ADLs/Self Care Home Management;Cryotherapy;Electrical Stimulation;Iontophoresis 4mg /ml Dexamethasone;Moist Heat;Traction;Ultrasound;Stair training;Gait training;Functional mobility training;Therapeutic exercise;Therapeutic activities;Balance training;Neuromuscular re-education;Patient/family education;Manual techniques;Scar mobilization;Passive range of motion;Dry needling;Energy conservation;Taping;Visual/perceptual remediation/compensation;Joint Manipulations    PT Next Visit Plan reformer when available, Rt hip proprioception, core stretch, . practice getting up from the floor and stepping through tent, update HEP for ankle strengthening.    PT Home Exercise Plan Cervical Isometric holds, Upper trap and levator stretches, Scapular retraction, Standing Hip abd. and ext, squats, lat lunges, heel/toe  [id number]           Patient will benefit from skilled therapeutic intervention in order to improve the following  deficits and impairments:  Decreased activity tolerance, Decreased coordination, Decreased endurance, Decreased range of motion, Impaired flexibility, Improper body mechanics, Postural dysfunction, Pain  Visit Diagnosis:  Muscle weakness (generalized)  Unsteadiness on feet  Other muscle spasm  Acute right-sided low back pain without sciatica     Problem List Patient Active Problem List   Diagnosis Date Noted  . Spondylolisthesis of lumbar region 01/30/2020  . Lumbar radiculopathy 10/15/2019  . Spondylosis of lumbar spine 08/06/2019  . Lumbar facet arthropathy 02/05/2019  . Cervical myelopathy (HCC) 04/02/2018  . Bilateral arm weakness   . Numbness and tingling in both hands   . Paresthesia and pain of both upper extremities   . Trauma   . Post-operative pain   . Uncomplicated asthma   . Benign essential HTN   . Bradycardia   . Steroid-induced hyperglycemia   . 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 of cervical spinal cord (HCC) 03/30/2018  . S/P cervical spinal fusion 04/20/2016  . Cervical radicular pain 03/22/2016  . Hamstring tightness 12/11/2014  . 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 Pain in joint, upper arm 11/17/2014  . Labyrinthitis 05/15/2012  . Sinusitis acute 02/12/2012  . Well adult exam 06/27/2011  . Rash, skin 06/27/2011  . ADHESIVE CAPSULITIS, LEFT 11/09/2009  . SHOULDER PAIN 09/09/2009  . HAIR LOSS 07/24/2008  . ELEVATED BP 07/24/2008  . CARPAL TUNNEL SYNDROME 03/23/2008  . PARESTHESIA 03/23/2008  . MIGRAINE HEADACHE 08/02/2007   Lulu Riding PT, DPT, LAT, ATC  05/20/20  3:47 PM      [facility] [address] [place], [place], [address] Phone: [phone]   Fax:  [phone]  Name: YADHIRA MCKNEELY MRN: [id number] Date of Birth: [birth year]

## 2020-05-25 ENCOUNTER — Ambulatory Visit: Payer: 59 | Admitting: Physical Therapy

## 2020-05-25 ENCOUNTER — Other Ambulatory Visit: Payer: Self-pay

## 2020-05-25 DIAGNOSIS — R2681 Unsteadiness on feet: Secondary | ICD-10-CM | POA: Diagnosis not present

## 2020-05-25 DIAGNOSIS — M545 Low back pain, unspecified: Secondary | ICD-10-CM

## 2020-05-25 DIAGNOSIS — M5412 Radiculopathy, cervical region: Secondary | ICD-10-CM | POA: Diagnosis not present

## 2020-05-25 DIAGNOSIS — M62838 Other muscle spasm: Secondary | ICD-10-CM

## 2020-05-25 DIAGNOSIS — M6281 Muscle weakness (generalized): Secondary | ICD-10-CM | POA: Diagnosis not present

## 2020-05-25 DIAGNOSIS — R278 Other lack of coordination: Secondary | ICD-10-CM | POA: Diagnosis not present

## 2020-05-25 NOTE — Therapy (Signed)
[place], [place], [address] Phone: [phone]   Fax:  [phone]  Physical Therapy Treatment/ discharge  Patient Details  Name: Kelly Wall MRN: [id number] Date of Birth: May 22, 1961 Referring Provider (PT): Ashok Pall, MD   Encounter Date: 05/25/2020   PT End of Session - 05/25/20 1633    Visit Number 21    Number of Visits 21    Date for PT Re-Evaluation 05/28/20    PT Start Time 1635    PT Stop Time 1715    PT Time Calculation (min) 40 min    Equipment Utilized During Treatment Gait belt    Activity Tolerance Patient tolerated treatment well    Behavior During Therapy [facility] for tasks assessed/performed           Past Medical History:  Diagnosis Date  . Asthma    child- occ exersie induced now  . Family history of adverse reaction to anesthesia    dad hard to awaken  . Hypertension    no rx  in 5 yrs fish oil helps now  . Paralysis [facility])     Past Surgical History:  Procedure Laterality Date  . ANTERIOR CERVICAL DECOMP/DISCECTOMY FUSION N/A 03/30/2018   Procedure: ANTERIOR CERVICAL DECOMPRESSION/DISCECTOMY FUSION Cervical four-five and Cervical five-six;  Surgeon: Ashok Pall, MD;  Location: [place];  Service: Neurosurgery;  Laterality: N/A;  . BACK SURGERY    . CERVICAL DISC ARTHROPLASTY N/A 04/20/2016   Procedure: Cervical Six-Seven Artificial Disc Replacement-Cervical;  Surgeon: Eustace Moore, MD;  Location: [place] [facility];  Service: Neurosurgery;  Laterality: N/A;    There were no vitals filed for this visit.       [facility] PT Assessment - 05/25/20 0001      Assessment   Medical Diagnosis Radiculopathy, cervical region     Referring Provider (PT) Ashok Pall, MD      Observation/Other Assessments   Focus on Therapeutic Outcomes (FOTO)  59% limited      AROM   Cervical - Right Rotation 58    Cervical - Left Rotation 58      6 minute walk test results    Aerobic  Endurance Distance Walked 600      Berg Balance Test   Sit to Stand Able to stand without using hands and stabilize independently    Standing Unsupported Able to stand safely 2 minutes    Sitting with Back Unsupported but Feet Supported on Floor or Stool Able to sit safely and securely 2 minutes    Stand to Sit Sits safely with minimal use of hands    Transfers Able to transfer safely, minor use of hands    Standing Unsupported with Eyes Closed Able to stand 10 seconds safely    Standing Unsupported with Feet Together Able to place feet together independently and stand 1 minute safely    From Standing, Reach Forward with Outstretched Arm Can reach forward >12 cm safely (5")    From Standing Position, Pick up Object from Floor Unable to pick up and needs supervision    From Standing Position, Turn to Look Behind Over each Shoulder Looks behind from both sides and weight shifts well    Turn 360 Degrees Able to turn 360 degrees safely but slowly    Standing Unsupported, Alternately Place Feet on Step/Stool Able to stand independently and complete 8 steps >20 seconds    Standing Unsupported, One Foot in Front Able to plae foot ahead of  the other independently and hold 30 seconds    Standing on One Leg Able to lift leg independently and hold > 10 seconds    Total Score 48                                 PT Education - 05/25/20 1717    Education Details Reviewed HEP and discussed appropriate progression of strengtheining and to give appropriate rest to reduce doing too much.    Person(s) Educated Patient    Methods Explanation;Verbal cues;Handout    Comprehension Verbalized understanding;Verbal cues required            PT Short Term Goals - 05/25/20 1642      PT SHORT TERM GOAL #1   Title Patient will be able to demonstrate proper posture w/o being cued.    Period Weeks    Status Achieved      PT SHORT TERM GOAL #2   Title Patient will report that she was able  to find comfortable stretching positions that do not put a lot of stress on her back      PT SHORT TERM GOAL #3   Title Patient will be able to increase her 6 min walk test distance by 50 ft    Period Weeks    Status Not Met      PT SHORT TERM GOAL #4   Title understand correct toileting technique to push stool out with greater ease             PT Long Term Goals - 05/25/20 1642      PT LONG TERM GOAL #1   Title Patient will report that she rarely drops objects due to muscle weakness in order to decrease the amount of bending and lifting that she has to perform    Period Weeks    Status Not Met      PT LONG TERM GOAL #2   Title Patient will comfortably achieve 60 degrees of cervical rotation in order to decrease the amount of upper body twisting when driving.    Period Weeks    Status Partially Met      PT LONG TERM GOAL #3   Title Patient will be able to score >/= a 50 on her BERG balance test in order to reduce unsteadiness and antalgic gait when walking    Baseline 48 on 05/25/2020    Period Weeks    Status Partially Met      PT LONG TERM GOAL #4   Title Patient will be able to safely bend over and pick up object from the floor with no assistance so that she can perform household activities and chores.    Period Weeks    Status Not Met      PT LONG TERM GOAL #5   Title Patient will report a max 5/10 LBP so that she is able to ride her tricycle with less difficulty    Period Weeks    Status Achieved                 Plan - 05/25/20 1721    Clinical Impression Statement Kelly Wall continues to report fluctuating stiffness/ soreness in the R hip/ low back which she notes has increased as she has increased her hours at work, but overall flucutates in frequency / severity.  Per pt report she continues to do alot at home which she does report increased  soreness with requiring mutliple measure to help control. she increased her berg balance to 48/56, she amb 600 ft in 6 min  with trekking poles. She met or partially met all goals except for LTG #1 and #3. At this point pt is doing more at home then she is able to do at PT and is able to maintain and progress her current level of function independently and will be discharged from PT today.    PT Treatment/Interventions ADLs/Self Care Home Management;Cryotherapy;Electrical Stimulation;Iontophoresis 3m/ml Dexamethasone;Moist Heat;Traction;Ultrasound;Stair training;Gait training;Functional mobility training;Therapeutic exercise;Therapeutic activities;Balance training;Neuromuscular re-education;Patient/family education;Manual techniques;Scar mobilization;Passive range of motion;Dry needling;Energy conservation;Taping;Visual/perceptual remediation/compensation;Joint Manipulations    PT Next Visit Plan D/C    PT Home Exercise Plan Cervical Isometric holds, Upper trap and levator stretches, Scapular retraction, Standing Hip abd. and ext, squats, lat lunges, heel/toe  [id number]           Patient will benefit from skilled therapeutic intervention in order to improve the following deficits and impairments:  Decreased activity tolerance, Decreased coordination, Decreased endurance, Decreased range of motion, Impaired flexibility, Improper body mechanics, Postural dysfunction, Pain  Visit Diagnosis: Muscle weakness (generalized)  Unsteadiness on feet  Other muscle spasm  Acute right-sided low back pain without sciatica     Problem List Patient Active Problem List   Diagnosis Date Noted  . Spondylolisthesis of lumbar region 01/30/2020  . Lumbar radiculopathy 10/15/2019  . Spondylosis of lumbar spine 08/06/2019  . Lumbar facet arthropathy 02/05/2019  . Cervical myelopathy ([facility] 04/02/2018  . Bilateral arm weakness   . Numbness and tingling in both hands   . Paresthesia and pain of both upper extremities   . Trauma   . Post-operative pain   . Uncomplicated asthma   . Benign essential HTN   . Bradycardia   .  Steroid-induced hyperglycemia   . 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 of cervical spinal cord ([facility] 03/30/2018  . S/P cervical spinal fusion 04/20/2016  . Cervical radicular pain 03/22/2016  . Hamstring tightness 12/11/2014  . 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 Pain in joint, upper arm 11/17/2014  . Labyrinthitis 05/15/2012  . Sinusitis acute 02/12/2012  . Well adult exam 06/27/2011  . Rash, skin 06/27/2011  . ADHESIVE CAPSULITIS, LEFT 11/09/2009  . SHOULDER PAIN 09/09/2009  . HAIR LOSS 07/24/2008  . ELEVATED BP 07/24/2008  . CARPAL TUNNEL SYNDROME 03/23/2008  . PARESTHESIA 03/23/2008  . MIGRAINE HEADACHE 08/02/2007    LStarr Lake6/29/2021, 5:31 PM  [facility] [address]Phone: [phone]  Fax:  [phone] Name: RREGENIA ERCKMRN: [id number] of Birth: 107-30-1962     PHYSICAL THERAPY DISCHARGE SUMMARY  Visits from Start of Care: 21  Current functional level related to goals / functional outcomes: See goals, FOTO 59% limited   Remaining deficits: See assessment in note   Education / Equipment: HEP, theraband, posture, lifting mechanics, proper transitions  From sit<> stand, and getting up from the floor. Gait with trekking poles  Plan: Patient agrees to discharge.  Patient goals were partially met. Patient is being discharged due to being pleased with the current functional level.  ?????         Dericka Ostenson PT, DPT, LAT, ATC  05/25/20  5:32 PM

## 2020-05-28 ENCOUNTER — Ambulatory Visit: Payer: 59 | Admitting: Physical Therapy

## 2020-06-01 ENCOUNTER — Ambulatory Visit: Payer: 59 | Admitting: Physical Therapy

## 2020-06-03 ENCOUNTER — Ambulatory Visit: Payer: 59 | Admitting: Physical Therapy

## 2020-06-08 ENCOUNTER — Encounter: Payer: 59 | Admitting: Physical Therapy

## 2020-06-10 ENCOUNTER — Encounter: Payer: 59 | Admitting: Physical Therapy

## 2020-06-11 MED FILL — PREGABALIN 100 MG CAPS: 100 | 30 days supply | Qty: 60 | Fill #4

## 2020-07-07 ENCOUNTER — Encounter: Payer: Self-pay | Admitting: Physical Medicine & Rehabilitation

## 2020-07-07 ENCOUNTER — Other Ambulatory Visit: Payer: Self-pay

## 2020-07-07 ENCOUNTER — Encounter: Payer: 59 | Attending: Physical Medicine & Rehabilitation | Admitting: Physical Medicine & Rehabilitation

## 2020-07-07 VITALS — BP 122/73 | HR 71 | Temp 98.1°F | Ht 61.0 in | Wt 114.0 lb

## 2020-07-07 DIAGNOSIS — M4316 Spondylolisthesis, lumbar region: Secondary | ICD-10-CM | POA: Diagnosis not present

## 2020-07-07 DIAGNOSIS — N318 Other neuromuscular dysfunction of bladder: Secondary | ICD-10-CM | POA: Diagnosis not present

## 2020-07-07 DIAGNOSIS — K592 Neurogenic bowel, not elsewhere classified: Secondary | ICD-10-CM

## 2020-07-07 DIAGNOSIS — M5416 Radiculopathy, lumbar region: Secondary | ICD-10-CM | POA: Insufficient documentation

## 2020-07-07 DIAGNOSIS — S14125S 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 of cervical spinal cord, sequela: Secondary | ICD-10-CM | POA: Diagnosis not present

## 2020-07-07 NOTE — Patient Instructions (Addendum)
Bowels   -recommend AM suppository before or just after she tries to empty bowels  Bladder-double void, bladder massage.   -consider medication to help bladder empty
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
## 2020-07-09 ENCOUNTER — Other Ambulatory Visit: Payer: Self-pay | Admitting: *Deleted

## 2020-07-09 DIAGNOSIS — M79601 Pain in right arm: Secondary | ICD-10-CM

## 2020-07-09 DIAGNOSIS — R202 Paresthesia of skin: Secondary | ICD-10-CM

## 2020-07-09 DIAGNOSIS — R209 Unspecified disturbances of skin sensation: Secondary | ICD-10-CM

## 2020-07-09 DIAGNOSIS — S14125S 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 of cervical spinal cord, sequela: Secondary | ICD-10-CM

## 2020-07-09 MED ORDER — PREGABALIN 100 MG PO CAPS: ORAL_CAPSULE | ORAL | 4 refills | Status: DC

## 2020-07-09 MED FILL — PREGABALIN 100 MG CAPS: 100 | 30 days supply | Qty: 60 | Fill #0

## 2020-08-06 ENCOUNTER — Other Ambulatory Visit (HOSPITAL_COMMUNITY): Payer: Self-pay | Admitting: Dermatology

## 2020-08-06 MED FILL — PREGABALIN 100 MG CAPS: 100 | 30 days supply | Qty: 60 | Fill #1

## 2020-08-06 MED FILL — CLINDAMYCIN-BENZOYL PEROX G: 1-5 | 30 days supply | Qty: 50 | Fill #0

## 2020-08-23 DIAGNOSIS — Z1231 Encounter for screening mammogram for malignant neoplasm of breast: Secondary | ICD-10-CM | POA: Diagnosis not present

## 2020-08-23 DIAGNOSIS — Z8041 Family history of malignant neoplasm of ovary: Secondary | ICD-10-CM | POA: Diagnosis not present

## 2020-08-23 DIAGNOSIS — Z6821 Body mass index (BMI) 21.0-21.9, adult: Secondary | ICD-10-CM | POA: Diagnosis not present

## 2020-08-23 DIAGNOSIS — Z8489 Family history of other specified conditions: Secondary | ICD-10-CM | POA: Insufficient documentation

## 2020-08-23 DIAGNOSIS — Z1211 Encounter for screening for malignant neoplasm of colon: Secondary | ICD-10-CM | POA: Diagnosis not present

## 2020-08-23 DIAGNOSIS — Z01419 Encounter for gynecological examination (general) (routine) without abnormal findings: Secondary | ICD-10-CM | POA: Diagnosis not present

## 2020-08-23 DIAGNOSIS — Z0000 Encounter for general adult medical examination without abnormal findings: Secondary | ICD-10-CM | POA: Diagnosis not present

## 2020-08-24 DIAGNOSIS — M48062 Spinal stenosis, lumbar region with neurogenic claudication: Secondary | ICD-10-CM | POA: Diagnosis not present

## 2020-08-24 DIAGNOSIS — Z6820 Body mass index (BMI) 20.0-20.9, adult: Secondary | ICD-10-CM | POA: Diagnosis not present

## 2020-08-27 DIAGNOSIS — Z0000 Encounter for general adult medical examination without abnormal findings: Secondary | ICD-10-CM | POA: Diagnosis not present

## 2020-08-27 DIAGNOSIS — Z842 Family history of other diseases of the genitourinary system: Secondary | ICD-10-CM | POA: Diagnosis not present

## 2020-09-06 MED FILL — PREGABALIN 100 MG CAPS: 100 | 30 days supply | Qty: 60 | Fill #2

## 2020-09-10 DIAGNOSIS — Z803 Family history of malignant neoplasm of breast: Secondary | ICD-10-CM | POA: Diagnosis not present

## 2020-09-10 DIAGNOSIS — Z0000 Encounter for general adult medical examination without abnormal findings: Secondary | ICD-10-CM | POA: Diagnosis not present

## 2020-09-24 MED FILL — TRETINOIN 0.025% CREAM: 0.025 | 90 days supply | Qty: 45 | Fill #1

## 2020-09-30 DIAGNOSIS — G9520 Unspecified cord compression: Secondary | ICD-10-CM | POA: Diagnosis not present

## 2020-10-04 ENCOUNTER — Other Ambulatory Visit (HOSPITAL_COMMUNITY): Payer: Self-pay | Admitting: Physical Medicine & Rehabilitation

## 2020-10-04 ENCOUNTER — Telehealth (HOSPITAL_COMMUNITY): Payer: Self-pay | Admitting: Physical Medicine & Rehabilitation

## 2020-10-04 DIAGNOSIS — R209 Unspecified disturbances of skin sensation: Secondary | ICD-10-CM

## 2020-10-04 DIAGNOSIS — S14125S 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 of cervical spinal cord, sequela: Secondary | ICD-10-CM

## 2020-10-04 DIAGNOSIS — M79601 Pain in right arm: Secondary | ICD-10-CM

## 2020-10-04 MED ORDER — PREGABALIN 100 MG PO CAPS: ORAL_CAPSULE | ORAL | 3 refills | Status: DC

## 2020-10-04 MED FILL — PREGABALIN 100 MG CAPS: 100 | 90 days supply | Qty: 180 | Fill #0

## 2020-10-04 NOTE — Telephone Encounter (Signed)
90 day lyrica rx written

## 2020-10-07 ENCOUNTER — Other Ambulatory Visit (HOSPITAL_COMMUNITY): Payer: Self-pay | Admitting: Physical Medicine & Rehabilitation

## 2020-10-07 ENCOUNTER — Other Ambulatory Visit: Payer: Self-pay | Admitting: Physical Medicine & Rehabilitation

## 2020-10-07 DIAGNOSIS — S14125S 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 of cervical spinal cord, sequela: Secondary | ICD-10-CM

## 2020-10-07 MED FILL — TRETINOIN 0.025% CREAM: 0.025 | 90 days supply | Qty: 45 | Fill #2

## 2020-10-07 MED FILL — CYCLOBENZAPRINE HCL 5 MG TA: 5 | 20 days supply | Qty: 60 | Fill #0

## 2020-10-07 MED FILL — CLINDAMYCIN PHOS-BENZOYL PE: 1-5 | 30 days supply | Qty: 50 | Fill #1

## 2020-11-10 ENCOUNTER — Other Ambulatory Visit: Payer: Self-pay

## 2020-11-10 ENCOUNTER — Encounter: Payer: 59 | Attending: Physical Medicine & Rehabilitation | Admitting: Physical Medicine & Rehabilitation

## 2020-11-10 ENCOUNTER — Other Ambulatory Visit (HOSPITAL_COMMUNITY): Payer: Self-pay | Admitting: Physical Medicine & Rehabilitation

## 2020-11-10 ENCOUNTER — Encounter: Payer: Self-pay | Admitting: Physical Medicine & Rehabilitation

## 2020-11-10 VITALS — BP 134/85 | HR 64 | Temp 97.8°F | Ht 61.0 in | Wt 116.8 lb

## 2020-11-10 DIAGNOSIS — M792 Neuralgia and neuritis, unspecified: Secondary | ICD-10-CM | POA: Insufficient documentation

## 2020-11-10 DIAGNOSIS — S14125S 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 of cervical spinal cord, sequela: Secondary | ICD-10-CM

## 2020-11-10 DIAGNOSIS — M5416 Radiculopathy, lumbar region: Secondary | ICD-10-CM | POA: Diagnosis not present

## 2020-11-10 MED ORDER — PREGABALIN 75 MG PO CAPS: 75.0000 mg | ORAL_CAPSULE | Freq: Two times a day (BID) | ORAL | 2 refills | Status: DC

## 2020-11-10 MED FILL — PREGABALIN 75 MG CAPS: 75 | 30 days supply | Qty: 60 | Fill #0
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
## 2020-11-10 NOTE — Patient Instructions (Addendum)
TRY LYRICA 100MG  AT NIGHT AND 75MG   IN THE MORNING FOR 2 WEEKS. IF NO PROBLEMS, DECREASE TO 75MG  TWICE DAILY.    [facility]   Social services organization in [place],  Address: [address] [place], [place]   Phone: [phone]      PLEASE FEEL FREE TO CALL OUR OFFICE WITH ANY PROBLEMS OR QUESTIONS [phone])                                @                 @@               @@@                        @@@@                      @@@@@         @@@@@@                  [phone]                [phone]              [phone]             @@@@@@@       IIII                  IIII                                                        HAPPY HOLIDAYS!!!!!

## 2020-11-30 DIAGNOSIS — G9520 Unspecified cord compression: Secondary | ICD-10-CM | POA: Diagnosis not present

## 2020-12-20 ENCOUNTER — Telehealth: Payer: Self-pay | Admitting: Internal Medicine

## 2020-12-20 NOTE — Telephone Encounter (Signed)
OK. Thx

## 2020-12-20 NOTE — Telephone Encounter (Signed)
Patient is requesting to re establish care. Please advise  

## 2020-12-21 NOTE — Telephone Encounter (Signed)
Called patient and LVM to call office and set up appointment.

## 2020-12-29 ENCOUNTER — Telehealth: Payer: Self-pay | Admitting: *Deleted

## 2020-12-29 ENCOUNTER — Ambulatory Visit (INDEPENDENT_AMBULATORY_CARE_PROVIDER_SITE_OTHER): Payer: 59 | Admitting: Internal Medicine

## 2020-12-29 ENCOUNTER — Other Ambulatory Visit: Payer: Self-pay

## 2020-12-29 ENCOUNTER — Encounter: Payer: Self-pay | Admitting: Internal Medicine

## 2020-12-29 DIAGNOSIS — I10 Essential (primary) hypertension: Secondary | ICD-10-CM | POA: Diagnosis not present

## 2020-12-29 DIAGNOSIS — L819 Disorder of pigmentation, unspecified: Secondary | ICD-10-CM

## 2020-12-29 DIAGNOSIS — S14125S 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 of cervical spinal cord, sequela: Secondary | ICD-10-CM | POA: Diagnosis not present

## 2020-12-29 DIAGNOSIS — G959 Disease of spinal cord, unspecified: Secondary | ICD-10-CM

## 2020-12-29 DIAGNOSIS — D649 Anemia, unspecified: Secondary | ICD-10-CM | POA: Insufficient documentation

## 2020-12-29 DIAGNOSIS — M792 Neuralgia and neuritis, unspecified: Secondary | ICD-10-CM | POA: Diagnosis not present

## 2020-12-29 DIAGNOSIS — M5416 Radiculopathy, lumbar region: Secondary | ICD-10-CM | POA: Diagnosis not present

## 2020-12-29 DIAGNOSIS — Z981 Arthrodesis status: Secondary | ICD-10-CM | POA: Diagnosis not present

## 2020-12-29 NOTE — Telephone Encounter (Signed)
-----   Message from Tresa Garter, MD sent at 12/29/2020  9:01 AM EST ----- Kelly Wall, Can we see her today? Thanks, AP

## 2020-12-29 NOTE — Assessment & Plan Note (Addendum)
On Lyrica at present.  It makes her sleepy And lions mane mushroom Try gentle chair yoga on Zoom

## 2020-12-29 NOTE — Patient Instructions (Signed)
   B-complex with Niacin 100 mg    Lion's mane  

## 2020-12-29 NOTE — Assessment & Plan Note (Signed)
post-op CBC, iron
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
## 2020-12-29 NOTE — Telephone Encounter (Signed)
She asked to move it up. I guess she changed her mind. Thx

## 2020-12-29 NOTE — Telephone Encounter (Signed)
Pt coming this afternoon at 4:00....Kelly Wall

## 2020-12-29 NOTE — Telephone Encounter (Signed)
Pt has made appt for 2/9 @ 1:40 instead of today.Marland KitchenRaechel Chute

## 2020-12-30 ENCOUNTER — Other Ambulatory Visit (INDEPENDENT_AMBULATORY_CARE_PROVIDER_SITE_OTHER): Payer: 59

## 2020-12-30 DIAGNOSIS — D649 Anemia, unspecified: Secondary | ICD-10-CM | POA: Diagnosis not present

## 2020-12-30 DIAGNOSIS — M792 Neuralgia and neuritis, unspecified: Secondary | ICD-10-CM | POA: Diagnosis not present

## 2020-12-30 LAB — URINALYSIS, ROUTINE W REFLEX MICROSCOPIC
Bilirubin Urine: NEGATIVE
Ketones, ur: NEGATIVE
Nitrite: NEGATIVE
RBC / HPF: NONE SEEN (ref 0–?)
Specific Gravity, Urine: <=1.005 — AB (ref 1.000–1.030)
Total Protein, Urine: NEGATIVE
Urine Glucose: NEGATIVE
Urobilinogen, UA: 0.2 (ref 0.0–1.0)
pH: 6.5 (ref 5.0–8.0)

## 2020-12-30 LAB — LIPID PANEL
Cholesterol: 190 mg/dL (ref 0–200)
HDL: 59.7 mg/dL (ref >39.00)
LDL Cholesterol: 124 mg/dL — ABNORMAL HIGH (ref 0–99)
NonHDL: 130.69
Total CHOL/HDL Ratio: 3
Triglycerides: 33 mg/dL (ref 0.0–149.0)
VLDL: 6.6 mg/dL (ref 0.0–40.0)

## 2020-12-30 LAB — CBC WITH DIFFERENTIAL/PLATELET
Basophils Absolute: 0.1 10*3/uL (ref 0.0–0.1)
Basophils Relative: 0.8 % (ref 0.0–3.0)
Eosinophils Absolute: 0.2 10*3/uL (ref 0.0–0.7)
Eosinophils Relative: 2.3 % (ref 0.0–5.0)
HCT: 40.2 % (ref 36.0–46.0)
Hemoglobin: 13.9 g/dL (ref 12.0–15.0)
Lymphocytes Relative: 26 % (ref 12.0–46.0)
Lymphs Abs: 1.8 10*3/uL (ref 0.7–4.0)
MCHC: 34.6 g/dL (ref 30.0–36.0)
MCV: 91.6 fl (ref 78.0–100.0)
Monocytes Absolute: 0.5 10*3/uL (ref 0.1–1.0)
Monocytes Relative: 7.4 % (ref 3.0–12.0)
Neutro Abs: 4.4 10*3/uL (ref 1.4–7.7)
Neutrophils Relative %: 63.5 % (ref 43.0–77.0)
Platelets: 329 10*3/uL (ref 150.0–400.0)
RBC: 4.39 Mil/uL (ref 3.87–5.11)
RDW: 13 % (ref 11.5–15.5)
WBC: 7 10*3/uL (ref 4.0–10.5)

## 2020-12-30 LAB — VITAMIN B12: Vitamin B-12: 1345 pg/mL — ABNORMAL HIGH (ref 211–911)

## 2020-12-30 LAB — SEDIMENTATION RATE: Sed Rate: 7 mm/hr (ref 0–30)

## 2020-12-30 LAB — T4, FREE: Free T4: 1.05 ng/dL (ref 0.60–1.60)

## 2020-12-30 LAB — TSH: TSH: 1.21 u[IU]/mL (ref 0.35–4.50)

## 2020-12-30 LAB — CORTISOL: Cortisol, Plasma: 4.4 ug/dL

## 2020-12-30 LAB — VITAMIN D 25 HYDROXY (VIT D DEFICIENCY, FRACTURES): VITD: 75.26 ng/mL (ref 30.00–100.00)

## 2020-12-31 LAB — IRON,TIBC AND FERRITIN PANEL
%SAT: 16 % (calc) (ref 16–45)
Ferritin: 43 ng/mL (ref 16–232)
Iron: 64 ug/dL (ref 45–160)
TIBC: 402 mcg/dL (calc) (ref 250–450)

## 2021-01-02 ENCOUNTER — Encounter: Payer: Self-pay | Admitting: Internal Medicine

## 2021-01-02 DIAGNOSIS — L819 Disorder of pigmentation, unspecified: Secondary | ICD-10-CM | POA: Insufficient documentation

## 2021-01-02 NOTE — Assessment & Plan Note (Signed)
  She is also status post LS spine fusion 2021 by Dr Franky Macho. She is seeing Dr. Riley Kill (PMR)

## 2021-01-02 NOTE — Assessment & Plan Note (Signed)
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 She can try lions mane mushroom and niacin (low-dose).  She seems to have good tissue perfusion however.  We will continue to monitor. Obtain lab work including vitamin B12, vitamin D level, CBC, chemistry

## 2021-01-02 NOTE — Assessment & Plan Note (Signed)
Blood pressure seems to remain normal

## 2021-01-02 NOTE — Assessment & Plan Note (Signed)
She is using Lyrica for residual pain and numbness.  She is exercising at home
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
## 2021-01-05 ENCOUNTER — Encounter: Payer: 59 | Admitting: Internal Medicine

## 2021-01-11 ENCOUNTER — Telehealth: Payer: Self-pay | Admitting: *Deleted

## 2021-01-11 ENCOUNTER — Other Ambulatory Visit (HOSPITAL_COMMUNITY): Payer: Self-pay | Admitting: Physical Medicine & Rehabilitation

## 2021-01-11 DIAGNOSIS — M792 Neuralgia and neuritis, unspecified: Secondary | ICD-10-CM

## 2021-01-11 MED ORDER — PREGABALIN 75 MG PO CAPS: 75.0000 mg | ORAL_CAPSULE | Freq: Two times a day (BID) | ORAL | 2 refills | Status: DC

## 2021-01-11 MED FILL — PREGABALIN 75 MG CAPS: 75 | 30 days supply | Qty: 60 | Fill #0

## 2021-01-11 NOTE — Telephone Encounter (Signed)
Kelly Wall called for a refill on her pregabalin 75 mg bid. Refill called to pharmacy. She states she had been taking the 100 mg in the morning and 75 mg at bedtime but wants to take the 75 bid.

## 2021-01-12 MED FILL — TRETINOIN 0.025% CREAM: 0.025 | 30 days supply | Qty: 45 | Fill #3

## 2021-01-12 MED FILL — CLINDAMYCIN PHOS-BENZOYL PE: 1-5 | 30 days supply | Qty: 50 | Fill #2

## 2021-01-12 MED FILL — CYCLOBENZAPRINE HCL 5 MG TA: 5 | 20 days supply | Qty: 60 | Fill #1

## 2021-01-20 ENCOUNTER — Other Ambulatory Visit (HOSPITAL_COMMUNITY): Payer: Self-pay | Admitting: Physical Medicine & Rehabilitation

## 2021-01-20 ENCOUNTER — Other Ambulatory Visit: Payer: Self-pay | Admitting: *Deleted

## 2021-01-20 DIAGNOSIS — M792 Neuralgia and neuritis, unspecified: Secondary | ICD-10-CM

## 2021-01-20 MED ORDER — PREGABALIN 75 MG PO CAPS: 75.0000 mg | ORAL_CAPSULE | Freq: Two times a day (BID) | ORAL | 0 refills | Status: DC

## 2021-01-24 ENCOUNTER — Telehealth: Payer: Self-pay

## 2021-01-24 NOTE — Telephone Encounter (Signed)
90 supply of Lyrica was sent in by Sybil on last Friday . Because [employer] will end today. And out of pocket cost is $500.00 without insurance.   However [name] will not, be able to pick- up Lyrica  today. It's 17 day's  too early for release, per pharmacy.   Patient has been notified and advised to try Good-Rx & coupons.

## 2021-03-16 ENCOUNTER — Telehealth: Payer: Self-pay | Admitting: *Deleted

## 2021-03-16 NOTE — Telephone Encounter (Signed)
Kelly Wall called to inquire if some foundation (couldn't understand the name) has sent papers for patient assistance to receive her lyrica since she has lost her insurance.

## 2021-03-17 NOTE — Telephone Encounter (Signed)
Contacted patient and left a voicemail that the forms were found.  I advised patient to come into office with required documentation so she could complete her part of the form.

## 2021-03-17 NOTE — Telephone Encounter (Signed)
Patient has been advised to have forms re-faxed.

## 2021-03-22 ENCOUNTER — Other Ambulatory Visit: Payer: Self-pay | Admitting: *Deleted

## 2021-03-22 DIAGNOSIS — M792 Neuralgia and neuritis, unspecified: Secondary | ICD-10-CM

## 2021-03-22 MED ORDER — PREGABALIN 75 MG PO CAPS: 75.0000 mg | ORAL_CAPSULE | Freq: Two times a day (BID) | ORAL | 3 refills | Status: DC

## 2021-05-11 ENCOUNTER — Other Ambulatory Visit: Payer: Self-pay

## 2021-05-11 ENCOUNTER — Encounter: Payer: Medicaid Other | Admitting: Physical Medicine & Rehabilitation

## 2021-07-20 ENCOUNTER — Encounter: Payer: Medicaid Other | Admitting: Physical Medicine & Rehabilitation

## 2021-10-17 ENCOUNTER — Telehealth: Payer: Self-pay

## 2021-10-17 DIAGNOSIS — M792 Neuralgia and neuritis, unspecified: Secondary | ICD-10-CM

## 2021-10-17 NOTE — Telephone Encounter (Signed)
Patient is calling for a refill on Lyrica. She says she uses the [facility] for her medication.

## 2021-10-18 ENCOUNTER — Other Ambulatory Visit (HOSPITAL_COMMUNITY): Payer: Self-pay

## 2021-10-18 MED ORDER — PREGABALIN 75 MG PO CAPS
75.0000 mg | ORAL_CAPSULE | Freq: Two times a day (BID) | ORAL | 3 refills | Status: DC
  Filled 2021-10-18: qty 180, 90d supply, fill #0

## 2021-10-18 NOTE — Telephone Encounter (Signed)
Lyrica refilled

## 2021-10-18 NOTE — Addendum Note (Signed)
Addended by: Faith Rogue T on: 10/18/2021 01:07 PM   Modules accepted: Orders

## 2021-10-26 ENCOUNTER — Other Ambulatory Visit (HOSPITAL_COMMUNITY): Payer: Self-pay

## 2021-10-28 ENCOUNTER — Other Ambulatory Visit: Payer: Self-pay | Admitting: Physical Medicine & Rehabilitation

## 2021-10-31 NOTE — Telephone Encounter (Signed)
Please refill Lyrica.

## 2022-01-11 ENCOUNTER — Telehealth: Payer: Self-pay | Admitting: Physical Medicine & Rehabilitation

## 2022-01-11 NOTE — Telephone Encounter (Signed)
Patient states she does not have insurance and can't afford an office. She gets Lyrica on a patient assistance program. She made an appt for May expecting to have disability by then.

## 2022-01-11 NOTE — Telephone Encounter (Signed)
Called and left patient a detailed message to call and schedule an appt for refill for Pregabalin.

## 2022-01-11 NOTE — Telephone Encounter (Signed)
Patient is calling to request a refill on Lyrica.

## 2022-01-13 ENCOUNTER — Telehealth: Payer: Self-pay

## 2022-01-13 NOTE — Telephone Encounter (Signed)
Patient returned call about Lyrica prescription. Informed her that an appointment has to be made in order to get a prescription for the Lyrica. Transferred patient to the front desk to get scheduled

## 2022-01-13 NOTE — Telephone Encounter (Signed)
Called and left detailed message on patient VM. Requested a call back to make an appt to see Riley Lam for refill on Lyrica

## 2022-01-16 ENCOUNTER — Telehealth: Payer: Self-pay

## 2022-01-16 NOTE — Telephone Encounter (Signed)
Pt is calling to report Elevated BP. BP reading 150/92 last night 2/19.   Pt scheduled appt for 2/22.  Pt CB [phone]  FYI

## 2022-01-17 DIAGNOSIS — K5901 Slow transit constipation: Secondary | ICD-10-CM | POA: Diagnosis not present

## 2022-01-17 DIAGNOSIS — E7849 Other hyperlipidemia: Secondary | ICD-10-CM | POA: Diagnosis not present

## 2022-01-17 DIAGNOSIS — I10 Essential (primary) hypertension: Secondary | ICD-10-CM | POA: Diagnosis not present

## 2022-01-18 ENCOUNTER — Ambulatory Visit: Payer: Medicaid Other | Admitting: Internal Medicine

## 2022-01-18 ENCOUNTER — Encounter: Payer: Medicaid Other | Admitting: Registered Nurse

## 2022-01-19 DIAGNOSIS — E034 Atrophy of thyroid (acquired): Secondary | ICD-10-CM | POA: Diagnosis not present

## 2022-01-19 DIAGNOSIS — D649 Anemia, unspecified: Secondary | ICD-10-CM | POA: Diagnosis not present

## 2022-01-19 DIAGNOSIS — E876 Hypokalemia: Secondary | ICD-10-CM | POA: Diagnosis not present

## 2022-01-19 DIAGNOSIS — Z79899 Other long term (current) drug therapy: Secondary | ICD-10-CM | POA: Diagnosis not present

## 2022-01-19 DIAGNOSIS — E7849 Other hyperlipidemia: Secondary | ICD-10-CM | POA: Diagnosis not present

## 2022-01-23 ENCOUNTER — Other Ambulatory Visit (HOSPITAL_COMMUNITY): Payer: Self-pay

## 2022-01-23 DIAGNOSIS — E7849 Other hyperlipidemia: Secondary | ICD-10-CM | POA: Diagnosis not present

## 2022-01-23 DIAGNOSIS — K5901 Slow transit constipation: Secondary | ICD-10-CM | POA: Diagnosis not present

## 2022-01-23 DIAGNOSIS — I10 Essential (primary) hypertension: Secondary | ICD-10-CM | POA: Diagnosis not present

## 2022-01-23 MED ORDER — ATORVASTATIN CALCIUM 40 MG PO TABS
40.0000 mg | ORAL_TABLET | ORAL | 2 refills | Status: DC
  Filled 2022-01-23: qty 30, 30d supply, fill #0

## 2022-01-23 MED ORDER — LOSARTAN POTASSIUM 25 MG PO TABS
25.0000 mg | ORAL_TABLET | ORAL | 2 refills | Status: DC
  Filled 2022-01-23: qty 30, 30d supply, fill #0

## 2022-01-25 ENCOUNTER — Encounter: Payer: Medicaid Other | Admitting: Registered Nurse

## 2022-01-26 ENCOUNTER — Other Ambulatory Visit: Payer: Self-pay

## 2022-01-26 ENCOUNTER — Encounter: Payer: Self-pay | Admitting: Registered Nurse

## 2022-01-26 ENCOUNTER — Encounter: Payer: Medicaid Other | Attending: Registered Nurse | Admitting: Registered Nurse

## 2022-01-26 VITALS — BP 129/81 | HR 58 | Ht 61.0 in | Wt 112.2 lb

## 2022-01-26 DIAGNOSIS — G479 Sleep disorder, unspecified: Secondary | ICD-10-CM | POA: Diagnosis not present

## 2022-01-26 DIAGNOSIS — S14125S 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 of cervical spinal cord, sequela: Secondary | ICD-10-CM

## 2022-01-26 DIAGNOSIS — M533 Sacrococcygeal disorders, not elsewhere classified: Secondary | ICD-10-CM | POA: Insufficient documentation

## 2022-01-26 DIAGNOSIS — M792 Neuralgia and neuritis, unspecified: Secondary | ICD-10-CM | POA: Diagnosis not present

## 2022-01-26 DIAGNOSIS — Y9355 Activity, bike riding: Secondary | ICD-10-CM | POA: Diagnosis not present

## 2022-01-26 DIAGNOSIS — S14125A 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 of cervical spinal cord, initial encounter: Secondary | ICD-10-CM | POA: Diagnosis not present

## 2022-01-26 DIAGNOSIS — G9589 Other specified diseases of spinal cord: Secondary | ICD-10-CM | POA: Insufficient documentation

## 2022-01-26 DIAGNOSIS — M5416 Radiculopathy, lumbar region: Secondary | ICD-10-CM | POA: Insufficient documentation

## 2022-01-26 DIAGNOSIS — R001 Bradycardia, unspecified: Secondary | ICD-10-CM | POA: Insufficient documentation

## 2022-01-26 DIAGNOSIS — G894 Chronic pain syndrome: Secondary | ICD-10-CM | POA: Insufficient documentation

## 2022-01-26 DIAGNOSIS — M62838 Other muscle spasm: Secondary | ICD-10-CM | POA: Diagnosis not present

## 2022-01-26 DIAGNOSIS — M4726 Other spondylosis with radiculopathy, lumbar region: Secondary | ICD-10-CM | POA: Diagnosis not present

## 2022-01-26 MED ORDER — PREGABALIN 75 MG PO CAPS: ORAL_CAPSULE | ORAL | 2 refills | Status: DC
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
## 2022-01-31 ENCOUNTER — Other Ambulatory Visit (HOSPITAL_COMMUNITY): Payer: Self-pay

## 2022-02-02 ENCOUNTER — Other Ambulatory Visit: Payer: Self-pay

## 2022-02-02 ENCOUNTER — Other Ambulatory Visit (HOSPITAL_COMMUNITY): Payer: Self-pay

## 2022-04-26 ENCOUNTER — Ambulatory Visit: Payer: Medicaid Other | Admitting: Physical Medicine & Rehabilitation

## 2022-04-26 DIAGNOSIS — R072 Precordial pain: Secondary | ICD-10-CM | POA: Diagnosis not present

## 2022-04-26 DIAGNOSIS — E7849 Other hyperlipidemia: Secondary | ICD-10-CM | POA: Diagnosis not present

## 2022-04-26 DIAGNOSIS — I10 Essential (primary) hypertension: Secondary | ICD-10-CM | POA: Diagnosis not present

## 2022-04-26 DIAGNOSIS — K5901 Slow transit constipation: Secondary | ICD-10-CM | POA: Diagnosis not present

## 2022-05-01 ENCOUNTER — Telehealth: Payer: Self-pay | Admitting: *Deleted

## 2022-05-01 ENCOUNTER — Other Ambulatory Visit (HOSPITAL_COMMUNITY): Payer: Self-pay

## 2022-05-01 MED ORDER — PREGABALIN 75 MG PO CAPS: ORAL_CAPSULE | ORAL | 2 refills | Status: DC

## 2022-05-01 MED ORDER — PREGABALIN 75 MG PO CAPS: ORAL_CAPSULE | ORAL | 1 refills | Status: DC

## 2022-05-01 MED ORDER — PREGABALIN 75 MG PO CAPS
75.0000 mg | ORAL_CAPSULE | Freq: Two times a day (BID) | ORAL | 2 refills | Status: DC
  Filled 2022-05-01: qty 180, 90d supply, fill #0

## 2022-05-01 NOTE — Telephone Encounter (Deleted)
I spoke with Kelly Wall with Dr Berenice Bouton office . She will call and get central scheduling to get this set up --location needed to be changed.

## 2022-05-01 NOTE — Telephone Encounter (Signed)
Ms Cobler called and left a message that her patient assistance had expired and they were sending a new application for [place] to submit. I spoke with Ms Gombar and she has medicaid and she will not be allowed to apply for renewal of her patient assistance if she has insurance.  She was concerned about her copay but she has medicaid and it should be very low. We will not proceed with patient assistance application.

## 2022-05-03 ENCOUNTER — Other Ambulatory Visit (HOSPITAL_COMMUNITY): Payer: Self-pay

## 2022-07-26 ENCOUNTER — Encounter: Payer: Self-pay | Admitting: Physical Medicine & Rehabilitation

## 2022-07-26 ENCOUNTER — Encounter: Payer: Medicaid Other | Attending: Physical Medicine & Rehabilitation | Admitting: Physical Medicine & Rehabilitation

## 2022-07-26 ENCOUNTER — Other Ambulatory Visit (HOSPITAL_COMMUNITY): Payer: Self-pay

## 2022-07-26 VITALS — BP 130/79 | HR 51 | Ht 61.0 in | Wt 102.2 lb

## 2022-07-26 DIAGNOSIS — M5416 Radiculopathy, lumbar region: Secondary | ICD-10-CM | POA: Diagnosis not present

## 2022-07-26 DIAGNOSIS — M5417 Radiculopathy, lumbosacral region: Secondary | ICD-10-CM | POA: Diagnosis not present

## 2022-07-26 DIAGNOSIS — M792 Neuralgia and neuritis, unspecified: Secondary | ICD-10-CM | POA: Diagnosis not present

## 2022-07-26 DIAGNOSIS — S14125S 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 of cervical spinal cord, sequela: Secondary | ICD-10-CM | POA: Diagnosis not present

## 2022-07-26 DIAGNOSIS — M5412 Radiculopathy, cervical region: Secondary | ICD-10-CM

## 2022-07-26 DIAGNOSIS — K592 Neurogenic bowel, not elsewhere classified: Secondary | ICD-10-CM

## 2022-07-26 DIAGNOSIS — N319 Neuromuscular dysfunction of bladder, unspecified: Secondary | ICD-10-CM

## 2022-07-26 DIAGNOSIS — R1310 Dysphagia, unspecified: Secondary | ICD-10-CM | POA: Diagnosis not present

## 2022-07-26 DIAGNOSIS — X58XXXA Exposure to other specified factors, initial encounter: Secondary | ICD-10-CM | POA: Diagnosis not present

## 2022-07-26 DIAGNOSIS — S14125A 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 of cervical spinal cord, initial encounter: Secondary | ICD-10-CM | POA: Diagnosis not present

## 2022-07-26 DIAGNOSIS — S14125D 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 of cervical spinal cord, subsequent encounter: Secondary | ICD-10-CM | POA: Diagnosis not present

## 2022-07-26 DIAGNOSIS — M4807 Spinal stenosis, lumbosacral region: Secondary | ICD-10-CM | POA: Diagnosis not present

## 2022-07-26 MED ORDER — PREGABALIN 50 MG PO CAPS
50.0000 mg | ORAL_CAPSULE | Freq: Two times a day (BID) | ORAL | 2 refills | Status: DC
  Filled 2022-07-26: qty 60, 30d supply, fill #0
  Filled 2022-09-26: qty 60, 30d supply, fill #1
  Filled 2022-11-16: qty 60, 30d supply, fill #2

## 2022-07-26 NOTE — Patient Instructions (Addendum)
PLEASE FEEL FREE TO CALL OUR OFFICE WITH ANY PROBLEMS OR QUESTIONS [phone])   CONSIDER A DAILY SUPPOSITORY AS PART OF YOUR BOWEL PROGRAM.  IT MAY TAKE 10-15 MINUTES FOR YOUR BOWELS TO EMPTY.
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
## 2022-08-10 ENCOUNTER — Ambulatory Visit: Payer: Medicaid Other | Admitting: Physical Therapy

## 2022-08-10 ENCOUNTER — Encounter: Payer: Self-pay | Admitting: Physical Therapy

## 2022-08-10 ENCOUNTER — Ambulatory Visit: Payer: Medicaid Other | Attending: Physical Medicine & Rehabilitation | Admitting: Physical Therapy

## 2022-08-10 DIAGNOSIS — R2681 Unsteadiness on feet: Secondary | ICD-10-CM | POA: Insufficient documentation

## 2022-08-10 DIAGNOSIS — S14125S 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 of cervical spinal cord, sequela: Secondary | ICD-10-CM | POA: Diagnosis not present

## 2022-08-10 DIAGNOSIS — M6281 Muscle weakness (generalized): Secondary | ICD-10-CM | POA: Diagnosis present

## 2022-08-10 DIAGNOSIS — M5416 Radiculopathy, lumbar region: Secondary | ICD-10-CM | POA: Diagnosis not present

## 2022-08-10 DIAGNOSIS — R29818 Other symptoms and signs involving the nervous system: Secondary | ICD-10-CM | POA: Insufficient documentation

## 2022-08-10 DIAGNOSIS — R1310 Dysphagia, unspecified: Secondary | ICD-10-CM | POA: Diagnosis present

## 2022-08-10 DIAGNOSIS — R2689 Other abnormalities of gait and mobility: Secondary | ICD-10-CM | POA: Insufficient documentation
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
## 2022-08-15 DIAGNOSIS — G9520 Unspecified cord compression: Secondary | ICD-10-CM | POA: Diagnosis not present

## 2022-08-15 DIAGNOSIS — Z681 Body mass index (BMI) 19 or less, adult: Secondary | ICD-10-CM | POA: Diagnosis not present

## 2022-08-17 ENCOUNTER — Ambulatory Visit: Payer: Medicaid Other | Admitting: Physical Therapy

## 2022-08-17 DIAGNOSIS — M6281 Muscle weakness (generalized): Secondary | ICD-10-CM

## 2022-08-17 DIAGNOSIS — R2689 Other abnormalities of gait and mobility: Secondary | ICD-10-CM | POA: Diagnosis not present
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
## 2022-08-18 ENCOUNTER — Encounter: Payer: Self-pay | Admitting: Physical Therapy
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
## 2022-08-23 NOTE — Therapy (Deleted)
OUTPATIENT SPEECH LANGUAGE PATHOLOGY EVALUATION   Patient Name: Kelly Wall MRN: [id number] DOB:1961-06-10, 61 y.o., female Today's Date: 08/23/2022  PCP: Tresa Garter, MD REFERRING PROVIDER: Ranelle Oyster, MD    Past Medical History:  Diagnosis Date   Asthma    child- occ exersie induced now   Family history of adverse reaction to anesthesia    dad hard to awaken   Hypertension    no rx  in 5 yrs fish oil helps now   Paralysis [facility])    Past Surgical History:  Procedure Laterality Date   ANTERIOR CERVICAL DECOMP/DISCECTOMY FUSION N/A 03/30/2018   Procedure: ANTERIOR CERVICAL DECOMPRESSION/DISCECTOMY FUSION Cervical four-five and Cervical five-six;  Surgeon: Coletta Memos, MD;  Location: [facility] OR;  Service: Neurosurgery;  Laterality: N/A;   BACK SURGERY     CERVICAL DISC ARTHROPLASTY N/A 04/20/2016   Procedure: Cervical Six-Seven Artificial Disc Replacement-Cervical;  Surgeon: Tia Alert, MD;  Location: [facility];  Service: Neurosurgery;  Laterality: N/A;   Patient Active Problem List   Diagnosis Date Noted   Discoloration of skin of toe 01/02/2021   Anemia 12/29/2020   Neuropathic pain 11/10/2020   Spondylolisthesis of lumbar region 01/30/2020   Lumbar radiculopathy 10/15/2019   Spondylosis of lumbar spine 08/06/2019   Lumbar facet arthropathy 02/05/2019   Cervical myelopathy (HCC) 04/02/2018   Bilateral arm weakness    Numbness and tingling in both hands    Paresthesia and pain of both upper extremities    Trauma    Post-operative pain    Uncomplicated asthma    Benign essential HTN    Bradycardia    Steroid-induced hyperglycemia    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 of cervical spinal cord (HCC) 03/30/2018   S/P cervical spinal fusion 04/20/2016   Cervical radicular pain 03/22/2016   Hamstring tightness 12/11/2014   Pterygium eye 11/17/2014   Pain in joint, upper arm 11/17/2014   Labyrinthitis 05/15/2012   Sinusitis acute 02/12/2012   Well  adult exam 06/27/2011   Rash, skin 06/27/2011   ADHESIVE CAPSULITIS, LEFT 11/09/2009   SHOULDER PAIN 09/09/2009   HAIR LOSS 07/24/2008   ELEVATED BP 07/24/2008   CARPAL TUNNEL SYNDROME 03/23/2008   PARESTHESIA 03/23/2008   MIGRAINE HEADACHE 08/02/2007    ONSET DATE: referral 07/26/2022   REFERRING DIAG:  S14.125S (ICD-10-CM) - 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  Rationale for Evaluation and Treatment {HABREHAB:27488}  SUBJECTIVE:   SUBJECTIVE STATEMENT: *** Pt accompanied by: {accompnied:27141}  PERTINENT HISTORY: ***  PAIN:  Are you having pain? {OPRCPAIN:27236}   FALLS: Has patient fallen in last 6 months?  Yes, See PT evaluation for details  LIVING ENVIRONMENT: Lives with: lives with their family Lives in: House/apartment  PLOF:  Level of assistance: Independent with IADLs Employment: On disability   PATIENT GOALS ***  OBJECTIVE:   DIAGNOSTIC FINDINGS: n/a  COGNITION: Overall cognitive status: {cognition:24006} Areas of impairment:  {cognitiveimpairmentslp:27409} Functional deficits: ***  COGNITIVE COMMUNICATION Following directions: {commands:24018}  Auditory comprehension: {WFL-Impaired:25365} Verbal expression: {WFL-Impaired:25365} Functional communication: {WFL-Impaired:25365}  ORAL MOTOR EXAMINATION Overall status: {OMESLP2:27645} Comments: ***  STANDARDIZED ASSESSMENTS: {SLPstandardizedassessment:27092}   PATIENT REPORTED OUTCOME MEASURES (PROM): {SLPPROM:27095}   TODAY'S TREATMENT:  ***   PATIENT EDUCATION: Education details: *** Person educated: {Person educated:25204} Education method: {Education Method:25205} Education comprehension: {Education Comprehension:25206}     GOALS: Goals reviewed with patient? {yes/no:20286}  SHORT  TERM GOALS: Target date: {follow up:25551}  (Remove Blue  Hyperlink)  *** Baseline: Goal status: {GOALSTATUS:25110}  2.  *** Baseline:  Goal status: {GOALSTATUS:25110}  3.  *** Baseline:  Goal status: {GOALSTATUS:25110}  4.  *** Baseline:  Goal status: {GOALSTATUS:25110}  5.  *** Baseline:  Goal status: {GOALSTATUS:25110}  6.  *** Baseline:  Goal status: {GOALSTATUS:25110}  LONG TERM GOALS: Target date: {follow up:25551}  (Remove Blue Hyperlink)  *** Baseline:  Goal status: {GOALSTATUS:25110}  2.  *** Baseline:  Goal status: {GOALSTATUS:25110}  3.  *** Baseline:  Goal status: {GOALSTATUS:25110}  4.  *** Baseline:  Goal status: {GOALSTATUS:25110}  5.  *** Baseline:  Goal status: {GOALSTATUS:25110}  6.  *** Baseline:  Goal status: {GOALSTATUS:25110}  ASSESSMENT:  CLINICAL IMPRESSION: Patient is a *** y.o. *** who was seen today for ***.   OBJECTIVE IMPAIRMENTS include {SLPOBJIMP:27107}. These impairments are limiting patient from {SLPLIMIT:27108}. Factors affecting potential to achieve goals and functional outcome are {SLP factors:25450}.. Patient will benefit from skilled SLP services to address above impairments and improve overall function.  REHAB POTENTIAL: {rehabpotential:25112}  PLAN: SLP FREQUENCY: {rehab frequency:25116}  SLP DURATION: {rehab duration:25117}  PLANNED INTERVENTIONS: {SLP treatment/interventions:25449}    Su Monks, CCC-SLP 08/23/2022, 8:33 AM

## 2022-08-24 ENCOUNTER — Encounter: Payer: Self-pay | Admitting: Physical Therapy

## 2022-08-24 ENCOUNTER — Ambulatory Visit: Payer: Medicaid Other | Admitting: Physical Therapy

## 2022-08-24 ENCOUNTER — Encounter: Payer: Self-pay | Admitting: Speech Pathology

## 2022-08-24 ENCOUNTER — Ambulatory Visit: Payer: Medicaid Other | Admitting: Speech Pathology

## 2022-08-24 DIAGNOSIS — R1310 Dysphagia, unspecified: Secondary | ICD-10-CM

## 2022-08-24 DIAGNOSIS — M6281 Muscle weakness (generalized): Secondary | ICD-10-CM

## 2022-08-24 DIAGNOSIS — R2689 Other abnormalities of gait and mobility: Secondary | ICD-10-CM

## 2022-08-24 NOTE — Patient Instructions (Signed)
HIP: Extension / KNEE: Flexion - Prone    Bend knee, squeeze glutes. Raise leg up. _10__ reps per set, _3__ sets per day, __5_ days per week   Copyright  VHI. All rights reserved.
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
## 2022-08-31 ENCOUNTER — Ambulatory Visit: Payer: Medicaid Other | Admitting: Speech Pathology

## 2022-08-31 ENCOUNTER — Ambulatory Visit: Payer: Medicaid Other | Attending: Physical Medicine & Rehabilitation | Admitting: Physical Therapy

## 2022-08-31 DIAGNOSIS — R29818 Other symptoms and signs involving the nervous system: Secondary | ICD-10-CM | POA: Insufficient documentation

## 2022-08-31 DIAGNOSIS — M6281 Muscle weakness (generalized): Secondary | ICD-10-CM | POA: Diagnosis present

## 2022-08-31 DIAGNOSIS — R2689 Other abnormalities of gait and mobility: Secondary | ICD-10-CM | POA: Insufficient documentation
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
## 2022-09-01 ENCOUNTER — Encounter: Payer: Self-pay | Admitting: Physical Therapy

## 2022-09-07 ENCOUNTER — Ambulatory Visit: Payer: Medicaid Other | Admitting: Physical Therapy

## 2022-09-07 ENCOUNTER — Encounter: Payer: Medicaid Other | Admitting: Speech Pathology

## 2022-09-12 ENCOUNTER — Ambulatory Visit: Payer: Medicaid Other | Admitting: Physical Therapy

## 2022-09-12 DIAGNOSIS — R2689 Other abnormalities of gait and mobility: Secondary | ICD-10-CM | POA: Diagnosis not present

## 2022-09-12 DIAGNOSIS — R29818 Other symptoms and signs involving the nervous system: Secondary | ICD-10-CM

## 2022-09-12 DIAGNOSIS — M6281 Muscle weakness (generalized): Secondary | ICD-10-CM
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
## 2022-09-12 NOTE — Patient Instructions (Signed)
  Aquatic Therapy: What to Expect!  Where:  [facility] [address] [place], [place]  [address] [phone]  NOTE:  You will receive an automated phone message reminding you of your appointment and it will say the appointment is at the Rehab Center on [address]  We are working to fix this- just know that you will meet us at the pool!  How to Prepare: Please make sure you drink 8 ounces of water about one hour prior to your pool session A caregiver MUST attend the entire session with the patient.  The caregiver will be responsible for assisting with dressing as well as any toileting needs.  If the patient will be doing a home program this should likely be the person who will assist as well.  Patients must wear either their street shoes or pool shoes until they are ready to enter the pool with the therapist.  Patients must also wear either street shoes or pool shoes once exiting the pool to walk to the locker room.  This will helps us prevent slips and falls.  Please arrive 15 minutes early to prepare for your pool therapy session Sign in at the front desk on the clipboard marked for [place] You may use the locker rooms on your right and then enter directly into the recreation pool (NOT the competition pool) Please make sure to attend to any toileting needs prior to entering the pool Please be dressed in your swim suit and on the pool deck at least 5 minutes before your appointment Once on the pool deck your therapist will ask you to sign the Patient  Consent and Assignment of Benefits form Your therapist may take your blood pressure prior to, during and after your session if indicated  About the pool  and parking: Entering the pool Your therapist will assist you; there are 2 ways to enter:  stairs with railings or with a chair lift.   Your therapist will determine the most appropriate way for you. Water temperature is usually between 86-87 degrees There  may be other swimmers in the pool at the same time Parking is free.   Contact Info:     Appointments: [facility]  All sessions are 45 minutes   [address]  [address]     Please call the [facility] if   [place], [place]   [address]    you need to cancel or reschedule an appointment.  [phone]       

## 2022-09-13 ENCOUNTER — Encounter: Payer: Self-pay | Admitting: Physical Therapy

## 2022-09-18 ENCOUNTER — Ambulatory Visit: Payer: Medicaid Other | Admitting: Physical Therapy

## 2022-09-18 DIAGNOSIS — R2689 Other abnormalities of gait and mobility: Secondary | ICD-10-CM

## 2022-09-18 DIAGNOSIS — R29818 Other symptoms and signs involving the nervous system: Secondary | ICD-10-CM

## 2022-09-18 DIAGNOSIS — M6281 Muscle weakness (generalized): Secondary | ICD-10-CM

## 2022-09-19 ENCOUNTER — Encounter: Payer: Self-pay | Admitting: Physical Therapy
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
## 2022-09-25 ENCOUNTER — Ambulatory Visit: Payer: Self-pay | Admitting: Physical Therapy

## 2022-09-25 DIAGNOSIS — R29818 Other symptoms and signs involving the nervous system: Secondary | ICD-10-CM

## 2022-09-25 DIAGNOSIS — R2689 Other abnormalities of gait and mobility: Secondary | ICD-10-CM

## 2022-09-25 DIAGNOSIS — M6281 Muscle weakness (generalized): Secondary | ICD-10-CM

## 2022-09-26 ENCOUNTER — Encounter: Payer: Self-pay | Admitting: Physical Therapy

## 2022-09-26 ENCOUNTER — Other Ambulatory Visit (HOSPITAL_COMMUNITY): Payer: Self-pay
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
## 2022-11-16 ENCOUNTER — Other Ambulatory Visit (HOSPITAL_COMMUNITY): Payer: Self-pay

## 2023-01-10 ENCOUNTER — Encounter: Payer: Self-pay | Admitting: Physical Medicine & Rehabilitation

## 2023-01-10 ENCOUNTER — Other Ambulatory Visit (HOSPITAL_COMMUNITY): Payer: Self-pay

## 2023-01-10 ENCOUNTER — Encounter: Payer: Medicaid Other | Attending: Physical Medicine & Rehabilitation | Admitting: Physical Medicine & Rehabilitation

## 2023-01-10 VITALS — BP 159/96 | HR 56 | Ht 61.0 in | Wt 102.0 lb

## 2023-01-10 DIAGNOSIS — R252 Cramp and spasm: Secondary | ICD-10-CM | POA: Diagnosis present

## 2023-01-10 DIAGNOSIS — M792 Neuralgia and neuritis, unspecified: Secondary | ICD-10-CM | POA: Diagnosis present

## 2023-01-10 DIAGNOSIS — S14125S 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 of cervical spinal cord, sequela: Secondary | ICD-10-CM | POA: Insufficient documentation

## 2023-01-10 DIAGNOSIS — M5416 Radiculopathy, lumbar region: Secondary | ICD-10-CM | POA: Diagnosis present

## 2023-01-10 MED ORDER — PREGABALIN 50 MG PO CAPS
50.0000 mg | ORAL_CAPSULE | Freq: Two times a day (BID) | ORAL | 2 refills | Status: DC
  Filled 2023-01-10: qty 180, 90d supply, fill #0

## 2023-01-10 NOTE — Patient Instructions (Signed)
ALWAYS FEEL FREE TO CALL OUR OFFICE WITH ANY PROBLEMS OR QUESTIONS GU:[id number])  **PLEASE NOTE** ALL MEDICATION REFILL REQUESTS (INCLUDING CONTROLLED SUBSTANCES) NEED TO BE MADE AT LEAST 7 DAYS PRIOR TO REFILL BEING DUE. ANY REFILL REQUESTS INSIDE THAT TIME FRAME MAY RESULT IN DELAYS IN RECEIVING YOUR PRESCRIPTION.                   TRY SCHEDULING YOUR BLADDER EMPTYING (VOIDS) EVERY 3-4 HOURS WHILE YOU'RE AWAKE. THIS MAY HELP OCCASIONAL INCONTINENCE YOU'RE EXPERIENCING
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
## 2023-01-24 ENCOUNTER — Ambulatory Visit: Payer: Medicaid Other | Admitting: Physical Medicine & Rehabilitation

## 2023-05-18 ENCOUNTER — Other Ambulatory Visit (HOSPITAL_COMMUNITY): Payer: Self-pay

## 2023-05-18 MED ORDER — LOSARTAN POTASSIUM 50 MG PO TABS
50.0000 mg | ORAL_TABLET | Freq: Every day | ORAL | 2 refills | Status: DC
  Filled 2023-05-18: qty 90, 90d supply, fill #0

## 2023-05-21 ENCOUNTER — Other Ambulatory Visit (HOSPITAL_COMMUNITY): Payer: Self-pay

## 2023-10-08 ENCOUNTER — Telehealth: Payer: Self-pay

## 2023-10-08 NOTE — Telephone Encounter (Signed)
Patient is requesting Botox for spasticity. She has never had it before. She has no insurance and wants to pay out of pocket. Please advise

## 2023-10-10 NOTE — Telephone Encounter (Signed)
Pt.notified

## 2023-10-17 ENCOUNTER — Telehealth: Payer: Self-pay | Admitting: *Deleted

## 2023-10-17 DIAGNOSIS — R252 Cramp and spasm: Secondary | ICD-10-CM

## 2023-10-17 NOTE — Telephone Encounter (Signed)
Kelly Wall says tht Dr Alvester Morin does not do botox so she called [facility] but she does ned the referral from DR Riley Kill.

## 2023-10-17 NOTE — Telephone Encounter (Signed)
Notified of Dr Riley Kill response.

## 2023-10-17 NOTE — Telephone Encounter (Signed)
Referral made 

## 2023-10-17 NOTE — Addendum Note (Signed)
Addended by: Faith Rogue T on: 10/17/2023 03:42 PM   Modules accepted: Orders

## 2023-10-17 NOTE — Telephone Encounter (Signed)
Ms Klinger called and said the first appt for Dr Riley Kill for possible botox is jan 29 and that is too long. Her follow up appt is 2/19/5. She is asking if she could be referred to [facility] for botox injection.

## 2023-10-19 ENCOUNTER — Telehealth: Payer: Self-pay

## 2023-10-19 NOTE — Telephone Encounter (Signed)
Pt has an appt and is on cancellation list

## 2023-10-19 NOTE — Telephone Encounter (Signed)
[facility] response We received this referral and after review from our provider we do not do Botox for this DX. Please send the referral elsewhere.  Patient is now calling for further instructions.

## 2024-01-02 ENCOUNTER — Encounter: Payer: Self-pay | Admitting: Physical Medicine & Rehabilitation

## 2024-01-02 ENCOUNTER — Encounter: Payer: Self-pay | Attending: Physical Medicine & Rehabilitation | Admitting: Physical Medicine & Rehabilitation

## 2024-01-02 VITALS — BP 146/83 | HR 62 | Wt 110.0 lb

## 2024-01-02 DIAGNOSIS — G894 Chronic pain syndrome: Secondary | ICD-10-CM | POA: Insufficient documentation

## 2024-01-02 DIAGNOSIS — R252 Cramp and spasm: Secondary | ICD-10-CM | POA: Insufficient documentation

## 2024-01-02 DIAGNOSIS — M5416 Radiculopathy, lumbar region: Secondary | ICD-10-CM | POA: Insufficient documentation
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
## 2024-01-02 NOTE — Patient Instructions (Signed)
 ALWAYS FEEL FREE TO CALL OUR OFFICE WITH ANY PROBLEMS OR QUESTIONS [phone])  **PLEASE NOTE** ALL MEDICATION REFILL REQUESTS (INCLUDING CONTROLLED SUBSTANCES) NEED TO BE MADE AT LEAST 7 DAYS PRIOR TO REFILL BEING DUE. ANY REFILL REQUESTS INSIDE THAT TIME FRAME MAY RESULT IN DELAYS IN RECEIVING YOUR PRESCRIPTION.

## 2024-01-16 ENCOUNTER — Ambulatory Visit: Payer: Medicaid Other | Admitting: Physical Medicine & Rehabilitation

## 2024-02-20 ENCOUNTER — Ambulatory Visit: Payer: Medicaid Other | Admitting: Physical Medicine & Rehabilitation

## 2024-03-01 ENCOUNTER — Ambulatory Visit (HOSPITAL_COMMUNITY)
Admission: EM | Admit: 2024-03-01 | Discharge: 2024-03-01 | Disposition: A | Discharge status: Home / Self Care | Payer: Self-pay | Attending: Physician Assistant | Admitting: Physician Assistant

## 2024-03-01 ENCOUNTER — Encounter (HOSPITAL_COMMUNITY): Payer: Self-pay

## 2024-03-01 DIAGNOSIS — R202 Paresthesia of skin: Secondary | ICD-10-CM

## 2024-03-01 DIAGNOSIS — R200 Anesthesia of skin: Secondary | ICD-10-CM

## 2024-03-01 DIAGNOSIS — R519 Headache, unspecified: Secondary | ICD-10-CM

## 2024-03-01 DIAGNOSIS — R030 Elevated blood-pressure reading, without diagnosis of hypertension: Secondary | ICD-10-CM

## 2024-03-01 MED ORDER — HYDRALAZINE HCL 25 MG PO TABS: 25.0000 mg | ORAL_TABLET | Freq: Three times a day (TID) | ORAL | 0 refills | Status: DC

## 2024-03-01 NOTE — Discharge Instructions (Addendum)
 At this time I suspect that your blood pressure is likely causing most of your symptoms.  It was slightly elevated today in the clinic and can lead to some of the symptoms that you are having.  Your neurological exam was overall reassuring and appears to be similar to your baseline. I have sent in a medication called Hydralazine 25 mg for you to take as needed for elevated BP readings.  Please continue to take your blood pressure at home.  If you notice that your blood pressure is getting over 150 systolic and over 95 diastolic I would like you to take a hydralazine.  If your blood pressure continues to be elevated despite the hydralazine and you continue to have the numbness, headache and tingling or more severe symptoms please go to the emergency room for further evaluation and imaging as these could be signs of a medical emergency. If at any point you start to notice more severe symptoms or experience falls, passing out, seizure, intense headache please go to the emergency room.
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
## 2024-03-01 NOTE — ED Triage Notes (Signed)
 Patient is here for blood pressure concerns. Patient states when her blood pressure flare-up her nerve pain is worse.

## 2024-03-02 ENCOUNTER — Other Ambulatory Visit: Payer: Self-pay

## 2024-03-02 ENCOUNTER — Emergency Department (HOSPITAL_BASED_OUTPATIENT_CLINIC_OR_DEPARTMENT_OTHER): Payer: Self-pay

## 2024-03-02 ENCOUNTER — Emergency Department (HOSPITAL_COMMUNITY): Payer: Self-pay

## 2024-03-02 ENCOUNTER — Emergency Department (HOSPITAL_BASED_OUTPATIENT_CLINIC_OR_DEPARTMENT_OTHER)
Admission: EM | Admit: 2024-03-02 | Discharge: 2024-03-02 | Disposition: A | Discharge status: Home / Self Care | Payer: Self-pay | Attending: Emergency Medicine | Admitting: Emergency Medicine

## 2024-03-02 ENCOUNTER — Encounter (HOSPITAL_BASED_OUTPATIENT_CLINIC_OR_DEPARTMENT_OTHER): Payer: Self-pay | Admitting: Emergency Medicine

## 2024-03-02 DIAGNOSIS — G959 Disease of spinal cord, unspecified: Secondary | ICD-10-CM | POA: Diagnosis not present

## 2024-03-02 DIAGNOSIS — Z79899 Other long term (current) drug therapy: Secondary | ICD-10-CM | POA: Diagnosis not present

## 2024-03-02 DIAGNOSIS — X58XXXD Exposure to other specified factors, subsequent encounter: Secondary | ICD-10-CM | POA: Diagnosis not present

## 2024-03-02 DIAGNOSIS — I10 Essential (primary) hypertension: Secondary | ICD-10-CM

## 2024-03-02 DIAGNOSIS — S199XXD Unspecified injury of neck, subsequent encounter: Secondary | ICD-10-CM | POA: Diagnosis present

## 2024-03-02 DIAGNOSIS — S14129D Central cord syndrome at unspecified level of cervical spinal cord, subsequent encounter: Secondary | ICD-10-CM | POA: Insufficient documentation

## 2024-03-02 LAB — DIFFERENTIAL
Abs Immature Granulocytes: 0.01 10*3/uL (ref 0.00–0.07)
Basophils Absolute: 0.1 10*3/uL (ref 0.0–0.1)
Basophils Relative: 1 %
Eosinophils Absolute: 0.1 10*3/uL (ref 0.0–0.5)
Eosinophils Relative: 2 %
Immature Granulocytes: 0 %
Lymphocytes Relative: 36 %
Lymphs Abs: 1.8 10*3/uL (ref 0.7–4.0)
Monocytes Absolute: 0.3 10*3/uL (ref 0.1–1.0)
Monocytes Relative: 7 %
Neutro Abs: 2.6 10*3/uL (ref 1.7–7.7)
Neutrophils Relative %: 54 %

## 2024-03-02 LAB — RAPID URINE DRUG SCREEN, HOSP PERFORMED
Amphetamines: NOT DETECTED
Barbiturates: NOT DETECTED
Benzodiazepines: NOT DETECTED
Cocaine: NOT DETECTED
Opiates: NOT DETECTED
Tetrahydrocannabinol: NOT DETECTED

## 2024-03-02 LAB — CBC
HCT: 41.7 % (ref 36.0–46.0)
Hemoglobin: 14.7 g/dL (ref 12.0–15.0)
MCH: 32.7 pg (ref 26.0–34.0)
MCHC: 35.3 g/dL (ref 30.0–36.0)
MCV: 92.9 fL (ref 80.0–100.0)
Platelets: 235 10*3/uL (ref 150–400)
RBC: 4.49 MIL/uL (ref 3.87–5.11)
RDW: 12.4 % (ref 11.5–15.5)
WBC: 4.9 10*3/uL (ref 4.0–10.5)
nRBC: 0 % (ref 0.0–0.2)

## 2024-03-02 LAB — URINALYSIS, ROUTINE W REFLEX MICROSCOPIC
Bilirubin Urine: NEGATIVE
Glucose, UA: NEGATIVE mg/dL
Hgb urine dipstick: NEGATIVE
Ketones, ur: NEGATIVE mg/dL
Leukocytes,Ua: NEGATIVE
Nitrite: NEGATIVE
Protein, ur: NEGATIVE mg/dL
Specific Gravity, Urine: <1.005 — ABNORMAL LOW (ref 1.005–1.030)
pH: 7 (ref 5.0–8.0)

## 2024-03-02 LAB — COMPREHENSIVE METABOLIC PANEL WITH GFR
ALT: 18 U/L (ref 0–44)
AST: 26 U/L (ref 15–41)
Albumin: 4.7 g/dL (ref 3.5–5.0)
Alkaline Phosphatase: 57 U/L (ref 38–126)
Anion gap: 7 (ref 5–15)
BUN: 5 mg/dL — ABNORMAL LOW (ref 8–23)
CO2: 27 mmol/L (ref 22–32)
Calcium: 9.3 mg/dL (ref 8.9–10.3)
Chloride: 94 mmol/L — ABNORMAL LOW (ref 98–111)
Creatinine, Ser: 0.63 mg/dL (ref 0.44–1.00)
GFR, Estimated: >60 mL/min (ref >60)
Glucose, Bld: 90 mg/dL (ref 70–99)
Potassium: 3.6 mmol/L (ref 3.5–5.1)
Sodium: 128 mmol/L — ABNORMAL LOW (ref 135–145)
Total Bilirubin: 0.7 mg/dL (ref 0.0–1.2)
Total Protein: 7.5 g/dL (ref 6.5–8.1)

## 2024-03-02 LAB — PROTIME-INR
INR: 0.9 (ref 0.8–1.2)
Prothrombin Time: 12.2 s (ref 11.4–15.2)

## 2024-03-02 LAB — ETHANOL: Alcohol, Ethyl (B): <10 mg/dL (ref <10)

## 2024-03-02 MED ORDER — METHYLPREDNISOLONE 4 MG PO TBPK: ORAL_TABLET | ORAL | 0 refills | Status: DC

## 2024-03-02 MED ORDER — AMLODIPINE BESYLATE 5 MG PO TABS: 5.0000 mg | ORAL_TABLET | Freq: Every day | ORAL | 3 refills | Status: DC

## 2024-03-02 NOTE — Discharge Instructions (Signed)
 There is a hotline number in this paperwork that you can call to try and get a primary care provider.  Please call the number if needed to try and establish care with once he could follow-up with 1 for your blood pressure.  I have started you on a blood pressure medication.  I would have you stop the hydralazine.    Your MRI did not show any obvious acute finding.  Please follow-up with your neurosurgeon in the office.

## 2024-03-02 NOTE — ED Triage Notes (Signed)
 Pt caox4, NAD c/o increase in chronic nerve pain in extremities for the past few days. Pt reports she had increased BP as well so she went to UC yesterday and that they prescribed her hydralazine but s/s have not improved.
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
## 2024-03-02 NOTE — ED Provider Notes (Signed)
 Assumed care of the patient at 1615.  [facility] brain C and T-spine.  Patient has a history of central cord syndrome occurred about 5 years ago and has had chronic pain since.  Has had some worsening pain over the past few days to a week.  Plan for negative for acute change follow-up.  Patient seems very concerned.  She mentioned it multiple times in her initial encounter.     MRI with multiple findings.  I discussed this with Leo Grosser, NP she felt that these were chronic and did not require acute intervention.  Recommended that she follow-up with her neurosurgeon in the office.  Medrol Dosepak.  Patient's blood pressure is mildly elevated here.  She is very concerned about this.  Per our recent protocol I will start her on amlodipine.      Melene Plan, DO 03/02/24 1947

## 2024-03-06 ENCOUNTER — Other Ambulatory Visit: Payer: Self-pay

## 2024-03-06 DIAGNOSIS — Z79899 Other long term (current) drug therapy: Secondary | ICD-10-CM | POA: Insufficient documentation

## 2024-03-06 DIAGNOSIS — R531 Weakness: Secondary | ICD-10-CM | POA: Insufficient documentation

## 2024-03-06 DIAGNOSIS — I10 Essential (primary) hypertension: Secondary | ICD-10-CM | POA: Diagnosis not present

## 2024-03-06 DIAGNOSIS — R202 Paresthesia of skin: Secondary | ICD-10-CM | POA: Diagnosis not present

## 2024-03-06 DIAGNOSIS — R0789 Other chest pain: Secondary | ICD-10-CM | POA: Diagnosis not present

## 2024-03-06 DIAGNOSIS — J45909 Unspecified asthma, uncomplicated: Secondary | ICD-10-CM | POA: Insufficient documentation

## 2024-03-06 NOTE — ED Triage Notes (Signed)
 Pt POV reporting chest tightness and weakness in arms and legs, hx central cord syndrome, seen for same 4/6, told to follow up with neurology.

## 2024-03-07 ENCOUNTER — Encounter (HOSPITAL_BASED_OUTPATIENT_CLINIC_OR_DEPARTMENT_OTHER): Payer: Self-pay

## 2024-03-07 ENCOUNTER — Emergency Department (HOSPITAL_BASED_OUTPATIENT_CLINIC_OR_DEPARTMENT_OTHER): Payer: Self-pay

## 2024-03-07 ENCOUNTER — Emergency Department (HOSPITAL_BASED_OUTPATIENT_CLINIC_OR_DEPARTMENT_OTHER)
Admission: EM | Admit: 2024-03-07 | Discharge: 2024-03-07 | Disposition: A | Discharge status: Home / Self Care | Payer: Self-pay | Attending: Emergency Medicine | Admitting: Emergency Medicine

## 2024-03-07 ENCOUNTER — Other Ambulatory Visit: Payer: Self-pay

## 2024-03-07 DIAGNOSIS — R0789 Other chest pain: Secondary | ICD-10-CM

## 2024-03-07 LAB — CBC WITH DIFFERENTIAL/PLATELET
Abs Immature Granulocytes: 0 10*3/uL (ref 0.00–0.07)
Basophils Absolute: 0.1 10*3/uL (ref 0.0–0.1)
Basophils Relative: 1 %
Eosinophils Absolute: 0.2 10*3/uL (ref 0.0–0.5)
Eosinophils Relative: 4 %
HCT: 39 % (ref 36.0–46.0)
Hemoglobin: 14.2 g/dL (ref 12.0–15.0)
Immature Granulocytes: 0 %
Lymphocytes Relative: 27 %
Lymphs Abs: 1.3 10*3/uL (ref 0.7–4.0)
MCH: 32.8 pg (ref 26.0–34.0)
MCHC: 36.4 g/dL — ABNORMAL HIGH (ref 30.0–36.0)
MCV: 90.1 fL (ref 80.0–100.0)
Monocytes Absolute: 0.5 10*3/uL (ref 0.1–1.0)
Monocytes Relative: 10 %
Neutro Abs: 2.8 10*3/uL (ref 1.7–7.7)
Neutrophils Relative %: 58 %
Platelets: 217 10*3/uL (ref 150–400)
RBC: 4.33 MIL/uL (ref 3.87–5.11)
RDW: 11.9 % (ref 11.5–15.5)
WBC: 4.9 10*3/uL (ref 4.0–10.5)
nRBC: 0 % (ref 0.0–0.2)

## 2024-03-07 LAB — BASIC METABOLIC PANEL WITH GFR
Anion gap: 9 (ref 5–15)
BUN: 8 mg/dL (ref 8–23)
CO2: 25 mmol/L (ref 22–32)
Calcium: 8.9 mg/dL (ref 8.9–10.3)
Chloride: 96 mmol/L — ABNORMAL LOW (ref 98–111)
Creatinine, Ser: 0.55 mg/dL (ref 0.44–1.00)
GFR, Estimated: >60 mL/min (ref >60)
Glucose, Bld: 114 mg/dL — ABNORMAL HIGH (ref 70–99)
Potassium: 3.3 mmol/L — ABNORMAL LOW (ref 3.5–5.1)
Sodium: 130 mmol/L — ABNORMAL LOW (ref 135–145)

## 2024-03-07 LAB — TROPONIN I (HIGH SENSITIVITY)
Troponin I (High Sensitivity): 3 ng/L (ref <18)
Troponin I (High Sensitivity): 3 ng/L (ref <18)

## 2024-03-07 LAB — BRAIN NATRIURETIC PEPTIDE: B Natriuretic Peptide: 55.8 pg/mL (ref 0.0–100.0)

## 2024-03-07 MED ORDER — ASPIRIN 81 MG PO CHEW
324.0000 mg | CHEWABLE_TABLET | Freq: Once | ORAL | Status: AC
  Administered 2024-03-07: 324 mg via ORAL
  Filled 2024-03-07: qty 4

## 2024-03-07 NOTE — Discharge Instructions (Signed)
 You were seen today for chest discomfort.  Your workup today is reassuring.  It is very important that you follow-up with Dr. Algie Coffer.  You may need further cardiology workup.

## 2024-03-07 NOTE — ED Provider Notes (Signed)
 [place] [facility] AT [facility] Provider Note   CSN: [id number] Arrival date & time: 03/06/24  2344     History  Chief Complaint  Patient presents with   Chest Pain    Kelly Wall is a 63 y.o. female.  HPI     This is a 63 year old female with a history of central cord syndrome who presents with chest tightness.  Patient reports at baseline she has some weakness and tingling in her arms and legs.  She states that when her blood pressure gets high she specifically gets tingling in the bilateral hands.  She developed chest tightness tonight which she has had before.  It has been ongoing for the last hour.  It is not exertional in nature.  She denies any history of heart disease.  Home Medications Prior to Admission medications   Medication Sig Start Date End Date Taking? Authorizing Provider  acetaminophen (TYLENOL) 500 MG tablet Take 500 mg by mouth at bedtime as needed for headache (pain).    [provider]  amLODipine (NORVASC) 5 MG tablet Take 1 tablet (5 mg total) by mouth daily. 03/02/24   Melene Plan, DO  atorvastatin (LIPITOR) 40 MG tablet Take 1 tablet (40 mg total) by mouth once daily at 6pm 01/23/22   Orpah Cobb, MD  baclofen (LIORESAL) 10 MG tablet TAKE 1 TABLET BY MOUTH EVERY 8 HOURS AS NEEDED FOR MUSCLE SPASMS. 04/05/20   Ranelle Oyster, MD  Biotin 5000 MCG CAPS Take 5,000 mcg by mouth at bedtime.     [provider]  Cholecalciferol 25 MCG (1000 UT) tablet Take 1,000 Units by mouth at bedtime.    [provider]  clindamycin (CLEOCIN T) 1 % lotion Apply 1 application topically daily as needed (acne).     [provider]  clindamycin-benzoyl peroxide (BENZACLIN) gel Apply 1 application topically every morning. 01/02/20   [provider]  cyclobenzaprine (FLEXERIL) 5 MG tablet     [provider]  diclofenac (VOLTAREN) 75 MG EC tablet     [provider]  gabapentin (NEURONTIN)  600 MG tablet     [provider]  hydrALAZINE (APRESOLINE) 25 MG tablet Take 1 tablet (25 mg total) by mouth 3 (three) times daily. 03/01/24   Mecum, Erin E, PA-C  losartan (COZAAR) 25 MG tablet Take 1 tablet (25 mg total) by mouth once daily 01/23/22   Orpah Cobb, MD  losartan (COZAAR) 50 MG tablet Take 1 tablet (50 mg total) by mouth daily. 05/18/23     methylPREDNISolone (MEDROL DOSEPAK) 4 MG TBPK tablet Day 1: 8mg  before breakfast, 4 mg after lunch, 4 mg after supper, and 8 mg at bedtime Day 2: 4 mg before breakfast, 4 mg after lunch, 4 mg  after supper, and 8 mg  at bedtime Day 3:  4 mg  before breakfast, 4 mg  after lunch, 4 mg after supper, and 4 mg  at bedtime Day 4: 4 mg  before breakfast, 4 mg  after lunch, and 4 mg at bedtime Day 5: 4 mg  before breakfast and 4 mg at bedtime Day 6: 4 mg  before breakfast 03/02/24   Melene Plan, DO  nystatin-triamcinolone (MYCOLOG II) cream APPLY TO THE AFFECTED AREA S  OF SKIN 2 TIMES PER DAY IN THE MORNING AND EVENING    [provider]  Omega-3 Fatty Acids (FISH OIL PO)     [provider]  pregabalin (LYRICA) 100 MG capsule Take 1  capsule by mouth 2 (two) times daily.    [provider]  pregabalin (LYRICA) 150 MG capsule     [provider]  pregabalin (LYRICA) 50 MG capsule Take 1 capsule (50 mg total) by mouth 2 (two) times daily. 01/10/23   Ranelle Oyster, MD  tretinoin (RETIN-A) 0.025 % cream Apply 1 application topically at bedtime.    [provider]  vitamin B-12 (CYANOCOBALAMIN) 1000 MCG tablet Take 1 tablet (1,000 mcg total) by mouth at bedtime. 04/23/18   Angiulli, Mcarthur Rossetti, PA-C  VITAMIN D PO     [provider]      Allergies    Butorphanol tartrate, Tramadol, Tetracycline hcl, Flexeril [cyclobenzaprine], Hydrochlorothiazide-triamterene, and Propranolol hcl    Review of Systems   Review of Systems  Constitutional:  Negative for fever.  Respiratory:  Positive for chest  tightness. Negative for shortness of breath.   Cardiovascular:  Negative for chest pain and leg swelling.  Gastrointestinal:  Negative for abdominal pain.  Neurological:        Paresthesia  All other systems reviewed and are negative.   Physical Exam Updated Vital Signs BP 121/80   Pulse (!) 51   Temp 98 F (36.7 C)   Resp 19   Ht 1.549 m (5\' 1" )   Wt 47.6 kg   SpO2 98%   BMI 19.84 kg/m  Physical Exam Vitals and nursing note reviewed.  Constitutional:      Appearance: She is well-developed. She is not ill-appearing.  HENT:     Head: Normocephalic and atraumatic.  Eyes:     Pupils: Pupils are equal, round, and reactive to light.  Cardiovascular:     Rate and Rhythm: Normal rate and regular rhythm.     Heart sounds: Normal heart sounds.  Pulmonary:     Effort: Pulmonary effort is normal. No respiratory distress.     Breath sounds: No wheezing.  Abdominal:     General: Bowel sounds are normal.     Palpations: Abdomen is soft.  Musculoskeletal:     Cervical back: Neck supple.  Skin:    General: Skin is warm and dry.  Neurological:     Mental Status: She is alert and oriented to person, place, and time.  Psychiatric:        Mood and Affect: Mood normal.     ED Results / Procedures / Treatments   Labs (all labs ordered are listed, but only abnormal results are displayed) Labs Reviewed  CBC WITH DIFFERENTIAL/PLATELET - Abnormal; Notable for the following components:      Result Value   MCHC 36.4 (*)    All other components within normal limits  BASIC METABOLIC PANEL WITH GFR - Abnormal; Notable for the following components:   Sodium 130 (*)    Potassium 3.3 (*)    Chloride 96 (*)    Glucose, Bld 114 (*)    All other components within normal limits  BRAIN NATRIURETIC PEPTIDE  TROPONIN I (HIGH SENSITIVITY)  TROPONIN I (HIGH SENSITIVITY)    EKG EKG Interpretation Date/Time:  Friday March 07 2024 00:00:49 EDT Ventricular Rate:  70 PR Interval:  154 QRS  Duration:  84 QT Interval:  406 QTC Calculation: 438 R Axis:   82  Text Interpretation: Normal sinus rhythm Nonspecific ST abnormality Abnormal ECG When compared with ECG of 06-Mar-2024 23:50, No significant change was found Confirmed by Ross Marcus ([id number]) on 03/07/2024 2:01:06 AM  Radiology DG Chest Portable 1 View Result Date: 03/07/2024  CLINICAL DATA:  CP EXAM: PORTABLE CHEST 1 VIEW COMPARISON:  Chest x-ray 03/29/2018 FINDINGS: The heart and mediastinal contours are unchanged. Atherosclerotic plaque. No focal consolidation. No pulmonary edema. No pleural effusion. No pneumothorax. No acute osseous abnormality. IMPRESSION: 1. No active disease. 2.  Aortic Atherosclerosis (ICD10-I70.0). Electronically Signed   By: Tish Frederickson M.D.   On: 03/07/2024 01:07    Procedures Procedures    Medications Ordered in ED Medications  aspirin chewable tablet 324 mg (324 mg Oral Given 03/07/24 0038)    ED Course/ Medical Decision Making/ A&P                                 Medical Decision Making Amount and/or Complexity of Data Reviewed Labs: ordered. Radiology: ordered.  Risk OTC drugs.   This patient presents to the ED for concern of chest pain, arm weakness, this involves an extensive number of treatment options, and is a complaint that carries with it a high risk of complications and morbidity.  I considered the following differential and admission for this acute, potentially life threatening condition.  The differential diagnosis includes ACS, PE, pneumothorax, pneumonia, symptoms related to known central cord syndrome  MDM:    This is a 64 year old female who presents with but new onset chest pain.  She is nontoxic-appearing and vital signs are reassuring.  EKG shows no evidence of acute ischemia or arrhythmia.  Troponin x 2 negative.  Chest x-ray without pneumothorax or pneumonia.  Regarding her paresthesias and weakness, she appears to be at baseline.  Recently had a full  workup including MRI that was reassuring.  She will follow-up with neurology.  Her primary physician is Dr. Algie Coffer.  Recommend close follow-up.  (Labs, imaging, consults)  Labs: I Ordered, and personally interpreted labs.  The pertinent results include: CBC, BMP, troponin x 2  Imaging Studies ordered: I ordered imaging studies including chest x-ray I independently visualized and interpreted imaging. I agree with the radiologist interpretation  Additional history obtained from chart review.  External records from outside source obtained and reviewed including recent MRI and workup  Cardiac Monitoring: The patient was maintained on a cardiac monitor.  If on the cardiac monitor, I personally viewed and interpreted the cardiac monitored which showed an underlying rhythm of: Sinus  Reevaluation: After the interventions noted above, I reevaluated the patient and found that they have :improved  Social Determinants of Health:  lives independently  Disposition: Discharge  Co morbidities that complicate the patient evaluation  Past Medical History:  Diagnosis Date   Asthma    child- occ exersie induced now   Family history of adverse reaction to anesthesia    dad hard to awaken   Hypertension    no rx  in 5 yrs fish oil helps now   Paralysis (HCC)      Medicines Meds ordered this encounter  Medications   aspirin chewable tablet 324 mg    I have reviewed the patients home medicines and have made adjustments as needed  Problem List / ED Course: Problem List Items Addressed This Visit   None Visit Diagnoses       Atypical chest pain    -  Primary                   Final Clinical Impression(s) / ED Diagnoses Final diagnoses:  Atypical chest pain    Rx / DC Orders ED Discharge Orders  Ordered    Ambulatory referral to Cardiology  Status:  Canceled        03/07/24 0416              Giovanie Lefebre, Mayer Masker, MD 03/07/24 [phone]

## 2024-04-11 ENCOUNTER — Telehealth: Payer: Self-pay | Admitting: Cardiovascular Disease

## 2024-04-11 NOTE — Telephone Encounter (Signed)
 LVM to schedule as NP with Dr. Jonelle Neri / Ok'd by provider

## 2024-04-14 DIAGNOSIS — E7849 Other hyperlipidemia: Secondary | ICD-10-CM | POA: Diagnosis not present

## 2024-04-14 DIAGNOSIS — D649 Anemia, unspecified: Secondary | ICD-10-CM | POA: Diagnosis not present

## 2024-04-14 DIAGNOSIS — E876 Hypokalemia: Secondary | ICD-10-CM | POA: Diagnosis not present

## 2024-04-14 DIAGNOSIS — Z79899 Other long term (current) drug therapy: Secondary | ICD-10-CM | POA: Diagnosis not present

## 2024-06-04 ENCOUNTER — Ambulatory Visit: Admitting: Family Medicine

## 2024-06-04 ENCOUNTER — Encounter: Payer: Self-pay | Admitting: Family Medicine

## 2024-06-04 VITALS — BP 110/72 | HR 53 | Temp 97.8°F | Ht 61.0 in | Wt 105.8 lb

## 2024-06-04 DIAGNOSIS — M792 Neuralgia and neuritis, unspecified: Secondary | ICD-10-CM

## 2024-06-04 DIAGNOSIS — S14125S 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 of cervical spinal cord, sequela: Secondary | ICD-10-CM

## 2024-06-04 DIAGNOSIS — L709 Acne, unspecified: Secondary | ICD-10-CM

## 2024-06-04 DIAGNOSIS — Z1231 Encounter for screening mammogram for malignant neoplasm of breast: Secondary | ICD-10-CM

## 2024-06-04 DIAGNOSIS — I10 Essential (primary) hypertension: Secondary | ICD-10-CM

## 2024-06-04 DIAGNOSIS — G959 Disease of spinal cord, unspecified: Secondary | ICD-10-CM | POA: Diagnosis not present

## 2024-06-04 DIAGNOSIS — K592 Neurogenic bowel, not elsewhere classified: Secondary | ICD-10-CM | POA: Diagnosis not present

## 2024-06-04 DIAGNOSIS — J45990 Exercise induced bronchospasm: Secondary | ICD-10-CM | POA: Diagnosis not present

## 2024-06-04 DIAGNOSIS — R252 Cramp and spasm: Secondary | ICD-10-CM | POA: Diagnosis not present

## 2024-06-04 DIAGNOSIS — N319 Neuromuscular dysfunction of bladder, unspecified: Secondary | ICD-10-CM

## 2024-06-04 DIAGNOSIS — Z1211 Encounter for screening for malignant neoplasm of colon: Secondary | ICD-10-CM

## 2024-06-04 NOTE — Patient Instructions (Signed)
 Please return in 6 months for your annual complete physical; please come fasting.    If you have any questions or concerns, please don't hesitate to send me a message via MyChart or call the office at [phone]. Thank you for visiting with us  today! It's our pleasure caring for you.   Please call the office checked below to schedule your appointment for your mammogram and/or bone density screen (the checked studies were ordered): [x]   Mammogram  []   Bone Density  [x]   [facility]     [address] [place], [place]        [phone]         []   [facility]  [address] [place], [place]  [phone]   We will call you with information regarding your referral appointment.  [place] [facility] for colonoscopy.  If you do not hear from us  within the next 2 weeks, please let me know. It can take 1-2 weeks to get appointments set up with the specialists.
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
## 2024-06-12 ENCOUNTER — Ambulatory Visit: Payer: Self-pay

## 2024-06-12 NOTE — Telephone Encounter (Signed)
 FYI Only or Action Required?: FYI only for provider.  Patient was last seen in primary care on 06/04/2024 by Jodie Lavern CROME, MD.  Called Nurse Triage reporting Hypertension.  Symptoms began several days ago.  Interventions attempted: Nothing.  Symptoms are: unchanged.  Triage Disposition: No disposition on file.  Patient/caregiver understands and will follow disposition?:                    Copied from CRM [id number]. Topic: Clinical - Red Word Triage >> Jun 12, 2024  2:17 PM Suzen RAMAN wrote: Red Word that prompted transfer to Nurse Triage: Elevated blood pressure(not improving with medication) Reason for Disposition  Systolic BP >= 160 OR Diastolic >= 100  Answer Assessment - Initial Assessment Questions 1. BLOOD PRESSURE: What is your blood pressure? Did you take at least two measurements 5 minutes apart?     161/86 2. ONSET: When did you take your blood pressure?     Few days 3. HOW: How did you take your blood pressure? (e.g., automatic home BP monitor, visiting nurse)     Yes, automatic 4. HISTORY: Do you have a history of high blood pressure?     yes 5. MEDICINES: Are you taking any medicines for blood pressure? Have you missed any doses recently?     A few days ago only took medications twice rather than three times 6. OTHER SYMPTOMS: Do you have any symptoms? (e.g., blurred vision, chest pain, difficulty breathing, headache, weakness)     Eyes watering 7. PREGNANCY: Is there any chance you are pregnant? When was your last menstrual period?     na  Protocols used: Blood Pressure - High-A-AH

## 2024-06-13 ENCOUNTER — Ambulatory Visit: Admitting: Family Medicine

## 2024-06-13 ENCOUNTER — Other Ambulatory Visit (HOSPITAL_COMMUNITY): Payer: Self-pay

## 2024-06-13 ENCOUNTER — Encounter (HOSPITAL_COMMUNITY): Payer: Self-pay

## 2024-06-13 ENCOUNTER — Encounter: Payer: Self-pay | Admitting: Family Medicine

## 2024-06-13 VITALS — BP 137/80 | HR 62 | Temp 97.9°F | Ht 61.0 in | Wt 105.6 lb

## 2024-06-13 DIAGNOSIS — I10 Essential (primary) hypertension: Secondary | ICD-10-CM

## 2024-06-13 DIAGNOSIS — M792 Neuralgia and neuritis, unspecified: Secondary | ICD-10-CM | POA: Diagnosis not present

## 2024-06-13 DIAGNOSIS — S14125S 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 of cervical spinal cord, sequela: Secondary | ICD-10-CM

## 2024-06-13 DIAGNOSIS — R252 Cramp and spasm: Secondary | ICD-10-CM | POA: Diagnosis not present

## 2024-06-13 MED ORDER — HYDROCHLOROTHIAZIDE 25 MG PO TABS
25.0000 mg | ORAL_TABLET | Freq: Every day | ORAL | 3 refills | Status: DC
Start: 2024-06-13 — End: 2024-06-16
  Filled 2024-06-13: qty 90, 90d supply, fill #0

## 2024-06-13 MED ORDER — NORTRIPTYLINE HCL 10 MG PO CAPS
10.0000 mg | ORAL_CAPSULE | Freq: Every day | ORAL | 3 refills | Status: DC
  Filled 2024-06-13: qty 90, 90d supply, fill #0

## 2024-06-16 ENCOUNTER — Other Ambulatory Visit: Payer: Self-pay

## 2024-06-16 ENCOUNTER — Other Ambulatory Visit (HOSPITAL_COMMUNITY): Payer: Self-pay

## 2024-06-16 ENCOUNTER — Other Ambulatory Visit (HOSPITAL_BASED_OUTPATIENT_CLINIC_OR_DEPARTMENT_OTHER): Payer: Self-pay

## 2024-06-16 ENCOUNTER — Telehealth: Payer: Self-pay

## 2024-06-16 MED ORDER — NORTRIPTYLINE HCL 10 MG PO CAPS: 10.0000 mg | ORAL_CAPSULE | Freq: Every day | ORAL | 3 refills | Status: AC

## 2024-06-16 MED ORDER — HYDROCHLOROTHIAZIDE 25 MG PO TABS: 25.0000 mg | ORAL_TABLET | Freq: Every day | ORAL | 3 refills | Status: DC

## 2024-06-16 NOTE — Progress Notes (Signed)
 Subjective  CC:  Chief Complaint  Patient presents with   Hypertension    Pt stated that her bp was 161/86 and 148/84 and that is with medication    HPI: Kelly Wall is a 63 y.o. female who presents to the office today to address the problems listed above in the chief complaint. Discussed the use of AI scribe software for clinical note transcription with the patient, who gave verbal consent to proceed.  History of Present Illness Kelly Wall is a 63 year old female with hypertension and neuropathy who presents with uncontrolled blood pressure and worsening neuropathy symptoms.  She has been experiencing difficulty controlling her blood pressure despite medication. She avoids adding salt to her food due to concerns about exacerbating her neuropathy. She has been taking valsartan 160 mg regularly, and hydralzaince which she started after an emergency room visit in April due to high blood pressure. Previously, she managed her blood pressure with fish oil for about 15 years until it became ineffective last year. She was also prescribed hydralazine  in the ER for rapid blood pressure control when her readings were as high as 170/?  Her neuropathy symptoms have worsened, including pain in her fingers and toes, difficulty lifting her leg, and muscle tightness in her chest and between her ribs. Her fingers hurt more during episodes of high blood pressure, according to her observations. She also experiences numbness in her face and swelling and watering of her eyes during episodes when her blood pressure is high, according to her report. She has reduced her Lyrica  dosage due to cognitive side effects, taking 50 mg once daily instead of twice.  She has a history of anxiety, for which she occasionally takes Xanax at night to aid sleep, although it causes dizziness. Her anxiety is not significant. She also experiences nightmares with certain medications like Xanax and baclofen . Her sleep  quality has declined with age, and she notes that she does not sleep well now that she is in her sixties.   Assessment  1. Essential hypertension   2. 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 Neuropathic pain   4. Spasticity      Plan  Assessment and Plan Assessment & Plan Hypertension - Discontinue hydralazine . - Initiate diuretic therapy with hydrochlorothiazide . - Continue valsartan 160 mg. - Educated on the impact of pain and anxiety on blood pressure.  Chronic Pain Chronic pain contributes to elevated blood pressure. Non-pharmacological interventions are preferred due to medication sensitivity. - Refer to Donny Backer for massage therapy and fascia release body work - Educated on the relationship between pain and blood pressure.  Chronic Neuropathy Chronic neuropathy with pain exacerbated by high blood pressure. Reducing Lyrica  due to cognitive side effects. Alternatives include carbamazepine and nortriptyline . - trial of low dose nortriptyline  for nerve pain. Can titrate dose up if she tolerates it. - Refer to Donny Backer for massage therapy and fascia release  Insomnia Difficulty sleeping related to anxiety and chronic pain. Nortriptyline  suggested as an alternative for sleep and pain management. - start low dose nortriptyline  for sleep and pain management.  Follow up: 6-12 weeks to recheck bp No orders of the defined types were placed in this encounter.  Meds ordered this encounter  Medications   nortriptyline  (PAMELOR ) 10 MG capsule    Sig: Take 1 capsule (10 mg total) by mouth at bedtime.    Dispense:  90 capsule    Refill:  3   hydrochlorothiazide  (HYDRODIURIL ) 25 MG tablet    Sig: Take 1 tablet (25 mg total) by mouth daily.    Dispense:  90 tablet    Refill:  3     I reviewed the patients updated PMH, FH, and SocHx.  Patient Active Problem List   Diagnosis Date Noted   Neuropathic pain 11/10/2020    Priority: High    Lumbar radiculopathy 10/15/2019    Priority: High   Cervical myelopathy (HCC) 04/02/2018    Priority: High   Essential hypertension     Priority: High   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 of cervical spinal cord (HCC) 03/30/2018    Priority: High   S/P cervical spinal fusion 04/20/2016    Priority: High   Cervical radicular pain 03/22/2016    Priority: High   Spasticity 01/10/2023    Priority: Medium    Spondylolisthesis of lumbar region 01/30/2020    Priority: Medium    Spondylosis of lumbar spine 08/06/2019    Priority: Medium    Lumbar facet arthropathy 02/05/2019    Priority: Medium    Discoloration of skin of toe 01/02/2021    Priority: Low   Exercise-induced asthma 06/04/2024   Adult acne 06/04/2024   Neurogenic bowel 06/04/2024   Neurogenic bladder 06/04/2024   Family history of neoplasm of ovary 08/23/2020   Uncomplicated asthma    Bradycardia    MIGRAINE HEADACHE 08/02/2007   Current Meds  Medication Sig   acetaminophen  (TYLENOL ) 500 MG tablet Take 500 mg by mouth at bedtime as needed for headache (pain).   baclofen  (LIORESAL ) 10 MG tablet TAKE 1 TABLET BY MOUTH EVERY 8 HOURS AS NEEDED FOR MUSCLE SPASMS.   Biotin  5000 MCG CAPS Take 5,000 mcg by mouth at bedtime.    Cholecalciferol  25 MCG (1000 UT) tablet Take 1,000 Units by mouth at bedtime.   clindamycin (CLEOCIN T) 1 % lotion Apply 1 application topically daily as needed (acne).    clindamycin-benzoyl peroxide (BENZACLIN) gel Apply 1 application topically every morning.   cyclobenzaprine  (FLEXERIL ) 5 MG tablet    diclofenac  (VOLTAREN ) 75 MG EC tablet    hydrochlorothiazide  (HYDRODIURIL ) 25 MG tablet Take 1 tablet (25 mg total) by mouth daily.   losartan  (COZAAR ) 50 MG tablet Take 1 tablet (50 mg total) by mouth daily.   nortriptyline  (PAMELOR ) 10 MG capsule Take 1 capsule (10 mg total) by mouth at bedtime.   Omega-3 Fatty Acids (FISH OIL PO)    pregabalin  (LYRICA ) 50 MG capsule Take 1 capsule (50 mg total) by  mouth 2 (two) times daily.   tretinoin  (RETIN-A ) 0.025 % cream Apply 1 application topically at bedtime.   vitamin B-12 (CYANOCOBALAMIN ) 1000 MCG tablet Take 1 tablet (1,000 mcg total) by mouth at bedtime.   VITAMIN D  PO    [DISCONTINUED] hydrALAZINE  (APRESOLINE ) 25 MG tablet Take 1 tablet (25 mg total) by mouth 3 (three) times daily.   Allergies: Patient is allergic to butorphanol tartrate, tramadol, tetracycline hcl, and propranolol hcl. Family History: Patient family history includes Hypertension in an other family member. Social History:  Patient  reports that she has never smoked. She has never used smokeless tobacco. She reports that she does not drink alcohol and does not use drugs.  Review of Systems: Constitutional: Negative for fever malaise or anorexia Cardiovascular: negative for chest pain Respiratory: negative for SOB or persistent cough Gastrointestinal: negative for abdominal pain  Objective  Vitals: BP 137/80   Pulse 62   Temp 97.9  F (36.6 C)   Ht 5' 1 (1.549 m)   Wt 105 lb 9.6 oz (47.9 kg)   SpO2 99%   BMI 19.95 kg/m  General: no acute distress , A&Ox3 HEENT: PEERL, conjunctiva normal, neck is supple Cardiovascular:  RRR without murmur or gallop.  Respiratory:  Good breath sounds bilaterally, CTAB with normal respiratory effort Skin:  Warm, no rashes Commons side effects, risks, benefits, and alternatives for medications and treatment plan prescribed today were discussed, and the patient expressed understanding of the given instructions. Patient is instructed to call or message via MyChart if he/she has any questions or concerns regarding our treatment plan. No barriers to understanding were identified. We discussed Red Flag symptoms and signs in detail. Patient expressed understanding regarding what to do in case of urgent or emergency type symptoms.  Medication list was reconciled, printed and provided to the patient in AVS. Patient instructions and summary  information was reviewed with the patient as documented in the AVS. This note was prepared with assistance of Dragon voice recognition software. Occasional wrong-word or sound-a-like substitutions may have occurred due to the inherent limitations of voice recognition software

## 2024-06-16 NOTE — Telephone Encounter (Signed)
 Copied from CRM [id number]. Topic: Clinical - Medication Question >> Jun 16, 2024 10:32 AM Viola F wrote: Reason for CRM: Patient needs the Hydrochlorothiazide  and Nortriptyline  medication resent to the [facility]  [address] [place]  [address] because it was sent to the incorrect pharmacy

## 2024-06-17 ENCOUNTER — Other Ambulatory Visit (HOSPITAL_COMMUNITY): Payer: Self-pay

## 2024-06-19 ENCOUNTER — Other Ambulatory Visit (HOSPITAL_COMMUNITY): Payer: Self-pay

## 2024-06-20 ENCOUNTER — Other Ambulatory Visit (HOSPITAL_COMMUNITY): Payer: Self-pay

## 2024-06-23 ENCOUNTER — Other Ambulatory Visit (HOSPITAL_COMMUNITY): Payer: Self-pay

## 2024-06-24 ENCOUNTER — Ambulatory Visit (INDEPENDENT_AMBULATORY_CARE_PROVIDER_SITE_OTHER): Admitting: Neurology

## 2024-06-24 ENCOUNTER — Other Ambulatory Visit (HOSPITAL_COMMUNITY): Payer: Self-pay

## 2024-06-24 ENCOUNTER — Encounter: Payer: Self-pay | Admitting: Neurology

## 2024-06-24 VITALS — BP 148/89 | HR 60 | Resp 14 | Ht 61.0 in | Wt 105.5 lb

## 2024-06-24 DIAGNOSIS — R419 Unspecified symptoms and signs involving cognitive functions and awareness: Secondary | ICD-10-CM

## 2024-06-24 DIAGNOSIS — R252 Cramp and spasm: Secondary | ICD-10-CM | POA: Diagnosis not present

## 2024-06-24 DIAGNOSIS — M792 Neuralgia and neuritis, unspecified: Secondary | ICD-10-CM

## 2024-06-24 DIAGNOSIS — R269 Unspecified abnormalities of gait and mobility: Secondary | ICD-10-CM

## 2024-06-24 DIAGNOSIS — S14125S 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 of cervical spinal cord, sequela: Secondary | ICD-10-CM

## 2024-06-24 NOTE — Patient Instructions (Signed)
 Continue to follow up with PCP  Continue with daily exercises  Return as needed

## 2024-06-24 NOTE — Progress Notes (Signed)
 [facility]  PATIENT: Kelly Wall DOB: 10/05/1961  REQUESTING CLINICIAN: Gillie Duncans, MD HISTORY FROM: Patient REASON FOR VISIT: Memory loss    HISTORICAL  CHIEF COMPLAINT:  Chief Complaint  Patient presents with   New Patient (Initial Visit)    Rm13, alone, referral for Cognitive impairment / Duncans Gillie MD [facility] Neurosurgery [phone]: MOCA 22    HISTORY OF PRESENT ILLNESS:  Discussed the use of AI scribe software for clinical note transcription with the patient, who gave verbal consent to proceed.  Kelly Wall is a 63 year old female with a history of cervical spine fracture C4-7, myelopathy and neuropathy who presents with memory issues and worsening nerve pain.  She experiences significant memory issues, which she attributes to her use of Lyrica  (pregabalin ). Since reducing her Lyrica  dose from 150 mg to 50 mg, her memory has slightly improved. She describes episodes where she forgets conversations until reminded by the presence of the person she spoke with.  She has a history of a cervical spine injury that resulted in quadriplegia, from which she has partially recovered. She experiences persistent nerve pain in both arms, spasms, and weakness, particularly on the right side. She also reports uncoordinated movements and loss of sensation, with her left leg frequently burning and her fingers and toes having reduced sensation. Her right side is weaker, and she has difficulty with coordination. She was prescribed Nortriptyline  but has not started it.   Her blood pressure has been elevated recently. She takes valsartan 160 mg and hydralazine  25 mg every four hours for blood pressure management. Despite this regimen, she feels her blood pressure rising before the next dose is due, which exacerbates her neuropathy and nerve pain. She was prescribed Hydrochlorothiazide  but has not started it  She has a history of falls, with the most  recent significant fall occurring last October, resulting in an inability to walk for a month. Her right leg is weaker and prone to giving out, and she has been working on strengthening it through exercise.  She lives alone, independent in her ADLs and has been applying for disability for over two years without success. She is concerned about her safety due to her fall risk and lack of immediate family support nearby. Her daughter resides in [place] , and her siblings, though local, are not actively involved in her care.      OTHER MEDICAL CONDITIONS: History of cervical spine injury, hypertension, gait abnormality   REVIEW OF SYSTEMS: Full 14 system review of systems performed and negative with exception of: As noted in the HPI   ALLERGIES: Allergies  Allergen Reactions   Butorphanol Tartrate Shortness Of Breath   Tramadol Shortness Of Breath   Tetracycline Hcl Other (See Comments)   Propranolol Hcl Other (See Comments)    Chest pain    HOME MEDICATIONS: Outpatient Medications Prior to Visit  Medication Sig Dispense Refill   acetaminophen  (TYLENOL ) 500 MG tablet Take 500 mg by mouth at bedtime as needed for headache (pain).     baclofen  (LIORESAL ) 10 MG tablet TAKE 1 TABLET BY MOUTH EVERY 8 HOURS AS NEEDED FOR MUSCLE SPASMS. 90 tablet 1   Biotin  5000 MCG CAPS Take 5,000 mcg by mouth at bedtime.      Cholecalciferol  25 MCG (1000 UT) tablet Take 1,000 Units by mouth at bedtime.     clindamycin (CLEOCIN T) 1 % lotion Apply 1 application topically daily as needed (acne).      clindamycin-benzoyl peroxide (BENZACLIN)  gel Apply 1 application topically every morning.     cyclobenzaprine  (FLEXERIL ) 5 MG tablet      diclofenac  (VOLTAREN ) 75 MG EC tablet      hydrochlorothiazide  (HYDRODIURIL ) 25 MG tablet Take 1 tablet (25 mg total) by mouth daily. 90 tablet 3   nortriptyline  (PAMELOR ) 10 MG capsule Take 1 capsule (10 mg total) by mouth at bedtime. 90 capsule 3   Omega-3 Fatty Acids (FISH  OIL PO)      pregabalin  (LYRICA ) 50 MG capsule Take 1 capsule (50 mg total) by mouth 2 (two) times daily. 180 capsule 2   tretinoin  (RETIN-A ) 0.025 % cream Apply 1 application topically at bedtime.     valsartan (DIOVAN) 160 MG tablet Take 160 mg by mouth daily.     vitamin B-12 (CYANOCOBALAMIN ) 1000 MCG tablet Take 1 tablet (1,000 mcg total) by mouth at bedtime. 30 tablet 0   VITAMIN D  PO      losartan  (COZAAR ) 50 MG tablet Take 1 tablet (50 mg total) by mouth daily. 90 tablet 2   No facility-administered medications prior to visit.    PAST MEDICAL HISTORY: Past Medical History:  Diagnosis Date   Asthma    child- occ exersie induced now   Family history of adverse reaction to anesthesia    dad hard to awaken   Hypertension    no rx  in 5 yrs fish oil helps now   Paralysis [facility])     PAST SURGICAL HISTORY: Past Surgical History:  Procedure Laterality Date   ANTERIOR CERVICAL DECOMP/DISCECTOMY FUSION N/A 03/30/2018   Procedure: ANTERIOR CERVICAL DECOMPRESSION/DISCECTOMY FUSION Cervical four-five and Cervical five-six;  Surgeon: Gillie Duncans, MD;  Location: [facility] OR;  Service: Neurosurgery;  Laterality: N/A;   BACK SURGERY     CERVICAL DISC ARTHROPLASTY N/A 04/20/2016   Procedure: Cervical Six-Seven Artificial Disc Replacement-Cervical;  Surgeon: Alm GORMAN Molt, MD;  Location: [facility];  Service: Neurosurgery;  Laterality: N/A;    FAMILY HISTORY: Family History  Problem Relation Age of Onset   Hypertension Other     SOCIAL HISTORY: Social History   Socioeconomic History   Marital status: Divorced    Spouse name: Not on file   Number of children: Not on file   Years of education: Not on file   Highest education level: Not on file  Occupational History   Occupation: ICU secretary  Tobacco Use   Smoking status: Never   Smokeless tobacco: Never  Vaping Use   Vaping status: Never Used  Substance and Sexual Activity   Alcohol use: No   Drug use: No   Sexual activity:  Yes    Birth control/protection: Condom  Other Topics Concern   Not on file  Social History Narrative   Regular exercise- yes   Social Drivers of Health   Financial Resource Strain: Not on file  Food Insecurity: Not on file  Transportation Needs: Not on file  Physical Activity: Not on file  Stress: Not on file  Social Connections: Not on file  Intimate Partner Violence: Not on file    PHYSICAL EXAM  GENERAL EXAM/CONSTITUTIONAL: Vitals:  Vitals:   06/24/24 0944 06/24/24 0949  BP: (!) 144/80 (!) 148/89  Pulse: 60   Resp: 14   Weight: 105 lb 8 oz (47.9 kg)   Height: 5' 1 (1.549 m)    Body mass index is 19.93 kg/m. Wt Readings from Last 3 Encounters:  06/24/24 105 lb 8 oz (47.9 kg)  06/13/24 105 lb 9.6  oz (47.9 kg)  06/04/24 105 lb 12.8 oz (48 kg)   Patient is in no distress; well developed, nourished and groomed; neck is supple  MUSCULOSKELETAL: Gait, strength, tone, movements noted in Neurologic exam below  NEUROLOGIC: MENTAL STATUS:      No data to display            06/24/2024    9:50 AM  Montreal Cognitive Assessment   Visuospatial/ Executive (0/5) 2  Naming (0/3) 3  Attention: Read list of digits (0/2) 2  Attention: Read list of letters (0/1) 1  Attention: Serial 7 subtraction starting at 100 (0/3) 0  Language: Repeat phrase (0/2) 0  Language : Fluency (0/1) 1  Abstraction (0/2) 2  Delayed Recall (0/5) 5  Orientation (0/6) 6  Total 22    awake, alert, oriented to person, place and time recent and remote memory intact normal attention and concentration language fluent, comprehension intact, naming intact fund of knowledge appropriate  CRANIAL NERVE:  2nd, 3rd, 4th, 6th- visual fields full to confrontation, extraocular muscles intact, no nystagmus 5th - facial sensation symmetric 7th - facial strength symmetric 8th - hearing intact 9th - palate elevates symmetrically, uvula midline 11th - shoulder shrug symmetric 12th - tongue protrusion  midline  MOTOR:  Increase in tone, mild right sided weakness. Right hip flexion 4+/5  GAIT/STATION:  Spastic gait      DIAGNOSTIC DATA (LABS, IMAGING, TESTING) - I reviewed patient records, labs, notes, testing and imaging myself where available.  Lab Results  Component Value Date   WBC 4.9 03/07/2024   HGB 14.2 03/07/2024   HCT 39.0 03/07/2024   MCV 90.1 03/07/2024   PLT 217 03/07/2024      Component Value Date/Time   NA 130 (L) 03/07/2024 0101   K 3.3 (L) 03/07/2024 0101   CL 96 (L) 03/07/2024 0101   CO2 25 03/07/2024 0101   GLUCOSE 114 (H) 03/07/2024 0101   BUN 8 03/07/2024 0101   CREATININE 0.55 03/07/2024 0101   CREATININE 0.68 06/23/2011 0845   CALCIUM  8.9 03/07/2024 0101   PROT 7.5 03/02/2024 1357   ALBUMIN  4.7 03/02/2024 1357   AST 26 03/02/2024 1357   ALT 18 03/02/2024 1357   ALKPHOS 57 03/02/2024 1357   BILITOT 0.7 03/02/2024 1357   GFRNONAA >60 03/07/2024 0101   GFRAA >60 01/28/2020 1329   Lab Results  Component Value Date   CHOL 190 12/30/2020   HDL 59.70 12/30/2020   LDLCALC 124 (H) 12/30/2020   TRIG 33.0 12/30/2020   CHOLHDL 3 12/30/2020   No results found for: HGBA1C Lab Results  Component Value Date   VITAMINB12 1,345 (H) 12/30/2020   Lab Results  Component Value Date   TSH 1.21 12/30/2020    MRI Brain 03/02/2024 1. Normal MRI appearance of the brain. No acute or focal lesion to explain the patient's symptoms. 2. Mild left ethmoid and maxillary sinus disease    ASSESSMENT AND PLAN  63 y.o. 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 Pain is persistent and does not fully resolve with Lyrica , which has been reduced due to memory impairment. Spasticity is  significant and causes discomfort. She has a high tolerance for pain but experiences increased pain with physical activity. She was prescribed Baclofen   but reports it causes bad dreams therefore has not been using it - Discuss alternative medications for nerve pain management with primary care provider, including duloxetine (Cymbalta) and nortriptyline  - I have advised her to start Nortriptyline  - Consider Duloxetine in the future  - Continue current blood pressure medications and monitor pain levels  Right lower extremity weakness and atrophy due to cervical spinal cord injury Right lower extremity weakness and atrophy following cervical spinal cord injury. She experiences difficulty with coordination and lifting the leg, requiring conscious effort to walk properly. Exercise is recommended to improve muscle strength and minimize limping, although full recovery is unlikely due to irreversible nerve damage. - Continue regular exercise regimen to strengthen right leg - Monitor for improvement in muscle strength and coordination  Fall risk due to neurological deficits Fall risk due to neurological deficits, including weakness, atrophy, and coordination issues. She has experienced falls and expresses concern about falling while living alone. - Continue using assistive devices like tricycle for mobility - Consider life alert system for emergency assistance  Hypertension, poorly controlled Poorly controlled hypertension, exacerbated by chronic pain. Current medications include valsartan and hydralazine , with recent addition of hydrochlorothiazide  suggested by primary care provider. She is hesitant to start new medications due to past side effects but plans to try them sequentially. Pain control is emphasized as a means to help manage blood pressure. 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 She reports improvement in memory with decreased Lyrica  dosage. Even though her MOCA score was low today, I do believe she has normal cognitive. She lost most point with attention (claims that she is not good with math) - Continue reduced Lyrica  dosage  Anxiety related to medical condition and living alone Anxiety related to medical condition and living alone, exacerbated by fear of falling and lack of immediate support. She expresses concern about being alone during emergencies.     1. Neuropathic pain   2. Cognitive complaints with normal exam   3. 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 Spasticity   5. Abnormal gait      Patient Instructions  Continue to follow up with PCP  Continue with daily exercises  Return as needed   No orders of the defined types were placed in this encounter.   No orders of the defined types were placed in this encounter.   Return if symptoms worsen or fail to improve.  I have spent a total of 65 minutes dedicated to this patient today, preparing to see patient, performing a medically appropriate examination and evaluation, ordering tests and/or medications and procedures, and counseling and educating the patient/family/caregiver; independently interpreting result and communicating results to the family/patient/caregiver; and documenting clinical information in the electronic medical record.   Pastor Falling, MD 06/24/2024, 1:32 PM  [facility] [address], [address] [place], [place] [address] [phone]

## 2024-07-02 ENCOUNTER — Other Ambulatory Visit: Payer: Self-pay | Admitting: Family Medicine

## 2024-07-02 MED ORDER — VALSARTAN 160 MG PO TABS: 160.0000 mg | ORAL_TABLET | Freq: Every day | ORAL | 0 refills | Status: DC

## 2024-07-02 NOTE — Telephone Encounter (Signed)
 Copied from CRM [id number]. Topic: Clinical - Medication Refill >> Jul 02, 2024 10:46 AM Carlyon D wrote: Medication: valsartan  (DIOVAN ) 160 MG tablet  Has the patient contacted their pharmacy? Yes (Agent: If no, request that the patient contact the pharmacy for the refill. If patient does not wish to contact the pharmacy document the reason why and proceed with request.) (Agent: If yes, when and what did the pharmacy advise?)  This is the patient's preferred pharmacy:  [facility] [id number] [place], [place] - [address] [place] [address] Phone: [phone] Fax: [phone]    Is this the correct pharmacy for this prescription? Yes If no, delete pharmacy and type the correct one.   Has the prescription been filled recently? No  Is the patient out of the medication? No, pt has 5 left but is leaving out of state and needs a refill   Has the patient been seen for an appointment in the last year OR does the patient have an upcoming appointment? Yes  Can we respond through MyChart? Yes  Agent: Please be advised that Rx refills may take up to 3 business days. We ask that you follow-up with your pharmacy.

## 2024-07-17 ENCOUNTER — Encounter: Payer: Self-pay | Admitting: Pediatrics

## 2024-07-21 ENCOUNTER — Other Ambulatory Visit: Payer: Self-pay | Admitting: Family Medicine

## 2024-07-25 ENCOUNTER — Ambulatory Visit: Admitting: Family Medicine

## 2024-07-25 ENCOUNTER — Encounter: Payer: Self-pay | Admitting: Family Medicine

## 2024-07-25 VITALS — BP 139/82 | HR 60 | Temp 97.8°F | Ht 61.0 in | Wt 105.6 lb

## 2024-07-25 DIAGNOSIS — I10 Essential (primary) hypertension: Secondary | ICD-10-CM

## 2024-07-25 DIAGNOSIS — S14125S 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 of cervical spinal cord, sequela: Secondary | ICD-10-CM

## 2024-07-25 DIAGNOSIS — M792 Neuralgia and neuritis, unspecified: Secondary | ICD-10-CM | POA: Diagnosis not present

## 2024-07-28 ENCOUNTER — Other Ambulatory Visit: Payer: Self-pay | Admitting: Family Medicine

## 2024-07-28 NOTE — Progress Notes (Signed)
 Subjective  CC:  Chief Complaint  Patient presents with   Hypertension    HPI: Kelly Wall is a 63 y.o. female who presents to the office today to address the problems listed above in the chief complaint. Hypertension f/u: Control is fair . Pt reports she is doing well. taking medications as instructed, no medication side effects noted except feels more drowsy on hydrochlorothiazide ,  no TIAs, no chest pain on exertion, no dyspnea on exertion, no swelling of ankles. No longer checking bp at home. She denies adverse effects from his BP medications. Compliance with medication is good.  Neuropathic pain: hasn't yet started the pamelor  at night. Pain is better overall though. Seeing fascia release specialist with positive results.   Assessment  1. Essential hypertension   2. 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 Neuropathic pain      Plan   Hypertension f/u: BP control is fairly well controlled. Continue valsartan  and hydrochlorothiazide ; change hydrochlorothiazide  to evening to see if helps manage side effect of drowsiness. Neuropathic pain: will try pamelor .   Education regarding management of these chronic disease states was given. Management strategies discussed on successive visits include dietary and exercise recommendations, goals of achieving and maintaining IBW, and lifestyle modifications aiming for adequate sleep and minimizing stressors.   Follow up: 3 mo for recheck  No orders of the defined types were placed in this encounter.  No orders of the defined types were placed in this encounter.     BP Readings from Last 3 Encounters:  07/25/24 139/82  06/24/24 (!) 148/89  06/13/24 137/80   Wt Readings from Last 3 Encounters:  07/25/24 105 lb 9.6 oz (47.9 kg)  06/24/24 105 lb 8 oz (47.9 kg)  06/13/24 105 lb 9.6 oz (47.9 kg)    Lab Results  Component Value Date   CHOL 190 12/30/2020   CHOL 179 06/23/2011   Lab Results   Component Value Date   HDL 59.70 12/30/2020   HDL 57 06/23/2011   Lab Results  Component Value Date   LDLCALC 124 (H) 12/30/2020   LDLCALC 115 (H) 06/23/2011   Lab Results  Component Value Date   TRIG 33.0 12/30/2020   TRIG 37 06/23/2011   Lab Results  Component Value Date   CHOLHDL 3 12/30/2020   CHOLHDL 3.1 06/23/2011   No results found for: LDLDIRECT Lab Results  Component Value Date   CREATININE 0.55 03/07/2024   BUN 8 03/07/2024   NA 130 (L) 03/07/2024   K 3.3 (L) 03/07/2024   CL 96 (L) 03/07/2024   CO2 25 03/07/2024    The ASCVD Risk score (Arnett DK, et al., 2019) failed to calculate for the following reasons:   Cannot find a previous HDL lab   Cannot find a previous total cholesterol lab  I reviewed the patients updated PMH, FH, and SocHx.    Patient Active Problem List   Diagnosis Date Noted   Neuropathic pain 11/10/2020    Priority: High   Lumbar radiculopathy 10/15/2019    Priority: High   Cervical myelopathy (HCC) 04/02/2018    Priority: High   Essential hypertension     Priority: High   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 of cervical spinal cord (HCC) 03/30/2018    Priority: High   S/P cervical spinal fusion 04/20/2016    Priority: High   Cervical radicular pain 03/22/2016    Priority: High   Spasticity 01/10/2023  Priority: Medium    Spondylolisthesis of lumbar region 01/30/2020    Priority: Medium    Spondylosis of lumbar spine 08/06/2019    Priority: Medium    Lumbar facet arthropathy 02/05/2019    Priority: Medium    Discoloration of skin of toe 01/02/2021    Priority: Low   Exercise-induced asthma 06/04/2024   Adult acne 06/04/2024   Neurogenic bowel 06/04/2024   Neurogenic bladder 06/04/2024   Family history of neoplasm of ovary 08/23/2020   Uncomplicated asthma    Bradycardia    MIGRAINE HEADACHE 08/02/2007    Allergies: Butorphanol tartrate, Tramadol, Tetracycline hcl, and Propranolol hcl  Social History: Patient   reports that she has never smoked. She has never used smokeless tobacco. She reports that she does not drink alcohol and does not use drugs.  Current Meds  Medication Sig   acetaminophen  (TYLENOL ) 500 MG tablet Take 500 mg by mouth at bedtime as needed for headache (pain).   baclofen  (LIORESAL ) 10 MG tablet TAKE 1 TABLET BY MOUTH EVERY 8 HOURS AS NEEDED FOR MUSCLE SPASMS.   Biotin  5000 MCG CAPS Take 5,000 mcg by mouth at bedtime.    Cholecalciferol  25 MCG (1000 UT) tablet Take 1,000 Units by mouth at bedtime.   clindamycin (CLEOCIN T) 1 % lotion Apply 1 application topically daily as needed (acne).    clindamycin-benzoyl peroxide (BENZACLIN) gel Apply 1 application topically every morning.   cyclobenzaprine  (FLEXERIL ) 5 MG tablet    diclofenac  (VOLTAREN ) 75 MG EC tablet    hydrochlorothiazide  (HYDRODIURIL ) 25 MG tablet Take 1 tablet (25 mg total) by mouth daily.   nortriptyline  (PAMELOR ) 10 MG capsule Take 1 capsule (10 mg total) by mouth at bedtime.   Omega-3 Fatty Acids (FISH OIL PO)    pregabalin  (LYRICA ) 50 MG capsule Take 1 capsule (50 mg total) by mouth 2 (two) times daily.   tretinoin  (RETIN-A ) 0.025 % cream Apply 1 application topically at bedtime.   valsartan  (DIOVAN ) 160 MG tablet Take 1 tablet (160 mg total) by mouth daily.   vitamin B-12 (CYANOCOBALAMIN ) 1000 MCG tablet Take 1 tablet (1,000 mcg total) by mouth at bedtime.   VITAMIN D  PO     Review of Systems: Cardiovascular: negative for chest pain, palpitations, leg swelling, orthopnea Respiratory: negative for SOB, wheezing or persistent cough Gastrointestinal: negative for abdominal pain Genitourinary: negative for dysuria or gross hematuria  Objective  Vitals: BP 139/82   Pulse 60   Temp 97.8 F (36.6 C)   Ht 5' 1 (1.549 m)   Wt 105 lb 9.6 oz (47.9 kg)   SpO2 100%   BMI 19.95 kg/m  General: no acute distress  Psych:  Alert and oriented, normal mood and affect HEENT:  Normocephalic, atraumatic, supple neck   Cardiovascular:  RRR without murmur. no edema Respiratory:  Good breath sounds bilaterally, CTAB with normal respiratory effort Neurologic:   Mental status is normal Commons side effects, risks, benefits, and alternatives for medications and treatment plan prescribed today were discussed, and the patient expressed understanding of the given instructions. Patient is instructed to call or message via MyChart if he/she has any questions or concerns regarding our treatment plan. No barriers to understanding were identified. We discussed Red Flag symptoms and signs in detail. Patient expressed understanding regarding what to do in case of urgent or emergency type symptoms.  Medication list was reconciled, printed and provided to the patient in AVS. Patient instructions and summary information was reviewed with the patient as documented in  the AVS. This note was prepared with assistance of Dragon voice recognition software. Occasional wrong-word or sound-a-like substitutions may have occurred due to the inherent limitation

## 2024-08-06 ENCOUNTER — Telehealth: Payer: Self-pay

## 2024-08-06 ENCOUNTER — Encounter
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
## 2024-08-21 ENCOUNTER — Encounter: Admitting: Pediatrics

## 2024-09-29 NOTE — Progress Notes (Unsigned)
 [facility] Initial Consultation   Referring Provider Jodie Lavern CROME, MD [address] [place],  [place] [address]  Primary Care Provider Jodie Lavern CROME, MD  Patient Profile: Kelly Wall is a 63 y.o. female who is seen in consultation in the [facility] at the request of Dr. Jodie for evaluation and management of the problem(s) noted below.  Problem List: Colorectal cancer screening Neurogenic bowel  History of Present Illness   Kelly Wall is a 63 y.o. female with a history of spinal cord injury, HTN, exercise-induced asthma, neuropathic pain, tachycardia, neurogenic bowel and bladder.  Discussed the use of AI scribe software for clinical note transcription with the patient, who gave verbal consent to proceed.  History of Present Illness       GI Review of Symptoms Significant for {GIROS:50592}. Otherwise negative.  General Review of Systems  Review of systems is significant for the pertinent positives and negatives as listed per the HPI.  Full ROS is otherwise negative.  Past Medical History   Past Medical History:  Diagnosis Date   Asthma    child- occ exersie induced now   Family history of adverse reaction to anesthesia    dad hard to awaken   Hypertension    no rx  in 5 yrs fish oil helps now   Paralysis [facility])      Past Surgical History   Past Surgical History:  Procedure Laterality Date   ANTERIOR CERVICAL DECOMP/DISCECTOMY FUSION N/A 03/30/2018   Procedure: ANTERIOR CERVICAL DECOMPRESSION/DISCECTOMY FUSION Cervical four-five and Cervical five-six;  Surgeon: Gillie Duncans, MD;  Location: [facility] OR;  Service: Neurosurgery;  Laterality: N/A;   BACK SURGERY     CERVICAL DISC ARTHROPLASTY N/A 04/20/2016   Procedure: Cervical Six-Seven Artificial Disc Replacement-Cervical;  Surgeon: Alm GORMAN Molt, MD;  Location: [facility] ORS;  Service: Neurosurgery;  Laterality: N/A;     Allergies and Medications   Allergies  Allergen  Reactions   Butorphanol Tartrate Shortness Of Breath   Tramadol Shortness Of Breath   Tetracycline Hcl Other (See Comments)   Propranolol Hcl Other (See Comments)    Chest pain    @MEDSTODAY @  Family History   Family History  Problem Relation Age of Onset   Hypertension Other      Social History   Social History   Tobacco Use   Smoking status: Never   Smokeless tobacco: Never  Vaping Use   Vaping status: Never Used  Substance Use Topics   Alcohol use: No   Drug use: No   Kelly Wall reports that she has never smoked. She has never used smokeless tobacco. She reports that she does not drink alcohol and does not use drugs.  Vital Signs and Physical Examination   There were no vitals filed for this visit. There is no height or weight on file to calculate BMI.    General: Well developed, well nourished, no acute distress Head: Normocephalic and atraumatic Eyes: Sclerae anicteric, EOMI Ears: Normal auditory acuity Mouth: No deformities or lesions noted Lungs: Clear throughout to auscultation Heart: Regular rate and rhythm; No murmurs, rubs or bruits Abdomen: Soft, non tender and non distended. No masses, hepatosplenomegaly or hernias noted. Normal Bowel sounds Rectal: Musculoskeletal: Symmetrical with no gross deformities  Pulses:  Normal pulses noted Extremities: No edema or deformities noted Neurological: Alert oriented x 4, grossly nonfocal Psychological:  Alert and cooperative. Normal mood and affect  Review of Data  The following data was reviewed at the time of this  encounter:  Laboratory Studies      Latest Ref Rng & Units 03/07/2024    1:01 AM 03/02/2024    1:57 PM 12/30/2020    2:57 PM  CBC  WBC 4.0 - 10.5 K/uL 4.9  4.9  7.0   Hemoglobin 12.0 - 15.0 g/dL 85.7  85.2  86.0   Hematocrit 36.0 - 46.0 % 39.0  41.7  40.2   Platelets 150 - 400 K/uL 217  235  329.0     No results found for: LIPASE    Latest Ref Rng & Units 03/07/2024    1:01 AM 03/02/2024     1:57 PM 01/30/2020    6:26 PM  CMP  Glucose 70 - 99 mg/dL 885  90  860   BUN 8 - 23 mg/dL 8  5  6    Creatinine 0.44 - 1.00 mg/dL 9.44  9.36  9.49   Sodium 135 - 145 mmol/L 130  128  136   Potassium 3.5 - 5.1 mmol/L 3.3  3.6  4.3   Chloride 98 - 111 mmol/L 96  94  101   CO2 22 - 32 mmol/L 25  27    Calcium  8.9 - 10.3 mg/dL 8.9  9.3    Total Protein 6.5 - 8.1 g/dL  7.5    Total Bilirubin 0.0 - 1.2 mg/dL  0.7    Alkaline Phos 38 - 126 U/L  57    AST 15 - 41 U/L  26    ALT 0 - 44 U/L  18       Imaging Studies    GI Procedures and Studies    Colonoscopy 01/2014 Roundworms identified in cecum, otherwise normal  Clinical Impression  It is my clinical impression that [name] is a 63 y.o. female with;  ***  Plan  *** *** *** *** ***  Planned Follow Up No follow-ups on file.  The patient or caregiver verbalized understanding of the material covered, with no barriers to understanding. All questions were answered. Patient or caregiver is agreeable with the plan outlined above.    It was a pleasure to see Kelly Wall.  If you have any questions or concerns regarding this evaluation, do not hesitate to contact me.  Inocente Hausen, MD [facility]

## 2024-10-01 ENCOUNTER — Encounter: Payer: Self-pay | Admitting: Pediatrics

## 2024-10-01 ENCOUNTER — Ambulatory Visit (INDEPENDENT_AMBULATORY_CARE_PROVIDER_SITE_OTHER): Admitting: Pediatrics

## 2024-10-01 VITALS — BP 132/78 | HR 58 | Ht 61.0 in | Wt 108.2 lb

## 2024-10-01 DIAGNOSIS — K592 Neurogenic bowel, not elsewhere classified: Secondary | ICD-10-CM

## 2024-10-01 DIAGNOSIS — Z1211 Encounter for screening for malignant neoplasm of colon: Secondary | ICD-10-CM | POA: Diagnosis not present

## 2024-10-01 NOTE — Patient Instructions (Signed)
 Your provider has ordered Cologuard testing as an option for colon cancer screening. This is performed by [employer] and may be out of network with your insurance. PRIOR to completing the test, it is YOUR responsibility to contact your insurance about covered benefits for this test. Your out of pocket expense could be anywhere from $0.00 to $649.00.   When you call to check coverage with your insurer, please provide the following information:   -The ONLY provider of Cologuard is [employer], Dispensing  - CPT code for Cologuard is [phone].  [employer] NPI # [id number]  -[employer] Tax ID # [id number]   We have already sent your demographic and insurance information to [employer] (phone number [phone]) and they should contact you within the next week regarding your test. If you have not heard from them within the next week, please call our office at [phone].  Follow up as needed.  Thank you for entrusting me with your care and for choosing [facility], Dr. Inocente Hausen  _______________________________________________________  If your blood pressure at your visit was 140/90 or greater, please contact your primary care physician to follow up on this.  _______________________________________________________  If you are age 40 or older, your body mass index should be between 23-30. Your Body mass index is 20.45 kg/m. If this is out of the aforementioned range listed, please consider follow up with your Primary Care Provider.  If you are age 16 or younger, your body mass index should be between 19-25. Your Body mass index is 20.45 kg/m. If this is out of the aformentioned range listed, please consider follow up with your Primary Care Provider.   ________________________________________________________  The [facility] providers would like to encourage you to use MYCHART to communicate with providers for non-urgent requests or  questions.  Due to long hold times on the telephone, sending your provider a message by [facility] may be a faster and more efficient way to get a response.  Please allow 48 business hours for a response.  Please remember that this is for non-urgent requests.  _______________________________________________________  [facility] is using a team-based approach to care.  Your team is made up of your doctor and two to three APPS. Our APPS (Nurse Practitioners and Physician Assistants) work with your physician to ensure care continuity for you. They are fully qualified to address your health concerns and develop a treatment plan. They communicate directly with your gastroenterologist to care for you. Seeing the Advanced Practice Practitioners on your physician's team can help you by facilitating care more promptly, often allowing for earlier appointments, access to diagnostic testing, procedures, and other specialty referrals.

## 2024-10-10 ENCOUNTER — Ambulatory Visit: Admitting: Family Medicine

## 2024-10-10 ENCOUNTER — Encounter: Payer: Self-pay | Admitting: Family Medicine

## 2024-10-10 VITALS — BP 146/88 | HR 57 | Temp 97.9°F | Ht 61.0 in | Wt 110.6 lb

## 2024-10-10 DIAGNOSIS — Z23 Encounter for immunization: Secondary | ICD-10-CM

## 2024-10-10 DIAGNOSIS — R252 Cramp and spasm: Secondary | ICD-10-CM

## 2024-10-10 DIAGNOSIS — I10 Essential (primary) hypertension: Secondary | ICD-10-CM

## 2024-10-10 MED ORDER — VALSARTAN 320 MG PO TABS: 320.0000 mg | ORAL_TABLET | Freq: Every day | ORAL | 3 refills | Status: DC

## 2024-10-10 NOTE — Progress Notes (Signed)
 Subjective  CC:  Chief Complaint  Patient presents with   Hypertension    HPI: Kelly Wall is a 63 y.o. female who presents to the office today to address the problems listed above in the chief complaint. Hypertension f/u: Control is fair . Pt reports she is doing well overwell. Bp was well controlled on valsartan  160 and hydrochlorothiazide  25. However, she had worsening mm spasms and dysequilibrium with the hydrochlorothiazide . Once stopped, sxs improved. Of note, her neuropathy pains improve with improved bp control. She tolerates the valsartan  well.  Spasticity from spinal cord injury persists. Does better with bp control. No new sxs.  Due flu shot.   Assessment  1. Essential hypertension   2. Spasticity   3. Need for influenza vaccination      Plan   Hypertension f/u: BP control is fairly well controlled. Will increase valsartan  dose to 320mg  daily to get to good control. She should be able to tolerate this well. Continue exercise.  Neurospasticity: on chronic meds. Monitor.  Flu shot updated.   Education regarding management of these chronic disease states was given. Management strategies discussed on successive visits include dietary and exercise recommendations, goals of achieving and maintaining IBW, and lifestyle modifications aiming for adequate sleep and minimizing stressors.   Follow up: as scheduled  Orders Placed This Encounter  Procedures   Flu vaccine trivalent PF, 6mos and older(Flulaval,Afluria,Fluarix,Fluzone)   Meds ordered this encounter  Medications   valsartan  (DIOVAN ) 320 MG tablet    Sig: Take 1 tablet (320 mg total) by mouth daily.    Dispense:  90 tablet    Refill:  3      BP Readings from Last 3 Encounters:  10/10/24 (!) 146/88  10/01/24 132/78  07/25/24 139/82   Wt Readings from Last 3 Encounters:  10/10/24 110 lb 9.6 oz (50.2 kg)  10/01/24 108 lb 4 oz (49.1 kg)  07/25/24 105 lb 9.6 oz (47.9 kg)    Lab Results  Component  Value Date   CHOL 190 12/30/2020   CHOL 179 06/23/2011   Lab Results  Component Value Date   HDL 59.70 12/30/2020   HDL 57 06/23/2011   Lab Results  Component Value Date   LDLCALC 124 (H) 12/30/2020   LDLCALC 115 (H) 06/23/2011   Lab Results  Component Value Date   TRIG 33.0 12/30/2020   TRIG 37 06/23/2011   Lab Results  Component Value Date   CHOLHDL 3 12/30/2020   CHOLHDL 3.1 06/23/2011   No results found for: LDLDIRECT Lab Results  Component Value Date   CREATININE 0.55 03/07/2024   BUN 8 03/07/2024   NA 130 (L) 03/07/2024   K 3.3 (L) 03/07/2024   CL 96 (L) 03/07/2024   CO2 25 03/07/2024    The ASCVD Risk score (Arnett DK, et al., 2019) failed to calculate for the following reasons:   Cannot find a previous HDL lab   Cannot find a previous total cholesterol lab  I reviewed the patients updated PMH, FH, and SocHx.    Patient Active Problem List   Diagnosis Date Noted   Neuropathic pain 11/10/2020    Priority: High   Lumbar radiculopathy 10/15/2019    Priority: High   Cervical myelopathy (HCC) 04/02/2018    Priority: High   Essential hypertension     Priority: High   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 of cervical spinal cord (HCC) 03/30/2018    Priority: High   S/P cervical spinal fusion 04/20/2016  Priority: High   Cervical radicular pain 03/22/2016    Priority: High   Spasticity 01/10/2023    Priority: Medium    Spondylolisthesis of lumbar region 01/30/2020    Priority: Medium    Spondylosis of lumbar spine 08/06/2019    Priority: Medium    Lumbar facet arthropathy 02/05/2019    Priority: Medium    Discoloration of skin of toe 01/02/2021    Priority: Low   Exercise-induced asthma 06/04/2024   Adult acne 06/04/2024   Neurogenic bowel 06/04/2024   Neurogenic bladder 06/04/2024   Family history of neoplasm of ovary 08/23/2020   Uncomplicated asthma    Bradycardia    MIGRAINE HEADACHE 08/02/2007    Allergies: Butorphanol tartrate,  Tramadol, Tetracycline hcl, and Propranolol hcl  Social History: Patient  reports that she has never smoked. She has never used smokeless tobacco. She reports that she does not drink alcohol and does not use drugs.  Current Meds  Medication Sig   acetaminophen  (TYLENOL ) 500 MG tablet Take 500 mg by mouth at bedtime as needed for headache (pain).   baclofen  (LIORESAL ) 10 MG tablet TAKE 1 TABLET BY MOUTH EVERY 8 HOURS AS NEEDED FOR MUSCLE SPASMS.   Biotin  5000 MCG CAPS Take 5,000 mcg by mouth at bedtime.    Cholecalciferol  25 MCG (1000 UT) tablet Take 1,000 Units by mouth at bedtime.   clindamycin (CLEOCIN T) 1 % lotion Apply 1 application topically daily as needed (acne).    clindamycin-benzoyl peroxide (BENZACLIN) gel Apply 1 application topically every morning.   cyclobenzaprine  (FLEXERIL ) 5 MG tablet    diclofenac  (VOLTAREN ) 75 MG EC tablet    nortriptyline  (PAMELOR ) 10 MG capsule Take 1 capsule (10 mg total) by mouth at bedtime.   Omega-3 Fatty Acids (FISH OIL PO)    pregabalin  (LYRICA ) 50 MG capsule Take 1 capsule (50 mg total) by mouth 2 (two) times daily.   tretinoin  (RETIN-A ) 0.025 % cream Apply 1 application topically at bedtime.   valsartan  (DIOVAN ) 320 MG tablet Take 1 tablet (320 mg total) by mouth daily.   vitamin B-12 (CYANOCOBALAMIN ) 1000 MCG tablet Take 1 tablet (1,000 mcg total) by mouth at bedtime.   VITAMIN D  PO    [DISCONTINUED] hydrochlorothiazide  (HYDRODIURIL ) 25 MG tablet Take 1 tablet (25 mg total) by mouth daily.   [DISCONTINUED] valsartan  (DIOVAN ) 160 MG tablet TAKE 1 TABLET BY MOUTH DAILY    Review of Systems: Cardiovascular: negative for chest pain, palpitations, leg swelling, orthopnea Respiratory: negative for SOB, wheezing or persistent cough Gastrointestinal: negative for abdominal pain Genitourinary: negative for dysuria or gross hematuria  Objective  Vitals: BP (!) 146/88   Pulse (!) 57   Temp 97.9 F (36.6 C)   Ht 5' 1 (1.549 m)   Wt 110 lb 9.6  oz (50.2 kg)   SpO2 99%   BMI 20.90 kg/m  General: no acute distress  Psych:  Alert and oriented, normal mood and affect Mm stiffness present.  Cor: rrr Commons side effects, risks, benefits, and alternatives for medications and treatment plan prescribed today were discussed, and the patient expressed understanding of the given instructions. Patient is instructed to call or message via MyChart if he/she has any questions or concerns regarding our treatment plan. No barriers to understanding were identified. We discussed Red Flag symptoms and signs in detail. Patient expressed understanding regarding what to do in case of urgent or emergency type symptoms.  Medication list was reconciled, printed and provided to the patient in AVS. Patient instructions  and summary information was reviewed with the patient as documented in the AVS. This note was prepared with assistance of Dragon voice recognition software. Occasional wrong-word or sound-a-like substitutions may have occurred due to the inherent limitation

## 2024-10-10 NOTE — Patient Instructions (Signed)
Please follow up if symptoms do not improve or as needed.   

## 2024-10-15 DIAGNOSIS — Z1211 Encounter for screening for malignant neoplasm of colon: Secondary | ICD-10-CM | POA: Diagnosis not present

## 2024-10-28 ENCOUNTER — Ambulatory Visit: Payer: Self-pay | Admitting: Pediatrics

## 2024-10-28 LAB — COLOGUARD: COLOGUARD: NEGATIVE

## 2024-12-09 ENCOUNTER — Encounter: Payer: Self-pay | Admitting: Family Medicine

## 2024-12-25 ENCOUNTER — Encounter: Payer: Self-pay | Admitting: Family Medicine

## 2024-12-25 ENCOUNTER — Ambulatory Visit: Admitting: Family Medicine

## 2024-12-25 VITALS — BP 122/76 | HR 54 | Temp 97.9°F | Ht 61.0 in | Wt 108.8 lb

## 2024-12-25 DIAGNOSIS — S14125S 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 of cervical spinal cord, sequela: Secondary | ICD-10-CM

## 2024-12-25 DIAGNOSIS — Z23 Encounter for immunization: Secondary | ICD-10-CM | POA: Diagnosis not present

## 2024-12-25 DIAGNOSIS — Z0001 Encounter for general adult medical examination with abnormal findings: Secondary | ICD-10-CM | POA: Diagnosis not present

## 2024-12-25 DIAGNOSIS — M792 Neuralgia and neuritis, unspecified: Secondary | ICD-10-CM

## 2024-12-25 DIAGNOSIS — K592 Neurogenic bowel, not elsewhere classified: Secondary | ICD-10-CM

## 2024-12-25 DIAGNOSIS — I10 Essential (primary) hypertension: Secondary | ICD-10-CM | POA: Diagnosis not present

## 2024-12-25 DIAGNOSIS — N319 Neuromuscular dysfunction of bladder, unspecified: Secondary | ICD-10-CM | POA: Diagnosis not present

## 2024-12-25 DIAGNOSIS — M5412 Radiculopathy, cervical region: Secondary | ICD-10-CM

## 2024-12-25 LAB — LIPID PANEL
Cholesterol: 245 mg/dL — ABNORMAL HIGH (ref 28–200)
HDL: 57.7 mg/dL
LDL Cholesterol: 179 mg/dL — ABNORMAL HIGH (ref 10–99)
NonHDL: 186.92
Total CHOL/HDL Ratio: 4
Triglycerides: 40 mg/dL (ref 10.0–149.0)
VLDL: 8 mg/dL (ref 0.0–40.0)

## 2024-12-25 LAB — CBC WITH DIFFERENTIAL/PLATELET
Basophils Absolute: 0 10*3/uL (ref 0.0–0.1)
Basophils Relative: 0.9 % (ref 0.0–3.0)
Eosinophils Absolute: 0.2 10*3/uL (ref 0.0–0.7)
Eosinophils Relative: 3.7 % (ref 0.0–5.0)
HCT: 41.1 % (ref 36.0–46.0)
Hemoglobin: 14.2 g/dL (ref 12.0–15.0)
Lymphocytes Relative: 34.3 % (ref 12.0–46.0)
Lymphs Abs: 1.6 10*3/uL (ref 0.7–4.0)
MCHC: 34.6 g/dL (ref 30.0–36.0)
MCV: 95.8 fl (ref 78.0–100.0)
Monocytes Absolute: 0.4 10*3/uL (ref 0.1–1.0)
Monocytes Relative: 8.1 % (ref 3.0–12.0)
Neutro Abs: 2.5 10*3/uL (ref 1.4–7.7)
Neutrophils Relative %: 53 % (ref 43.0–77.0)
Platelets: 273 10*3/uL (ref 150.0–400.0)
RBC: 4.28 Mil/uL (ref 3.87–5.11)
RDW: 12.7 % (ref 11.5–15.5)
WBC: 4.7 10*3/uL (ref 4.0–10.5)

## 2024-12-25 LAB — COMPREHENSIVE METABOLIC PANEL WITH GFR
ALT: 22 U/L (ref 3–35)
AST: 31 U/L (ref 5–37)
Albumin: 4.6 g/dL (ref 3.5–5.2)
Alkaline Phosphatase: 54 U/L (ref 39–117)
BUN: 9 mg/dL (ref 6–23)
CO2: 29 meq/L (ref 19–32)
Calcium: 9.6 mg/dL (ref 8.4–10.5)
Chloride: 97 meq/L (ref 96–112)
Creatinine, Ser: 0.69 mg/dL (ref 0.40–1.20)
GFR: 92.44 mL/min
Glucose, Bld: 94 mg/dL (ref 70–99)
Potassium: 4.5 meq/L (ref 3.5–5.1)
Sodium: 129 meq/L — ABNORMAL LOW (ref 135–145)
Total Bilirubin: 0.6 mg/dL (ref 0.2–1.2)
Total Protein: 7.6 g/dL (ref 6.0–8.3)

## 2024-12-25 LAB — TSH: TSH: 1.35 u[IU]/mL (ref 0.35–5.50)

## 2024-12-25 MED ORDER — VALSARTAN 320 MG PO TABS: 320.0000 mg | ORAL_TABLET | Freq: Every day | ORAL | 3 refills | Status: AC

## 2024-12-25 NOTE — Patient Instructions (Signed)
 Please return in 6 months for hypertension follow up.  Today you were given your 1st of 2 Shingrix vaccinations. We will give you your second in 6 months.   I will release your lab results to you on your MyChart account with further instructions. You may see the results before I do, but when I review them I will send you a message with my report or have my assistant call you if things need to be discussed. Please reply to my message with any questions. Thank you!   Please schedule your pap smear appointment with Gyn and schedule a mammogram.  If you have any questions or concerns, please don't hesitate to send me a message via MyChart or call the office at [phone]. Thank you for visiting with us  today! It's our pleasure caring for you.
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
## 2024-12-27 ENCOUNTER — Ambulatory Visit: Payer: Self-pay | Admitting: Family Medicine

## 2024-12-27 DIAGNOSIS — E871 Hypo-osmolality and hyponatremia: Secondary | ICD-10-CM | POA: Insufficient documentation

## 2024-12-27 NOTE — Progress Notes (Signed)
 See mychart note The 10-year ASCVD risk score (Arnett DK, et al., 2019) is: 6.1%   Values used to calculate the score:     Age: 64 years     Clinically relevant sex: Female     Is Non-Hispanic African American: No     Diabetic: No     Tobacco smoker: No     Systolic Blood Pressure: 122 mmHg     Is BP treated: Yes     HDL Cholesterol: 57.7 mg/dL     Total Cholesterol: 245 mg/dL  Dear Kelly Wall, Your lab results have returned and show that your sodium level remains low. They have been low for almost a year. Has any of your other physicians commented on the cause?? Are you hydrating well? We will need to have you back in the office to get more blood work and urine studies to help identify the cause of this.  Your cholesterol levels have worsened as well. I know you eat a healthy diet so am not certain why they have worsened. We may need to discuss starting a cholesterol lowering medication in the future.  Your other lab results all look fine.   Sincerely, Dr. Jodie

## 2025-01-07 ENCOUNTER — Ambulatory Visit: Payer: Self-pay | Admitting: Physical Medicine & Rehabilitation

## 2025-06-24 ENCOUNTER — Ambulatory Visit: Admitting: Family Medicine

## 2025-12-28 ENCOUNTER — Encounter: Admitting: Family Medicine
